# Patient Record
Sex: Male | Born: 1960 | Race: White | Hispanic: No | Marital: Married | Smoking: Former smoker
Health system: Southern US, Community
[De-identification: ages and names within clinical notes are randomized; demographics above are authoritative.]

## PROBLEM LIST (undated history)

## (undated) DIAGNOSIS — T8140XA Infection following a procedure, unspecified, initial encounter: Secondary | ICD-10-CM

## (undated) DIAGNOSIS — K219 Gastro-esophageal reflux disease without esophagitis: Secondary | ICD-10-CM

## (undated) DIAGNOSIS — T8149XA Infection following a procedure, other surgical site, initial encounter: Secondary | ICD-10-CM

## (undated) DIAGNOSIS — J302 Other seasonal allergic rhinitis: Secondary | ICD-10-CM

## (undated) DIAGNOSIS — Z98811 Dental restoration status: Secondary | ICD-10-CM

## (undated) DIAGNOSIS — F4310 Post-traumatic stress disorder, unspecified: Secondary | ICD-10-CM

## (undated) HISTORY — PX: TONSILLECTOMY: SUR1361

## (undated) HISTORY — DX: Post-traumatic stress disorder, unspecified: F43.10

---

## 1995-04-23 HISTORY — PX: BACK SURGERY: SHX140

## 2000-12-17 ENCOUNTER — Emergency Department (HOSPITAL_COMMUNITY): Admission: EM | Admit: 2000-12-17 | Discharge: 2000-12-17 | Payer: Self-pay | Admitting: Emergency Medicine

## 2000-12-17 ENCOUNTER — Encounter: Payer: Self-pay | Admitting: Emergency Medicine

## 2000-12-25 ENCOUNTER — Emergency Department (HOSPITAL_COMMUNITY): Admission: EM | Admit: 2000-12-25 | Discharge: 2000-12-25 | Payer: Self-pay | Admitting: Emergency Medicine

## 2003-07-25 ENCOUNTER — Emergency Department (HOSPITAL_COMMUNITY): Admission: EM | Admit: 2003-07-25 | Discharge: 2003-07-25 | Payer: Self-pay | Admitting: Emergency Medicine

## 2003-07-28 ENCOUNTER — Emergency Department (HOSPITAL_COMMUNITY): Admission: EM | Admit: 2003-07-28 | Discharge: 2003-07-29 | Payer: Self-pay | Admitting: Emergency Medicine

## 2003-08-12 ENCOUNTER — Ambulatory Visit (HOSPITAL_COMMUNITY): Admission: RE | Admit: 2003-08-12 | Discharge: 2003-08-12 | Payer: Self-pay | Admitting: Family Medicine

## 2003-08-17 ENCOUNTER — Encounter (HOSPITAL_COMMUNITY): Admission: RE | Admit: 2003-08-17 | Discharge: 2003-09-16 | Payer: Self-pay | Admitting: Family Medicine

## 2003-09-20 ENCOUNTER — Encounter (HOSPITAL_COMMUNITY): Admission: RE | Admit: 2003-09-20 | Discharge: 2003-10-20 | Payer: Self-pay | Admitting: Family Medicine

## 2004-03-28 ENCOUNTER — Ambulatory Visit (HOSPITAL_COMMUNITY): Admission: RE | Admit: 2004-03-28 | Discharge: 2004-03-28 | Payer: Self-pay | Admitting: Neurosurgery

## 2006-06-11 ENCOUNTER — Ambulatory Visit (HOSPITAL_COMMUNITY): Admission: RE | Admit: 2006-06-11 | Discharge: 2006-06-11 | Payer: Self-pay | Admitting: Family Medicine

## 2006-06-13 ENCOUNTER — Ambulatory Visit (HOSPITAL_COMMUNITY): Admission: RE | Admit: 2006-06-13 | Discharge: 2006-06-13 | Payer: Self-pay | Admitting: Family Medicine

## 2010-10-31 ENCOUNTER — Other Ambulatory Visit (HOSPITAL_COMMUNITY): Payer: Self-pay | Admitting: Family Medicine

## 2010-10-31 DIAGNOSIS — M25569 Pain in unspecified knee: Secondary | ICD-10-CM

## 2010-11-02 ENCOUNTER — Ambulatory Visit (HOSPITAL_COMMUNITY)
Admission: RE | Admit: 2010-11-02 | Discharge: 2010-11-02 | Disposition: A | Discharge status: Home / Self Care | Payer: BC Managed Care – PPO | Source: Ambulatory Visit | Attending: Family Medicine | Admitting: Family Medicine

## 2010-11-02 DIAGNOSIS — R937 Abnormal findings on diagnostic imaging of other parts of musculoskeletal system: Secondary | ICD-10-CM | POA: Insufficient documentation

## 2010-11-02 DIAGNOSIS — M25569 Pain in unspecified knee: Secondary | ICD-10-CM | POA: Insufficient documentation

## 2010-11-02 DIAGNOSIS — S82109A Unspecified fracture of upper end of unspecified tibia, initial encounter for closed fracture: Secondary | ICD-10-CM | POA: Insufficient documentation

## 2010-11-21 HISTORY — PX: KNEE ARTHROSCOPY: SUR90

## 2011-08-26 HISTORY — PX: KNEE SURGERY: SHX244

## 2011-09-21 DIAGNOSIS — T8140XA Infection following a procedure, unspecified, initial encounter: Secondary | ICD-10-CM

## 2011-09-21 HISTORY — DX: Infection following a procedure, unspecified, initial encounter: T81.40XA

## 2011-10-07 ENCOUNTER — Encounter (HOSPITAL_BASED_OUTPATIENT_CLINIC_OR_DEPARTMENT_OTHER): Payer: Self-pay | Admitting: *Deleted

## 2011-10-08 ENCOUNTER — Ambulatory Visit (HOSPITAL_BASED_OUTPATIENT_CLINIC_OR_DEPARTMENT_OTHER): Payer: BC Managed Care – PPO | Admitting: Anesthesiology

## 2011-10-08 ENCOUNTER — Encounter (HOSPITAL_BASED_OUTPATIENT_CLINIC_OR_DEPARTMENT_OTHER): Payer: Self-pay | Admitting: *Deleted

## 2011-10-08 ENCOUNTER — Encounter (HOSPITAL_BASED_OUTPATIENT_CLINIC_OR_DEPARTMENT_OTHER)
Admission: RE | Disposition: A | Discharge status: Home / Self Care | Payer: Self-pay | Source: Ambulatory Visit | Attending: Orthopedic Surgery

## 2011-10-08 ENCOUNTER — Encounter (HOSPITAL_BASED_OUTPATIENT_CLINIC_OR_DEPARTMENT_OTHER): Payer: Self-pay | Admitting: Anesthesiology

## 2011-10-08 ENCOUNTER — Encounter (HOSPITAL_BASED_OUTPATIENT_CLINIC_OR_DEPARTMENT_OTHER): Payer: Self-pay | Admitting: Orthopedic Surgery

## 2011-10-08 ENCOUNTER — Ambulatory Visit (HOSPITAL_COMMUNITY)
Admission: RE | Admit: 2011-10-08 | Discharge: 2011-10-08 | Disposition: A | Discharge status: Home / Self Care | Payer: BC Managed Care – PPO | Source: Ambulatory Visit | Attending: Orthopedic Surgery | Admitting: Orthopedic Surgery

## 2011-10-08 ENCOUNTER — Other Ambulatory Visit (HOSPITAL_BASED_OUTPATIENT_CLINIC_OR_DEPARTMENT_OTHER): Payer: Self-pay | Admitting: Orthopedic Surgery

## 2011-10-08 ENCOUNTER — Ambulatory Visit (HOSPITAL_BASED_OUTPATIENT_CLINIC_OR_DEPARTMENT_OTHER)
Admission: RE | Admit: 2011-10-08 | Discharge: 2011-10-08 | Disposition: A | Discharge status: Home / Self Care | Payer: BC Managed Care – PPO | Source: Ambulatory Visit | Attending: Orthopedic Surgery | Admitting: Orthopedic Surgery

## 2011-10-08 DIAGNOSIS — Z792 Long term (current) use of antibiotics: Secondary | ICD-10-CM

## 2011-10-08 DIAGNOSIS — F17200 Nicotine dependence, unspecified, uncomplicated: Secondary | ICD-10-CM | POA: Insufficient documentation

## 2011-10-08 DIAGNOSIS — T8149XA Infection following a procedure, other surgical site, initial encounter: Secondary | ICD-10-CM | POA: Diagnosis present

## 2011-10-08 DIAGNOSIS — K219 Gastro-esophageal reflux disease without esophagitis: Secondary | ICD-10-CM | POA: Insufficient documentation

## 2011-10-08 DIAGNOSIS — T8183XA Persistent postprocedural fistula, initial encounter: Secondary | ICD-10-CM | POA: Insufficient documentation

## 2011-10-08 DIAGNOSIS — T8140XA Infection following a procedure, unspecified, initial encounter: Secondary | ICD-10-CM | POA: Insufficient documentation

## 2011-10-08 DIAGNOSIS — Y834 Other reconstructive surgery as the cause of abnormal reaction of the patient, or of later complication, without mention of misadventure at the time of the procedure: Secondary | ICD-10-CM | POA: Insufficient documentation

## 2011-10-08 HISTORY — DX: Other seasonal allergic rhinitis: J30.2

## 2011-10-08 HISTORY — DX: Dental restoration status: Z98.811

## 2011-10-08 HISTORY — DX: Infection following a procedure, other surgical site, initial encounter: T81.49XA

## 2011-10-08 HISTORY — DX: Infection following a procedure, unspecified, initial encounter: T81.40XA

## 2011-10-08 HISTORY — DX: Gastro-esophageal reflux disease without esophagitis: K21.9

## 2011-10-08 HISTORY — PX: INCISION AND DRAINAGE OF WOUND: SHX1803

## 2011-10-08 SURGERY — IRRIGATION AND DEBRIDEMENT WOUND
Anesthesia: General | Site: Leg Lower | Laterality: Left

## 2011-10-08 MED ORDER — VANCOMYCIN HCL 500 MG IV SOLR
500.0000 mg | Freq: Once | INTRAVENOUS | Status: AC
  Administered 2011-10-08: 500 mg via INTRAVENOUS

## 2011-10-08 MED ORDER — PROMETHAZINE HCL 25 MG/ML IJ SOLN: 6.2500 mg | INTRAMUSCULAR | Status: DC | PRN

## 2011-10-08 MED ORDER — EPHEDRINE SULFATE 50 MG/ML IJ SOLN
INTRAMUSCULAR | Status: DC | PRN
  Administered 2011-10-08: 5 mg via INTRAVENOUS
  Administered 2011-10-08: 10 mg via INTRAVENOUS

## 2011-10-08 MED ORDER — DEXAMETHASONE SODIUM PHOSPHATE 4 MG/ML IJ SOLN
INTRAMUSCULAR | Status: DC | PRN
  Administered 2011-10-08: 10 mg via INTRAVENOUS

## 2011-10-08 MED ORDER — FENTANYL CITRATE 0.05 MG/ML IJ SOLN
INTRAMUSCULAR | Status: DC | PRN
  Administered 2011-10-08 (×2): 50 ug via INTRAVENOUS

## 2011-10-08 MED ORDER — LACTATED RINGERS IV SOLN
INTRAVENOUS | Status: DC
  Administered 2011-10-08 (×2): via INTRAVENOUS

## 2011-10-08 MED ORDER — OXYCODONE-ACETAMINOPHEN 5-500 MG PO CAPS: 1.0000 | ORAL_CAPSULE | ORAL | Status: DC | PRN

## 2011-10-08 MED ORDER — PROMETHAZINE HCL 25 MG PO TABS: 25.0000 mg | ORAL_TABLET | Freq: Four times a day (QID) | ORAL | Status: DC | PRN

## 2011-10-08 MED ORDER — MIDAZOLAM HCL 5 MG/5ML IJ SOLN
INTRAMUSCULAR | Status: DC | PRN
  Administered 2011-10-08: 2 mg via INTRAVENOUS

## 2011-10-08 MED ORDER — VANCOMYCIN HCL IN DEXTROSE 1-5 GM/200ML-% IV SOLN: 1000.0000 mg | Freq: Once | INTRAVENOUS | Status: DC

## 2011-10-08 MED ORDER — PROPOFOL 10 MG/ML IV EMUL
INTRAVENOUS | Status: DC | PRN
  Administered 2011-10-08: 300 mg via INTRAVENOUS

## 2011-10-08 MED ORDER — OXYCODONE-ACETAMINOPHEN 5-325 MG PO TABS
2.0000 | ORAL_TABLET | Freq: Once | ORAL | Status: AC | PRN
  Administered 2011-10-08: 2 via ORAL

## 2011-10-08 MED ORDER — MEPERIDINE HCL 25 MG/ML IJ SOLN: 6.2500 mg | INTRAMUSCULAR | Status: DC | PRN

## 2011-10-08 MED ORDER — MIDAZOLAM HCL 2 MG/2ML IJ SOLN: 0.5000 mg | Freq: Once | INTRAMUSCULAR | Status: DC | PRN

## 2011-10-08 MED ORDER — VANCOMYCIN HCL 1000 MG IV SOLR: 1500.0000 mg | INTRAVENOUS | Status: DC

## 2011-10-08 MED ORDER — HYDROMORPHONE HCL PF 1 MG/ML IJ SOLN
0.2500 mg | INTRAMUSCULAR | Status: DC | PRN
  Administered 2011-10-08 (×2): 0.5 mg via INTRAVENOUS

## 2011-10-08 MED ORDER — SODIUM CHLORIDE 0.9 % IR SOLN
Status: DC | PRN
  Administered 2011-10-08: 6000 mL

## 2011-10-08 MED ORDER — VANCOMYCIN HCL IN DEXTROSE 1-5 GM/200ML-% IV SOLN
1000.0000 mg | INTRAVENOUS | Status: AC
  Administered 2011-10-08: 1000 mg via INTRAVENOUS

## 2011-10-08 MED ORDER — ONDANSETRON HCL 4 MG/2ML IJ SOLN
INTRAMUSCULAR | Status: DC | PRN
  Administered 2011-10-08: 4 mg via INTRAVENOUS

## 2011-10-08 MED ORDER — VANCOMYCIN HCL 1000 MG IV SOLR: 1000.0000 mg | Freq: Two times a day (BID) | INTRAVENOUS | Status: DC

## 2011-10-08 SURGICAL SUPPLY — 68 items
BANDAGE ELASTIC 4 VELCRO ST LF (GAUZE/BANDAGES/DRESSINGS) IMPLANT
BANDAGE ELASTIC 6 VELCRO ST LF (GAUZE/BANDAGES/DRESSINGS) ×1 IMPLANT
BANDAGE ESMARK 6X9 LF (GAUZE/BANDAGES/DRESSINGS) IMPLANT
BLADE SURG 15 STRL LF DISP TIS (BLADE) ×1 IMPLANT
BLADE SURG 15 STRL SS (BLADE) ×2
BNDG CMPR 9X4 STRL LF SNTH (GAUZE/BANDAGES/DRESSINGS)
BNDG CMPR 9X6 STRL LF SNTH (GAUZE/BANDAGES/DRESSINGS)
BNDG COHESIVE 4X5 TAN STRL (GAUZE/BANDAGES/DRESSINGS) ×1 IMPLANT
BNDG ESMARK 4X9 LF (GAUZE/BANDAGES/DRESSINGS) IMPLANT
BNDG ESMARK 6X9 LF (GAUZE/BANDAGES/DRESSINGS)
CANISTER SUCTION 1200CC (MISCELLANEOUS) ×1 IMPLANT
CLOTH BEACON ORANGE TIMEOUT ST (SAFETY) ×2 IMPLANT
COVER TABLE BACK 60X90 (DRAPES) IMPLANT
CUFF TOURNIQUET SINGLE 18IN (TOURNIQUET CUFF) IMPLANT
CUFF TOURNIQUET SINGLE 34IN LL (TOURNIQUET CUFF) IMPLANT
DECANTER SPIKE VIAL GLASS SM (MISCELLANEOUS) IMPLANT
DRAPE EXTREMITY T 121X128X90 (DRAPE) ×2 IMPLANT
DRAPE INCISE IOBAN 66X45 STRL (DRAPES) IMPLANT
DRAPE U 20/CS (DRAPES) ×1 IMPLANT
DRAPE U-SHAPE 47X51 STRL (DRAPES) ×2 IMPLANT
DRSG PAD ABDOMINAL 8X10 ST (GAUZE/BANDAGES/DRESSINGS) ×1 IMPLANT
DURAPREP 26ML APPLICATOR (WOUND CARE) ×2 IMPLANT
ELECT REM PT RETURN 9FT ADLT (ELECTROSURGICAL) ×2
ELECTRODE REM PT RTRN 9FT ADLT (ELECTROSURGICAL) ×1 IMPLANT
GAUZE XEROFORM 1X8 LF (GAUZE/BANDAGES/DRESSINGS) IMPLANT
GLOVE BIOGEL PI IND STRL 8 (GLOVE) ×2 IMPLANT
GLOVE BIOGEL PI INDICATOR 8 (GLOVE) ×2
GLOVE ECLIPSE 6.5 STRL STRAW (GLOVE) ×1 IMPLANT
GLOVE ORTHO TXT STRL SZ7.5 (GLOVE) ×2 IMPLANT
GLOVE SURG ORTHO 8.0 STRL STRW (GLOVE) ×2 IMPLANT
GOWN PREVENTION PLUS XLARGE (GOWN DISPOSABLE) ×4 IMPLANT
GOWN PREVENTION PLUS XXLARGE (GOWN DISPOSABLE) ×2 IMPLANT
HANDPIECE INTERPULSE COAX TIP (DISPOSABLE) ×2
IMMOBILIZER KNEE 24 THIGH 36 (MISCELLANEOUS) IMPLANT
IMMOBILIZER KNEE 24 UNIV (MISCELLANEOUS)
NEEDLE HYPO 22GX1.5 SAFETY (NEEDLE) IMPLANT
NS IRRIG 1000ML POUR BTL (IV SOLUTION) ×1 IMPLANT
PACK ARTHROSCOPY DSU (CUSTOM PROCEDURE TRAY) IMPLANT
PACK BASIN DAY SURGERY FS (CUSTOM PROCEDURE TRAY) ×2 IMPLANT
PACK ENT DAY SURGERY (CUSTOM PROCEDURE TRAY) ×1 IMPLANT
PADDING CAST COTTON 6X4 STRL (CAST SUPPLIES) ×2 IMPLANT
PENCIL BUTTON HOLSTER BLD 10FT (ELECTRODE) ×2 IMPLANT
SET HNDPC FAN SPRY TIP SCT (DISPOSABLE) IMPLANT
SHEET MEDIUM DRAPE 40X70 STRL (DRAPES) IMPLANT
SLEEVE SCD COMPRESS KNEE MED (MISCELLANEOUS) IMPLANT
SPONGE GAUZE 4X4 12PLY (GAUZE/BANDAGES/DRESSINGS) ×2 IMPLANT
SPONGE LAP 18X18 X RAY DECT (DISPOSABLE) ×1 IMPLANT
SPONGE LAP 4X18 X RAY DECT (DISPOSABLE) ×1 IMPLANT
STOCKINETTE 4X48 STRL (DRAPES) IMPLANT
STOCKINETTE 6  STRL (DRAPES)
STOCKINETTE 6 STRL (DRAPES) IMPLANT
STOCKINETTE IMPERVIOUS LG (DRAPES) ×1 IMPLANT
SUT ETHILON 3 0 PS 1 (SUTURE) ×1 IMPLANT
SUT MNCRL AB 4-0 PS2 18 (SUTURE) IMPLANT
SUT VIC AB 0 CT1 27 (SUTURE)
SUT VIC AB 0 CT1 27XBRD ANBCTR (SUTURE) IMPLANT
SUT VIC AB 0 SH 27 (SUTURE) IMPLANT
SUT VIC AB 2-0 SH 27 (SUTURE)
SUT VIC AB 2-0 SH 27XBRD (SUTURE) IMPLANT
SUT VICRYL 3-0 CR8 SH (SUTURE) IMPLANT
SWAB CULTURE LIQ STUART DBL (MISCELLANEOUS) ×1 IMPLANT
SYR BULB 3OZ (MISCELLANEOUS) IMPLANT
SYR CONTROL 10ML LL (SYRINGE) IMPLANT
TOWEL OR 17X24 6PK STRL BLUE (TOWEL DISPOSABLE) ×2 IMPLANT
TUBE ANAEROBIC SPECIMEN COL (MISCELLANEOUS) ×1 IMPLANT
TUBE CONNECTING 20X1/4 (TUBING) ×3 IMPLANT
UNDERPAD 30X30 INCONTINENT (UNDERPADS AND DIAPERS) ×2 IMPLANT
YANKAUER SUCT BULB TIP NO VENT (SUCTIONS) ×2 IMPLANT

## 2011-10-08 NOTE — Transfer of Care (Signed)
Immediate Anesthesia Transfer of Care Note  Patient: Peter Mahoney  Procedure(s) Performed: Procedure(s) (LRB): IRRIGATION AND DEBRIDEMENT WOUND (Left)  Patient Location: PACU  Anesthesia Type: General  Level of Consciousness: awake, alert  and oriented  Airway & Oxygen Therapy: Patient Spontanous Breathing and Patient connected to face mask oxygen  Post-op Assessment: Report given to PACU RN and Post -op Vital signs reviewed and stable  Post vital signs: Reviewed and stable  Complications: No apparent anesthesia complications

## 2011-10-08 NOTE — Anesthesia Preprocedure Evaluation (Signed)
Anesthesia Evaluation  Patient identified by MRN, date of birth, ID band Patient awake    Reviewed: Allergy & Precautions, H&P , NPO status , Patient's Chart, lab work & pertinent test results  History of Anesthesia Complications Negative for: history of anesthetic complications  Airway Mallampati: I TM Distance: >3 FB Neck ROM: Full    Dental No notable dental hx. (+) Teeth Intact and Dental Advisory Given   Pulmonary Current Smoker,  breath sounds clear to auscultation  Pulmonary exam normal       Cardiovascular negative cardio ROS  Rhythm:Regular Rate:Normal     Neuro/Psych negative neurological ROS     GI/Hepatic Neg liver ROS, GERD-  Medicated and Controlled,  Endo/Other  negative endocrine ROS  Renal/GU negative Renal ROS     Musculoskeletal   Abdominal   Peds  Hematology negative hematology ROS (+)   Anesthesia Other Findings   Reproductive/Obstetrics                           Anesthesia Physical Anesthesia Plan  ASA: II  Anesthesia Plan: General   Post-op Pain Management:    Induction: Intravenous  Airway Management Planned: LMA  Additional Equipment:   Intra-op Plan:   Post-operative Plan:   Informed Consent: I have reviewed the patients History and Physical, chart, labs and discussed the procedure including the risks, benefits and alternatives for the proposed anesthesia with the patient or authorized representative who has indicated his/her understanding and acceptance.   Dental advisory given  Plan Discussed with: CRNA and Surgeon  Anesthesia Plan Comments: (Plan routine monitors, GA- LMA OK)        Anesthesia Quick Evaluation

## 2011-10-08 NOTE — Procedures (Signed)
Successful image guided placement of SL PICC to (R)brachial vein 40cm length with tip at SVC/RA  Pt tolerated procedure well. Ready for use.  Brayton El PA-C 10/08/2011 3:16 PM

## 2011-10-08 NOTE — H&P (Signed)
PREOPERATIVE H&P  Chief Complaint: post op wound left leg  HPI: Peter Mahoney is a 51 y.o. male who presents for preoperative history and physical approximately 3 weeks after having had a left hamstring repair at the fibular head with decompression of the peroneal nerve. He has had drainage for about a week, which was cultured in the office and demonstrated MRSA. He complains of ongoing moderate to severe pain, as well as serous sanguinous drainage with some purulence.  Past Medical History  Diagnosis Date  . Seasonal allergies   . GERD (gastroesophageal reflux disease)   . Post-operative infection 09/2011    left leg  . Dental crown present    Past Surgical History  Procedure Date  . Knee arthroscopy 11/2010    left knee - meniscus tear  . Knee surgery 08/26/2011    hamstring tendon tear left  . Back surgery 04/1995    L5-S1  . Tonsillectomy     as a child   History   Social History  . Marital Status: Married    Spouse Name: N/A    Number of Children: N/A  . Years of Education: N/A   Social History Main Topics  . Smoking status: Current Everyday Smoker -- 1.0 packs/day for 30 years    Types: Cigarettes  . Smokeless tobacco: Never Used  . Alcohol Use: Yes     occasionally  . Drug Use: No  . Sexually Active:    Other Topics Concern  . None   Social History Narrative  . None   History reviewed. No pertinent family history. Allergies  Allergen Reactions  . Adhesive (Tape) Other (See Comments)    SKIN IRRITATION   Prior to Admission medications   Medication Sig Start Date End Date Taking? Authorizing Provider  acetaminophen (TYLENOL) 325 MG tablet Take 650 mg by mouth every 6 (six) hours as needed.   Yes Historical Provider, MD  cetirizine (ZYRTEC) 10 MG tablet Take 10 mg by mouth daily.   Yes Historical Provider, MD  omeprazole (PRILOSEC) 40 MG capsule Take 40 mg by mouth daily.   Yes Historical Provider, MD  oxyCODONE-acetaminophen (TYLOX) 5-500 MG per capsule  Take 1 capsule by mouth every 4 (four) hours as needed.   Yes Historical Provider, MD     Positive ROS: All other systems have been reviewed and were otherwise negative with the exception of those mentioned in the HPI and as above.  Physical Exam: General: Alert, no acute distress Cardiovascular: No pedal edema Respiratory: No cyanosis, no use of accessory musculature GI: No organomegaly, abdomen is soft and non-tender Skin: No lesions in the area of chief complaint Neurologic: Sensation intact distally Psychiatric: Patient is competent for consent with normal mood and affect Lymphatic: No axillary or cervical lymphadenopathy  MUSCULOSKELETAL: Left knee has a wound that has erythema, with a sinus tract and some purulent drainage.  Assessment: post op wound infection, left leg  Plan: Plan for Procedure(s): IRRIGATION AND DEBRIDEMENT WOUND, skin, subcutaneous tissue, muscle, possible bone  The risks benefits and alternatives were discussed with the patient including but not limited to the risks of nonoperative treatment, versus surgical intervention including infection, bleeding, nerve injury,  blood clots, cardiopulmonary complications, morbidity, mortality, among others, and they were willing to proceed. We've also discussed the risks of osteomyelitis, the need for revision surgery, among others. We will also be planning for a PICC line, and IV antibiotics in the form of vancomycin which will begin today preoperatively. We have been  working to find a home health agency in his area that can help manage his antibiotics.  Eulas Post, MD 10/08/2011 7:28 AM

## 2011-10-08 NOTE — Op Note (Signed)
10/08/2011  10:57 AM  PATIENT:  Peter Mahoney    PRE-OPERATIVE DIAGNOSIS:  Infected left lateral knee wound  POST-OPERATIVE DIAGNOSIS:  Same  PROCEDURE:  IRRIGATION AND DEBRIDEMENT, SKIN, SUBCUTANEOUS TISSUE, MUSCLE, TENDON, LEFT LATERAL KNEE WOUND, 8 CM  SURGEON:  Eulas Post, MD  PHYSICIAN ASSISTANT: Janace Litten, OPA-C, present and scrubbed throughout the case, critical for completion in a timely fashion, and for retraction, instrumentation, and closure.  ANESTHESIA:   General  PREOPERATIVE INDICATIONS:  Peter Mahoney is a  51 y.o. male who had a previous surgical intervention with repair of a hamstring tendon and placement of a anchor into the fibular head with FiberWire in place, as well as peroneal nerve decompression done by Dr. Eulah Pont approximately 6 weeks ago, who subsequently developed infection that was draining out of his wound. He had cultures done in the office, that appeared to be oxacillin resistant staph aureus. He had ongoing drainage from his wound, as well as the development of a sinus tract. He was brought to surgery for treatment of his infection, and to try and prevent the development of osteomyelitis. He has also been ordered to have a PICC line placed, IV vancomycin administered, and I will also obtain a infectious disease consultation.  The risks benefits and alternatives were discussed with the patient preoperatively including but not limited to the risks of infection, bleeding, nerve injury, cardiopulmonary complications, the need for revision surgery, among others, and the patient was willing to proceed.  OPERATIVE IMPLANTS: none  OPERATIVE FINDINGS: There was a sinus tract that tracked immediately down to a blue FiberWire, which did not appear connected to the anchor. This was immediately underneath the skin, and obviously contaminated, and so this was removed. There was also a tiger stripe FiberWire going up and down the hamstring tendon, which was concerning,  as it went down to the fibular head, and I assume this was connected to the anchor. The sinus tract appeared to have a tendency to burrow down towards the fibular head, during exploration, although I did not remove this FiberWire, nor did I removed the anchor, nor did I take down his lateral fibular complex to fully expose the tract. Instead, I debrided and curetted through the tract itself, trying to take care to maintain his anatomic structures as intact as possible, hoping that this debridement and curettage would achieve a cure, because if not, a much more radical exposure may be necessary. I did however excise the sinus tract at the level of the skin and subcutaneous tissue.  OPERATIVE PROCEDURE: The patient was brought to the operating room and placed in the supine position. General anesthesia was administered. IV vancomycin was administered. He was turned into the lateral decubitus position an axillary roll was placed along with all bony prominences padded. The left lower extremity was prepped and draped in usual sterile fashion. Tourniquet was not utilized. Time out was performed. Incision was made through his previous incision, and the sinus tract at the midportion of the wound was exposed and excised completely. I used a curette, as well as a rongeur to debride skin, subcutaneous tissue, tendon of the hamstrings, as well as the sinus tract going down to the fibular head. The skin was excised with a scalpel. Once I had completed the debridement, I irrigated a total of 6 L using pulse lavage through the wound. The skin was then closed loosely with nylon suture. I did remove one of the FiberWire sutures, which did not appear attached to  the anchor, but left the rest intact. Sterile dressing was applied. The patient was awakened and returned to PACU in stable and satisfactory condition. He will receive a PICC line today, as well as IV vancomycin with home health, and infectious disease consultation. He may  need subsequent imaging studies of his fibula, including MRI, to evaluate for the involvement of possible osteomyelitis if his wound does not respond clinically.

## 2011-10-08 NOTE — Anesthesia Postprocedure Evaluation (Signed)
  Anesthesia Post-op Note  Patient: Peter Mahoney  Procedure(s) Performed: Procedure(s) (LRB): IRRIGATION AND DEBRIDEMENT WOUND (Left)  Patient Location: PACU  Anesthesia Type: General  Level of Consciousness: awake, alert  and oriented  Airway and Oxygen Therapy: Patient Spontanous Breathing  Post-op Pain: none  Post-op Assessment: Post-op Vital signs reviewed, Patient's Cardiovascular Status Stable, Respiratory Function Stable, Patent Airway, No signs of Nausea or vomiting and Pain level controlled  Post-op Vital Signs: Reviewed and stable  Complications: No apparent anesthesia complications

## 2011-10-08 NOTE — Discharge Instructions (Signed)
Drug Monitoring, Therapeutic Therapeutic drug monitoring is used for measuring the level of some medications as a way to determine the most effective dose and or to avoid toxicity. WHY IS IT IMPORTANT? The drugs that are monitored have some special features. Most of them work best over a small range. Below this range, the drug is not effective and the patient begins having symptoms again; above this range, the drug has bad or toxic side effects that you want to avoid. Since many people take more than one medication, there may be interactions between the drugs that affect the way the body absorbs or metabolizes one of them. Also, some patients do not take medications as prescribed or instructed. Monitoring identifies these cases. WHAT DRUGS ARE MONITORED? There are several categories of drugs that require monitoring. The following are some of those drugs. DRUG CATEGORY: Cardiac drugs  DRUGS IN THAT CATEGORY: Digoxin, quinidine, procainamide, N-acetyl-procainamide (a metabolite of procainamide)   TREATMENT USE: Heart failure, angina, arrhythmias  DRUG CATEGORY: Antibiotics  DRUGS IN THAT CATEGORY: Aminoglycosides (gentamicin, tobramycin, amikacin) Vancomycin, Chloramphenicol   TREATMENT USE: Infections with bacteria that are resistant to less toxic antibiotics  DRUG CATEGORY: Antiepileptics  DRUGS IN THAT CATEGORY: Phenobarbital, phenytoin, valproic acid, carbamazepine, ethosuximide, sometimes gabapentin, lamotrigine   TREATMENT USE: Epilepsy, prevention of seizures  DRUG CATEGORY: Bronchodilators  DRUGS IN THAT CATEGORY: Theophylline, caffeine   TREATMENT USE: Asthma, Chronic obstructive pulmonary disorder (COPD), neonatal apnea  DRUG CATEGORY: Immunosuppressants  DRUGS IN THAT CATEGORY: Cyclosporine, tacrolimus, sometimes sirolimus, mycophenolate mofetile   TREATMENT USE: Prevent rejection of transplanted organs  DRUG CATEGORY: Anti-cancer drugs  DRUGS IN THAT CATEGORY: Methotrexate     TREATMENT USE: Various cancers, rheumatoid arthritis, non-hodgkin's lymphomas, osteosarcoma, psoriasis  DRUG CATEGORY: Psychiatric drugs  DRUGS IN THAT CATEGORY: Lithium, desipramine, some antidepressants (imipramine, amitriptyline, nortriptyline, doxepin)   TREATMENT USE: bipolar disorder (manic depression), depression  HOW DOES TDM WORK? Through years of testing, the optimum blood level range for each drug has been determined. (Remember, though, that each of [place] is different and the best drug level for one person may be different from another person.) For most of the drugs listed, blood is drawn right before a dose is given. For some drugs, the blood collection will take place after the dose is given at a specific time (for example, 90 minutes later); this time will vary and may be as long as 8 hours after dosing). Drawing the blood at the correct time is very important. The amount of drug measured in the blood will be used to determine if a patient is getting the right amount of the medication and, in some cases, to calculate the amount of drug that will be given the next time. So, if you are scheduled to have your blood drawn for a drug level, make sure you know if you are to take the drug before or after the blood draw is done. If you forget and do the opposite (take the drug when you are supposed to wait), let someone in the caregiver's office or lab know before the blood is collected. FAQ 1. How does my caregiver determine how much drug to give me?There are many factors to consider. Some of them are your weight, body composition, age, temperature, gastric movement, nutrition, renal, liver, and heart conditions, burns, shock, and trauma.  2. What should I do if I forget to take my medication on time?Do not double your dose the next time. Consult your caregiver or pharmacist to find  out what you should do.  3. Can I monitor myself at home?No. Usually blood must be collected at specific times and  tests done on special laboratory equipment.     Post Anesthesia Home Care Instructions  Activity: Get plenty of rest for the remainder of the day. A responsible adult should stay with you for 24 hours following the procedure.  For the next 24 hours, DO NOT: -Drive a car -Advertising copywriter -Drink alcoholic beverages -Take any medication unless instructed by your physician -Make any legal decisions or sign important papers.  Meals: Start with liquid foods such as gelatin or soup. Progress to regular foods as tolerated. Avoid greasy, spicy, heavy foods. If nausea and/or vomiting occur, drink only clear liquids until the nausea and/or vomiting subsides. Call your physician if vomiting continues.  Special Instructions/Symptoms: Your throat may feel dry or sore from the anesthesia or the breathing tube placed in your throat during surgery. If this causes discomfort, gargle with warm salt water. The discomfort should disappear within 24 hours.   Call your surgeon if you experience:   1.  Fever over 101.0. 2.  Inability to urinate. 3.  Nausea and/or vomiting. 4.  Extreme swelling or bruising at the surgical site. 5.  Continued bleeding from the incision. 6.  Increased pain, redness or drainage from the incision. 7.  Problems related to your pain medication.

## 2011-10-09 ENCOUNTER — Encounter (HOSPITAL_BASED_OUTPATIENT_CLINIC_OR_DEPARTMENT_OTHER): Payer: Self-pay | Admitting: Orthopedic Surgery

## 2011-10-11 LAB — WOUND CULTURE: Gram Stain: NONE SEEN

## 2011-10-11 LAB — POCT HEMOGLOBIN-HEMACUE: Hemoglobin: 15 g/dL (ref 13.0–17.0)

## 2011-10-13 LAB — ANAEROBIC CULTURE

## 2011-10-14 ENCOUNTER — Ambulatory Visit (INDEPENDENT_AMBULATORY_CARE_PROVIDER_SITE_OTHER): Payer: BC Managed Care – PPO | Admitting: Internal Medicine

## 2011-10-14 ENCOUNTER — Encounter: Payer: Self-pay | Admitting: Internal Medicine

## 2011-10-14 VITALS — BP 109/74 | HR 84 | Temp 97.4°F | Wt 168.4 lb

## 2011-10-14 DIAGNOSIS — S82143A Displaced bicondylar fracture of unspecified tibia, initial encounter for closed fracture: Secondary | ICD-10-CM | POA: Insufficient documentation

## 2011-10-14 DIAGNOSIS — T8140XA Infection following a procedure, unspecified, initial encounter: Secondary | ICD-10-CM

## 2011-10-14 DIAGNOSIS — K219 Gastro-esophageal reflux disease without esophagitis: Secondary | ICD-10-CM

## 2011-10-14 DIAGNOSIS — J309 Allergic rhinitis, unspecified: Secondary | ICD-10-CM

## 2011-10-14 DIAGNOSIS — T8149XA Infection following a procedure, other surgical site, initial encounter: Secondary | ICD-10-CM

## 2011-10-14 DIAGNOSIS — J302 Other seasonal allergic rhinitis: Secondary | ICD-10-CM | POA: Insufficient documentation

## 2011-10-14 DIAGNOSIS — S82109A Unspecified fracture of upper end of unspecified tibia, initial encounter for closed fracture: Secondary | ICD-10-CM

## 2011-10-14 LAB — C-REACTIVE PROTEIN: CRP: 0.4 mg/dL (ref <0.60)
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
## 2011-10-15 LAB — SEDIMENTATION RATE: Sed Rate: 5 mm/hr (ref 0–16)

## 2011-10-25 ENCOUNTER — Encounter: Payer: Self-pay | Admitting: Internal Medicine

## 2011-10-29 ENCOUNTER — Ambulatory Visit (INDEPENDENT_AMBULATORY_CARE_PROVIDER_SITE_OTHER): Payer: BC Managed Care – PPO | Admitting: Internal Medicine

## 2011-10-29 ENCOUNTER — Telehealth: Payer: Self-pay | Admitting: *Deleted

## 2011-10-29 ENCOUNTER — Encounter: Payer: Self-pay | Admitting: Internal Medicine

## 2011-10-29 VITALS — BP 125/88 | HR 62 | Temp 98.2°F | Ht 72.0 in | Wt 166.5 lb

## 2011-10-29 DIAGNOSIS — T8140XA Infection following a procedure, unspecified, initial encounter: Secondary | ICD-10-CM

## 2011-10-29 DIAGNOSIS — T8149XA Infection following a procedure, other surgical site, initial encounter: Secondary | ICD-10-CM

## 2011-10-29 NOTE — Telephone Encounter (Signed)
Lab report received from [facility] today.  Handwritten note on report requested call from [facility].  RN reviewed MD's notes from this morning's OV.  Notes reflected that [facility] would continue until MD reviews lab work drawn at [facility] today.  Phone call to [facility] and spoke with Okey Regal.  Shared MD's comments from this morning's OV.  Pharmacy Tech repeated back these comments.

## 2011-10-29 NOTE — Progress Notes (Signed)
Patient ID: Peter Mahoney, male   DOB: 1960-05-05, 51 y.o.   MRN: [id number]    [facility] for Infectious Disease  Patient Active Problem List  Diagnosis  . Wound infection after surgery, left lateral leg  . GERD (gastroesophageal reflux disease)  . Seasonal allergies  . Tibial plateau fracture    Patient's Medications  New Prescriptions   No medications on file  Previous Medications   CETIRIZINE (ZYRTEC) 10 MG TABLET    Take 10 mg by mouth daily.   OMEPRAZOLE (PRILOSEC) 40 MG CAPSULE    Take 40 mg by mouth daily.   OXYCODONE-ACETAMINOPHEN (TYLOX) 5-500 MG PER CAPSULE    Take 1 capsule by mouth every 4 (four) hours as needed.   PENICILLIN V POTASSIUM (VEETID) 500 MG TABLET    Take 500 mg by mouth 4 (four) times daily.   PROMETHAZINE (PHENERGAN) 25 MG TABLET    Take 1 tablet (25 mg total) by mouth every 6 (six) hours as needed for nausea.   SODIUM CHLORIDE 0.9 % SOLN 250 ML WITH VANCOMYCIN 1000 MG SOLR 1,000 MG    Inject 1,000 mg into the vein every 12 (twelve) hours.  Modified Medications   No medications on file  Discontinued Medications   No medications on file    Subjective: Peter Mahoney is in for his followup visit. 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 He has not had any problems tolerating the vancomycin or his PICC. He started physical therapy one week ago. Three days ago he began to notice a little bit of swelling above his left knee and laterally around his incision site. He has not had any fever, chills or sweats.  Objective: Temp: 98.2 F (36.8 C) (07/09 1133) Temp src: Oral (07/09 1133) BP: 125/88 mmHg (07/09 1133) Pulse Rate: 62  (07/09 1133)  General: Is in good spirits Skin: His right arm PICC site appears normal Left knee: Surgical incision laterally is well healed but there he is swelling around the incision site with some fluctuance. There is  no redness or warmth. It is nontender. He does have a little bit of swelling of his distal thigh as well.  Lab Results Lab Results  Component Value Date   CRP 0.40 10/14/2011    Lab Results  Component Value Date   ESRSEDRATE 5 10/14/2011      Assessment: I'm not sure if the swelling reflex any smoldering infection. It could be related to increased activity in his physical therapy. A repeat his inflammatory markers and consider stopping vancomycin soon. I suggested he contact Dr. Eulah Pont to get his thoughts about the no swelling.  Plan: 1. Consider stopping vancomycin soon 2. Repeat sedimentation rate and C-reactive protein   Cliffton Asters, MD [facility] for Infectious Disease [facility] [phone] pager   [phone] cell 10/29/2011, 11:50 AM

## 2011-10-30 ENCOUNTER — Telehealth: Payer: Self-pay | Admitting: *Deleted

## 2011-10-30 ENCOUNTER — Telehealth: Payer: Self-pay | Admitting: Internal Medicine

## 2011-10-30 NOTE — Telephone Encounter (Signed)
Order from Dr. Orvan Falconer to stop the Vancomycin and pull PICC called to [facility] Pharmacist.  Pharmacist read back the order.

## 2011-10-30 NOTE — Telephone Encounter (Signed)
I spoke with Mr. Douse today. He saw Dr. Richardson Landry yesterday he who attempted to aspirate fluid from his knee but was unsuccessful. He was then given a steroid injection. His sedimentation rate and C-reactive protein are normal her relatively low. I think it is best at this point to stop the vancomycin and remove the PICC. I've asked him to call me between now and his next visit if he has any new problems to indicate possible worsening infection.  Lab Results  Component Value Date   CRP 0.74* 10/29/2011    Lab Results  Component Value Date   ESRSEDRATE 4 10/29/2011    Cliffton Asters, MD [facility]ious Disease [facility] [phone] pager   [phone] cell 10/30/2011, 1:43 PM

## 2011-12-10 ENCOUNTER — Encounter: Payer: Self-pay | Admitting: Internal Medicine

## 2011-12-10 ENCOUNTER — Ambulatory Visit (INDEPENDENT_AMBULATORY_CARE_PROVIDER_SITE_OTHER): Payer: BC Managed Care – PPO | Admitting: Internal Medicine

## 2011-12-10 VITALS — BP 127/77 | HR 71 | Temp 97.9°F | Ht 72.0 in | Wt 169.5 lb

## 2011-12-10 DIAGNOSIS — H9319 Tinnitus, unspecified ear: Secondary | ICD-10-CM | POA: Insufficient documentation

## 2011-12-10 NOTE — Progress Notes (Signed)
Patient ID: Peter Mahoney, male   DOB: [birth year], 51 y.o.   MRN: [id number]    [facility] for Infectious Disease  Patient Active Problem List  Diagnosis  . Wound infection after surgery, left lateral leg  . GERD (gastroesophageal reflux disease)  . Seasonal allergies  . Tibial plateau fracture  . Tinnitus    Patient's Medications  New Prescriptions   No medications on file  Previous Medications   CETIRIZINE (ZYRTEC) 10 MG TABLET    Take 10 mg by mouth daily.   OMEPRAZOLE (PRILOSEC) 40 MG CAPSULE    Take 40 mg by mouth daily.   OXYCODONE-ACETAMINOPHEN (TYLOX) 5-500 MG PER CAPSULE    Take 1 capsule by mouth every 4 (four) hours as needed.   PENICILLIN V POTASSIUM (VEETID) 500 MG TABLET    Take 500 mg by mouth 4 (four) times daily.   PROMETHAZINE (PHENERGAN) 25 MG TABLET    Take 1 tablet (25 mg total) by mouth every 6 (six) hours as needed for nausea.  Modified Medications   No medications on file  Discontinued Medications   No medications on file    Subjective: This is he is in for his routine followup visit. He completed 3 weeks of IV vancomycin on July 10 for his methicillin-resistant coagulase-negative staph infection of his left knee. He continues to have some very mild swelling but overall feels that his knee is doing better. He is participating in physical therapy 3 times weekly.  He notes that about one week after stopping vancomycin he began to notice some ringing in his right ear. He states that it occurs only at night before he goes to bed. In the past he has had some very brief and mild episodes of tinnitus but this is more noticeable and persistent. He has not had any hearing loss or pain in his ear.  Objective: Temp: 97.9 F (36.6 C) (08/20 1523) Temp src: Oral (08/20 1523) BP: 127/77 mmHg (08/20 1523) Pulse Rate: 71  (08/20 1523)  General: He is in good spirits and in no distress Skin: He has some red papules on his legs that he states are probably mosquito  bites Ears: His left canal and tympanic membrane are entirely normal. His right canal is a little bit of cerumen. His right tympanic membrane appears normal Lungs: Clear Cor: Regular S1-S2 no murmurs Left knee: He has less swelling and no obvious effusion. His left lateral incision is healing nicely without any evidence of infection. He has a persistent nontender scar/nodule at the distal end of the incision.  Lab Results Lab Results  Component Value Date   CRP 0.74* 10/29/2011    Lab Results  Component Value Date   ESRSEDRATE 4 10/29/2011      Assessment: I suspect that his infection has now been cured. I will continue observation off of antibiotics. There is a remote possibility that his tinnitus is related to his recent vancomycin therapy but the timing of onset is a little unusual in that it began one week after completing vancomycin. I suggested that if it worsens or is associated with any hearing loss that he see an ear nose and throat physician for formal evaluation and audiology testing.  Plan: 1. Continue observation off of antibiotics 2. Followup here as needed   Cliffton Asters, MD [facility] for Infectious Disease [facility] [phone] pager   [phone] cell 12/10/2011, 3:50 PM

## 2012-11-03 ENCOUNTER — Other Ambulatory Visit (HOSPITAL_COMMUNITY): Payer: Self-pay | Admitting: Internal Medicine

## 2012-11-03 ENCOUNTER — Ambulatory Visit (HOSPITAL_COMMUNITY)
Admission: RE | Admit: 2012-11-03 | Discharge: 2012-11-03 | Disposition: A | Discharge status: Home / Self Care | Payer: BC Managed Care – PPO | Source: Ambulatory Visit | Attending: Internal Medicine | Admitting: Internal Medicine

## 2012-11-03 DIAGNOSIS — F17200 Nicotine dependence, unspecified, uncomplicated: Secondary | ICD-10-CM | POA: Insufficient documentation

## 2012-11-03 DIAGNOSIS — R059 Cough, unspecified: Secondary | ICD-10-CM | POA: Insufficient documentation

## 2012-11-03 DIAGNOSIS — R1314 Dysphagia, pharyngoesophageal phase: Secondary | ICD-10-CM

## 2012-11-03 DIAGNOSIS — R0602 Shortness of breath: Secondary | ICD-10-CM

## 2012-11-03 DIAGNOSIS — R05 Cough: Secondary | ICD-10-CM | POA: Insufficient documentation

## 2012-11-04 ENCOUNTER — Ambulatory Visit (HOSPITAL_COMMUNITY)
Admission: RE | Admit: 2012-11-04 | Discharge: 2012-11-04 | Disposition: A | Discharge status: Home / Self Care | Payer: BC Managed Care – PPO | Source: Ambulatory Visit | Attending: Internal Medicine | Admitting: Internal Medicine

## 2012-11-04 DIAGNOSIS — R1314 Dysphagia, pharyngoesophageal phase: Secondary | ICD-10-CM

## 2012-11-10 ENCOUNTER — Encounter: Payer: Self-pay | Admitting: Cardiology

## 2012-11-10 ENCOUNTER — Ambulatory Visit (INDEPENDENT_AMBULATORY_CARE_PROVIDER_SITE_OTHER): Payer: BC Managed Care – PPO | Admitting: Cardiology

## 2012-11-10 VITALS — BP 116/78 | HR 77 | Ht 72.0 in | Wt 175.3 lb

## 2012-11-10 DIAGNOSIS — R0989 Other specified symptoms and signs involving the circulatory and respiratory systems: Secondary | ICD-10-CM

## 2012-11-10 DIAGNOSIS — F17200 Nicotine dependence, unspecified, uncomplicated: Secondary | ICD-10-CM

## 2012-11-10 DIAGNOSIS — Z720 Tobacco use: Secondary | ICD-10-CM

## 2012-11-10 NOTE — Patient Instructions (Addendum)
Your physician has requested that you have en exercise stress myoview. For further information please visit [email or web address] Please follow instruction sheet, as given.  Your physician recommends that you have lab work  D-DIMER   .Your physician recommends that you schedule a follow-up appointment  After myoview.

## 2012-11-11 LAB — D-DIMER, QUANTITATIVE: D-Dimer, Quant: 0.27 ug/mL-FEU (ref 0.00–0.48)

## 2012-11-12 ENCOUNTER — Encounter: Payer: Self-pay | Admitting: Cardiology

## 2012-11-12 ENCOUNTER — Telehealth: Payer: Self-pay | Admitting: *Deleted

## 2012-11-12 DIAGNOSIS — F17210 Nicotine dependence, cigarettes, uncomplicated: Secondary | ICD-10-CM | POA: Insufficient documentation

## 2012-11-12 DIAGNOSIS — R0609 Other forms of dyspnea: Secondary | ICD-10-CM | POA: Insufficient documentation

## 2012-11-12 NOTE — Assessment & Plan Note (Signed)
Disability based on the nature of his visit with smoking the main focus. I did not spend as much time as the physician counseling with him as will be done in the future. I did mention that if he smoking a pack a day it would like him to try to cut it would not go into any details.

## 2012-11-12 NOTE — Telephone Encounter (Signed)
Left  Message,on phone to call back

## 2012-11-12 NOTE — Progress Notes (Signed)
Patient ID: Peter Mahoney, male   DOB: [birth year], 52 y.o.   MRN: [id number]  PCP: Cassell Smiles., MD  Clinic Note: Chief Complaint  Patient presents with  . New patient    referred by Dr Sherwood Gambler for SOB, started getting SOB the second week of june.    HPI: Peter Mahoney is a 52 y.o. 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  Interval History: He is date of consultation for his shortness of breath. He actually went to his primary's office to evaluate him for redness in his left thigh for which he take his antibiotics. But while there he noted about for 5 weeks of progressively worsening shortness of breath with exertion it started gradually and has gotten worse. The episodes of dyspnea we'll then lastly 15-30 minutes. It always with exertion is not arrest on. This all started probably he says in the mid June (he believes the 17th). He says that it usually was just with cath being active and in and and a tiny really exerting himself but now simply just walk across the floor at work. Will make her short of breath and he says that when he gets bad he does feel more pressure in his chest. He says intermittently occurs with less exertion but usually if he does a certain amount he can tell this getting it worse. He says that he doesn't feel lightheaded or dizzy except when his heart condition his breath. No palpitations or rapid heart beats. He just notes that he feels tired and fatigued as well. His symptom occurs lingers for 15+ minutes before it finally goes away.  He denies any PND, orthopnea or edema. No syncope/near-syncope the symptoms, or TIA /amaurosis fugax symptoms. No melena, hematochezia hematuria. He says he tracking cluster vessel a while ago and is made to feel horrible with aching all over. It  is his last check was quite good.  Past Medical History  Diagnosis Date  . Seasonal allergies   . GERD (gastroesophageal reflux disease)   . Post-operative infection 09/2011    left leg  . Dental crown present   . Wound infection after surgery, left lateral leg 10/08/2011   Prior Cardiac Evaluation: Reportedly several years ago had a negative stress test performed for palpitations while seen at the [place] office.  Past Surgical History: Past Surgical History  Procedure Laterality Date  . Knee arthroscopy  11/2010    left knee - meniscus tear  . Knee surgery  08/26/2011    hamstring tendon tear left  . Back surgery  04/1995    L5-S1  . Tonsillectomy      as a child  . Incision and drainage of wound  10/08/2011    Procedure: IRRIGATION AND DEBRIDEMENT WOUND;  Surgeon: Eulas Post, MD;  Location: [facility];  Service: Orthopedics;  Laterality: Left;  left left post op wound   Allergies  Allergen Reactions  . Adhesive (Tape) Other (See Comments)    SKIN IRRITATION    Current Outpatient Prescriptions  Medication Sig Dispense Refill  . Acetaminophen (TYLENOL 8 HOUR PO) Take 1 tablet by mouth as needed.      . doxycycline (VIBRAMYCIN) 100 MG capsule Take 100 mg by mouth 2 (two) times daily.      Marland Kitchen erythromycin ophthalmic ointment Place 1 application into  the left eye at bedtime.      Marland Kitchen omeprazole (PRILOSEC) 40 MG capsule Take 40 mg by mouth daily.       No current facility-administered medications for this visit.    History   Social History  . Marital Status: Married    Spouse Name: [name]    Number of Children: 2  . Years of Education: 12   Occupational History  .      [employer]   Social History Main Topics  . Smoking status: Current Every Day Smoker -- 1.00 packs/day for 30 years    Types: Cigarettes  . Smokeless tobacco: Never Used  . Alcohol Use: 1.0 oz/week    2 drink(s) per week     Comment: occasionally  . Drug Use: No  . Sexually  Active: Not on file   Other Topics Concern  . Not on file   Social History Narrative   Grandfather 3.    He works as a Passenger transport manager. 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 Recently he has been short of breath.   Family History  Problem Relation Age of Onset  . Heart Problems Mother   . Stroke Mother   . Cancer - Lung Father   . Heart failure Maternal Grandmother   . Emphysema Maternal Grandfather   . Diabetes Paternal Grandmother   . Cancer Paternal Grandfather    ROS: A comprehensive Review of Systems - Negative except Pertinent positives above with mild but notable positives below. General ROS: positive for  - weight loss negative for - chills, fatigue, fever or malaise ENT ROS: positive for - Redness and itching of the left eye. Gastrointestinal ROS: positive for - Nausea but without vomiting.decreased appetite with weight loss  PHYSICAL EXAM BP 116/78  Pulse 77  Ht 6' (1.829 m)  Wt 175 lb 4.8 oz (79.516 kg)  BMI 23.77 kg/m2 General appearance: alert, cooperative, appears stated age, no distress and Otherwise healthy-appearing. Pleasant mood and affect. Well-nourished well-groomed. He does smell of cigarette smoke. Neck: no adenopathy, no carotid bruit, no JVD, supple, symmetrical, trachea midline and thyroid not enlarged, symmetric, no tenderness/mass/nodules Lungs: clear to auscultation bilaterally, normal percussion bilaterally and Minimal interstitial sounds. no W./R.//R. Heart: regular rate and rhythm, S1, S2 normal, no murmur, click, rub or gallop and normal apical impulse Abdomen: soft, non-tender; bowel sounds normal; no masses,  no organomegaly Extremities: extremities normal, atraumatic, no cyanosis or edema Pulses: 2+ and symmetric Skin: Skin color, texture, turgor normal. No rashes or lesions Neurologic: Grossly normal HEENT: [place]/AT, EOMI, MMM, the left eye has conjunctival injection. The previously noted chalazion is no longer  notable.  [id number]:[id number] today: Yes Rate: 77  , Rhythm:  NSR, normal ECG.;    Recent Labs: As of December 2013: LDL 124, H&H 15.6 and 40.3.  ASSESSMENT: 52 year old gentleman with a main risk factor of smoking cover presents today with progressively worsening episodes of exertional dyspnea.   DOE (dyspnea on exertion) Am concerned that his exertional dyspnea for a will to be an anginal equivalent. Submitted is there is an association with mild mild chest tightness when he is currently breath. It could also be to excise-induced asthma with some probable likelihood of smoking-related Airways disease.  I simply think the being a 52 year old male smoker with exertional dyspnea, his risks of cardiac disease RBC intermediate, therefore would like to proceed with that treadmill Myoview stress test to evaluate for evidence of ischemia. At all see him back after  the study is performed. If high risk and would just simply proceed to the cardiac catheterization lab. I discussed his options with him.  At least 2 no his lipids are relatively well controlled in last check.  Tobacco abuse Disability based on the nature of his visit with smoking the main focus. I did not spend as much time as the physician counseling with him as will be done in the future. I did mention that if he smoking a pack a day it would like him to try to cut it would not go into any details.   PLAN: Per problem list. Orders Placed This Encounter  Procedures  . D-Dimer, Quantitative  . Myocardial Perfusion Imaging    Standing Status: Future     Number of Occurrences:      Standing Expiration Date: 11/10/2013    Order Specific Question:  Where should this test be performed    Answer:  [facility]    Order Specific Question:  Type of stress    Answer:  Exercise    Order Specific Question:  Patient weight in lbs    Answer:  175  . EKG 12-Lead   Followup: 2 weeks  DAVID W. Herbie Baltimore, M.D., M.S. [facility] [address] [place], [place]  [address]  [phone] Pager # [phone]

## 2012-11-12 NOTE — Telephone Encounter (Signed)
Message copied by Tobin Chad on Thu Nov 12, 2012 10:07 AM ------      Message from: [facility], DAVID      Created: Wed Nov 11, 2012  7:59 AM       This value is negative - makes a blood clot to the lungs less likely.            Marykay Lex, MD       ------

## 2012-11-12 NOTE — Assessment & Plan Note (Addendum)
Am concerned that his exertional dyspnea for a will to be an anginal equivalent. Submitted is there is an association with mild mild chest tightness when he is currently breath. It could also be to excise-induced asthma with some probable likelihood of smoking-related Airways disease.  I simply think the being a 52 year old male smoker with exertional dyspnea, his risks of cardiac disease RBC intermediate, therefore would like to proceed with that treadmill Myoview stress test to evaluate for evidence of ischemia. At all see him back after the study is performed. If high risk and would just simply proceed to the cardiac catheterization lab. I discussed his options with him.  At least 2 no his lipids are relatively well controlled in last check.

## 2012-11-12 NOTE — Telephone Encounter (Signed)
Returning your call. °

## 2012-11-16 NOTE — Telephone Encounter (Signed)
Spoke to patient . Result given. Voiced understanding

## 2012-11-17 ENCOUNTER — Ambulatory Visit (HOSPITAL_COMMUNITY)
Admission: RE | Admit: 2012-11-17 | Discharge: 2012-11-17 | Disposition: A | Discharge status: Home / Self Care | Payer: BC Managed Care – PPO | Source: Ambulatory Visit | Attending: Cardiovascular Disease | Admitting: Cardiovascular Disease

## 2012-11-17 DIAGNOSIS — R0609 Other forms of dyspnea: Secondary | ICD-10-CM | POA: Insufficient documentation

## 2012-11-17 DIAGNOSIS — F17200 Nicotine dependence, unspecified, uncomplicated: Secondary | ICD-10-CM | POA: Insufficient documentation

## 2012-11-17 DIAGNOSIS — R079 Chest pain, unspecified: Secondary | ICD-10-CM | POA: Insufficient documentation

## 2012-11-17 DIAGNOSIS — R5381 Other malaise: Secondary | ICD-10-CM | POA: Insufficient documentation

## 2012-11-17 DIAGNOSIS — R5383 Other fatigue: Secondary | ICD-10-CM | POA: Insufficient documentation

## 2012-11-17 DIAGNOSIS — Z8249 Family history of ischemic heart disease and other diseases of the circulatory system: Secondary | ICD-10-CM | POA: Insufficient documentation

## 2012-11-17 DIAGNOSIS — R0989 Other specified symptoms and signs involving the circulatory and respiratory systems: Secondary | ICD-10-CM

## 2012-11-17 HISTORY — PX: NM MYOVIEW LTD: HXRAD82

## 2012-11-17 MED ORDER — TECHNETIUM TC 99M SESTAMIBI GENERIC - CARDIOLITE
30.0000 | Freq: Once | INTRAVENOUS | Status: AC | PRN
  Administered 2012-11-17: 30 via INTRAVENOUS

## 2012-11-17 MED ORDER — TECHNETIUM TC 99M SESTAMIBI GENERIC - CARDIOLITE
10.0000 | Freq: Once | INTRAVENOUS | Status: AC | PRN
  Administered 2012-11-17: 10 via INTRAVENOUS

## 2012-11-17 NOTE — Procedures (Addendum)
[facility] [address] [place] [address] [phone]  Cardiology Nuclear Med Study  Peter Mahoney is a 52 y.o. male     MRN : [id number]     DOB: October 12, 1960  Procedure Date: 11/17/2012  Nuclear Med Background Indication for Stress Test:  Evaluation for Ischemia History:  no prior history reported Cardiac Risk Factors: Family History - CAD and Smoker  Symptoms:  Chest Pain, DOE, Fatigue and SOB   Nuclear Pre-Procedure Caffeine/Decaff Intake:  1:00am NPO After: 10AM   IV Site: R Hand  IV 0.9% NS with Angio Cath:  22g  Chest Size (in):  42"  IV Started by: Emmit Pomfret, RN  Height: 6' (1.829 m)  Cup Size: n/a  BMI:  Body mass index is 23.73 kg/(m^2). Weight:  175 lb (79.379 kg)   Tech Comments:  N/A    Nuclear Med Study 1 or 2 day study: 1 day  Stress Test Type:  Stress  Order Authorizing Provider:  Garrette Lemma, MD    Resting Radionuclide: Technetium 71m Sestamibi  Resting Radionuclide Dose: 10.2 mCi   Stress Radionuclide:  Technetium 59m Sestamibi  Stress Radionuclide Dose: 31.1 mCi           Stress Protocol Rest HR: 78 Stress HR: 166  Rest BP: 129/89 Stress BP: 179/84  Exercise Time (min): n/a METS: n/a   Predicted Max HR: 170 bpm % Max HR: 97.65 bpm Rate Pressure Product: 21308  Dose of Adenosine (mg):  n/a Dose of Lexiscan: n/a mg  Dose of Atropine (mg): n/a Dose of Dobutamine: n/a mcg/kg/min (at max HR)  Stress Test Technologist: Esperanza Sheets, CCT Nuclear Technologist: Koren Shiver, CNMT   Rest Procedure:  Myocardial perfusion imaging was performed at rest 45 minutes following the intravenous administration of Technetium 83m Sestamibi. Stress Procedure:  The patient performed treadmill exercise using a Bruce  Protocol for 7 minutes. The patient stopped due to SOB and Fatigue and denied any chest pain.  There were no significant ST-T wave changes.  Technetium 28m Sestamibi was injected at peak  exercise and myocardial perfusion imaging was performed after a brief delay.  Transient Ischemic Dilatation (Normal <1.22):  0.76 Lung/Heart Ratio (Normal <0.45):  0.35 QGS EDV:  87 ml QGS ESV:  35 ml LV Ejection Fraction: 60%  Signed by  Koren Shiver, CNMT  PHYSICIAN INTERPRETATION  Rest ECG: NSR with non-specific ST-T wave changes  Stress ECG: No significant change from baseline ECG, No significant ST segment change suggestive of ischemia. and Insignificant upsloping ST segment depression.  QPS Raw Data Images:  Mild diaphragmatic attenuation.  Normal left ventricular size.  Resting images have significant amount of tracer uptake in the splanchnic viscera Stress Images:  There is decreased uptake in the septum. There basically appears to be tracer drop-out in the basal septal wall; this does not necessarily correlate to a vascular distribution and is seen in both rest and stress images. Rest Images:  There is decreased uptake in the septum.   Comparison with the stress images reveals no significant change.  Subtraction (SDS):  There is no evidence of scar or ischemia.  Resting and stress images do not adequately line up.  Impression Exercise Capacity:  Good exercise capacity. BP Response:  Normal blood pressure response. Almost 20 beat per minute recovery in the first minute Clinical Symptoms:  There is dyspnea. and fatigue ECG Impression:  No significant ST segment change suggestive of ischemia. Comparison with Prior  Nuclear Study: No previous nuclear study performed  Overall Impression:  Normal stress nuclear study. and Low risk stress nuclear study with tracer drop out in the septal wall noted in both rest and stress images.  With normal septal wall motion, this is likely artifact..  LV Wall Motion:  NL LV Function; NL Wall Motion   HARDING,DAVID W, MD  11/17/2012 4:44 PM

## 2012-11-24 ENCOUNTER — Encounter: Payer: Self-pay | Admitting: Cardiology

## 2012-11-24 ENCOUNTER — Ambulatory Visit (INDEPENDENT_AMBULATORY_CARE_PROVIDER_SITE_OTHER): Payer: BC Managed Care – PPO | Admitting: Cardiology

## 2012-11-24 VITALS — BP 128/88 | HR 95 | Ht 72.0 in | Wt 171.9 lb

## 2012-11-24 DIAGNOSIS — F17200 Nicotine dependence, unspecified, uncomplicated: Secondary | ICD-10-CM

## 2012-11-24 DIAGNOSIS — Z720 Tobacco use: Secondary | ICD-10-CM

## 2012-11-24 MED ORDER — VARENICLINE TARTRATE 0.5 MG X 11 & 1 MG X 42 PO MISC: ORAL | Status: DC

## 2012-11-24 MED ORDER — VARENICLINE TARTRATE 1 MG PO TABS: 1.0000 mg | ORAL_TABLET | Freq: Two times a day (BID) | ORAL | Status: DC

## 2012-11-24 NOTE — Patient Instructions (Addendum)
Start CHANTIX  Your physician wants you to follow-up in 6 month Dr Herbie Baltimore. You will receive a reminder letter in the mail two months in advance. If you don't receive a letter, please call our office to schedule the follow-up appointment.

## 2012-12-06 ENCOUNTER — Encounter: Payer: Self-pay | Admitting: Cardiology

## 2012-12-06 NOTE — Progress Notes (Signed)
Patient ID: Peter Mahoney, male   DOB: Aug 19, 1960, 52 y.o.   MRN: [id number] PCP: Cassell Smiles., MD  Clinic Note: Chief Complaint  Patient presents with  . Follow-up    Recent nuclear stress test for shortness of breath on exertion    HPI: Peter Mahoney is a 52 y.o. male current smoker and contract employee at [employer], with no prior cardiac or cardiovascular history, but with a mild family history of a mother with atrial fibrillation, who presents today to followup on her nuclear stress test ordered during his initial visit on July 22. He noted gradually worsening dyspnea on exertion over last several months that were concerning for possible anginal equivalent. He underwent treadmill Myoview stress test during which he exercised 7 minutes and stopped due to shortness of breath. There were no scintigraphic evidence of any significant ischemia or infarction. EF was 60%.  Interval History: He still notes shortness of breath with exertion, but has noted that his become a little less pronounced. He continues to deny any chest discomfort with rest or exertion.  He hasn't had a pickup his level of activity, and is actively interested in smoking cessation. He denies any wheezing or significant coughing although he does cough some in the morning. He denies any palpitations, lightheadedness or dizziness, no wooziness. No syncope or near syncope. He denies any PND, orthopnea or edema. No syncope/near-syncope the symptoms, or TIA /amaurosis fugax symptoms. No melena, hematochezia hematuria.   Past Medical History  Diagnosis Date  . Seasonal allergies   . GERD (gastroesophageal reflux disease)   . Post-operative infection 09/2011    left leg  . Dental crown present   . Wound infection after surgery, left lateral leg 10/08/2011   Prior Cardiac Evaluation: Reportedly several years ago had a negative stress test performed for palpitations while seen at the [place] office. He also reports intolerance  to cholesterol medications. Past Surgical History: Past Surgical History  Procedure Laterality Date  . Knee arthroscopy  11/2010    left knee - meniscus tear  . Knee surgery  08/26/2011    hamstring tendon tear left  . Back surgery  04/1995    L5-S1  . Tonsillectomy      as a child  . Incision and drainage of wound  10/08/2011    Procedure: IRRIGATION AND DEBRIDEMENT WOUND;  Surgeon: Eulas Post, MD;  Location: [facility];  Service: Orthopedics;  Laterality: Left;  left left post op wound  . Nm myoview ltd  11/17/2012    Exercise 7 minutes. EF 60%. No ischemia or infarction.; Low risk   Allergies  Allergen Reactions  . Adhesive [Tape] Other (See Comments)    SKIN IRRITATION    Current Outpatient Prescriptions  Medication Sig Dispense Refill  . Acetaminophen (TYLENOL 8 HOUR PO) Take 1 tablet by mouth as needed.      Marland Kitchen omeprazole (PRILOSEC) 20 MG capsule Take 20 mg by mouth daily.      . varenicline (CHANTIX CONTINUING MONTH PAK) 1 MG tablet Take 1 tablet (1 mg total) by mouth 2 (two) times daily.  30 tablet  4  . varenicline (CHANTIX STARTING MONTH PAK) 0.5 MG X 11 & 1 MG X 42 tablet Take one 0.5 mg tablet by mouth once daily for 3 days, then increase to one 0.5 mg tablet twice daily for 4 days, then increase to one 1 mg tablet twice daily.  53 tablet  0   No current facility-administered medications  for this visit.   History   Social History Narrative   Married father of 2, Grandfather of 3. Wife's name is [name].   He works as a Passenger transport manager for [employer] 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   Currently smokes a pack a day, and drinks maybe 2-3 alcohol beverages a week.    Family History  Problem Relation Age of Onset  . Heart Problems Mother   . Stroke Mother   . Cancer - Lung Father   . Heart failure Maternal Grandmother   . Emphysema Maternal Grandfather   . Diabetes Paternal Grandmother   . Cancer Paternal Grandfather    ROS:  A comprehensive Review of Systems - Negative except Pertinent positives above with mild but notable positives below. General ROS: positive for  - weight loss Gastrointestinal ROS: positive for - Nausea less notable  PHYSICAL EXAM BP 128/88  Pulse 95  Ht 6' (1.829 m)  Wt 171 lb 14.4 oz (77.973 kg)  BMI 23.31 kg/m2 General appearance: A&Ox3, cooperative, appears stated age, no distress and Otherwise healthy-appearing. Pleasant mood and affect. Well-nourished well-groomed. He does smell of cigarette smoke. Neck: no adenopathy, no carotid bruit, no JVD, supple, symmetrical, trachea midline and thyroid not enlarged, symmetric, no tenderness/mass/nodules Lungs: CTAB, normal percussion bilaterally and Minimal interstitial sounds. no W./R.//R. Heart: RRR, S1, S2 normal, no murmur, click, rub or gallop and normal apical impulse Abdomen: soft, non-tender; bowel sounds normal; no masses,  no organomegaly Extremities: extremities normal, atraumatic, no cyanosis or edema Pulses: 2+ and symmetric Skin: Skin color, texture, turgor normal. No rashes or lesions Neurologic: Grossly normal HEENT: [place]/AT, EOMI, MMM, the left eye has conjunctival injection.  OZH:YQMVHQION today: Yes Rate: 77  , Rhythm:  NSR, normal ECG.;    Recent Labs: As of December 2013: LDL 124, H&H 15.6 and 40.3.  ASSESSMENT/PLAN: 52 year old gentleman with a main risk factor of smoking cover presents today with progressively worsening episodes of exertional dyspnea.  Evaluated with a nuclear stress test found to be negative for ischemia.   DOE (dyspnea on exertion) With Myoview negative for macrovascular ischemia, large vessel ischemia is unlikely to be the cause of his dyspnea. His symptoms are most certainly multifactorial including: Primary pulmonary issues with his long-term smoking, deconditioning, potentially microvascular disease.  At this point we'll continue with risk factor modification, continue exercise and smoking  cessation.  Tobacco abuse We talked about 7-8 minutes about the need to quit smoking. He asked about medication options. He was tear is not Chantix, he reviewed the on Monday return understood the potential adverse effects but is okay with going forward with using it.  Plan: Set quit date, prescribe Chantix for 2 months.   Cardiac risk factor modification will be deferred to patient's primary physician. His blood pressure is well-controlled. Would monitor glucose levels and lipids.  Followup: Six-month  Jonica Bickhart W. Herbie Baltimore, M.D., M.S. [facility] [address] [place], [place]  [address]  [phone] Pager # [phone]

## 2012-12-06 NOTE — Assessment & Plan Note (Signed)
With Myoview negative for macrovascular ischemia, large vessel ischemia is unlikely to be the cause of his dyspnea. His symptoms are most certainly multifactorial including: Primary pulmonary issues with his long-term smoking, deconditioning, potentially microvascular disease.  At this point we'll continue with risk factor modification, continue exercise and smoking cessation.

## 2012-12-06 NOTE — Assessment & Plan Note (Signed)
We talked about 7-8 minutes about the need to quit smoking. He asked about medication options. He was tear is not Chantix, he reviewed the on Monday return understood the potential adverse effects but is okay with going forward with using it.  Plan: Set quit date, prescribe Chantix for 2 months.

## 2013-02-25 ENCOUNTER — Other Ambulatory Visit: Payer: Self-pay

## 2015-05-02 NOTE — H&P (Signed)
  NTS SOAP Note  Vital Signs:  Vitals as of: 123456: Systolic Q000111Q: Diastolic 81: Heart Rate 75: Temp 97.58F (Temporal): Height 61ft 0in: Weight 174Lbs 0 Ounces: BMI 23.6   BMI : 23.6 kg/m2  Subjective: This 55 year old male presents for of need for screening TCS.  Never has had one.  No lower gi complaints.  No family h/o colon cancer.  Review of Symptoms:  Constitutional:unremarkable   Head:unremarkable Eyes:unremarkable   Nose/Mouth/Throat:unremarkable Cardiovascular:  unremarkable Respiratory:unremarkable Gastrointestinal:  unremarkable   Genitourinary:unremarkable   Musculoskeletal:unremarkable Skin:unremarkable Hematolgic/Lymphatic:unremarkable   Allergic/Immunologic:unremarkable   Past Medical History:  Reviewed  Past Medical History  Surgical History: knee surgeries Medical Problems: reflux Allergies: nkda Medications: aciphex   Social History:Reviewed  Social History  Preferred Language: English Race:  White Ethnicity: Not Hispanic / Latino Age: 56 year Marital Status:  M Alcohol: socially   Smoking Status: Current every day smoker reviewed on 05/02/2015 Started Date:  Packs per day: 1.00 Functional Status reviewed on 05/02/2015 ------------------------------------------------ Bathing: Normal Cooking: Normal Dressing: Normal Driving: Normal Eating: Normal Managing Meds: Normal Oral Care: Normal Shopping: Normal Toileting: Normal Transferring: Normal Walking: Normal Cognitive Status reviewed on 05/02/2015 ------------------------------------------------ Attention: Normal Decision Making: Normal Language: Normal Memory: Normal Motor: Normal Perception: Normal Problem Solving: Normal Visual and Spatial: Normal   Family History:Reviewed  Family Health History Mother, Deceased; Heart disease;  Father, Deceased; Cancer unspecified;     Objective Information: General:Well appearing, well nourished in no  distress. Heart:RRR, no murmur Lungs:  CTA bilaterally, no wheezes, rhonchi, rales.  Breathing unlabored. Abdomen:Soft, NT/ND, no HSM, no masses. deferred to procedure  Assessment:Need for screening TCS  Diagnoses: V76.51  Z12.11 Screening for malignant neoplasm of colon (Encounter for screening for malignant neoplasm of colon)  Procedures: [id number] - OFFICE OUTPATIENT NEW 20 MINUTES    Plan:  Scheduled for screening TCS on 05/12/15.   Patient Education:Alternative treatments to surgery were discussed with patient (and family).  Risks and benefits  of procedure incluidng bleeding and perforation were fully explained to the patient (and family) who gave informed consent. Patient/family questions were addressed.  Follow-up:Pending Surgery

## 2015-05-10 ENCOUNTER — Ambulatory Visit (HOSPITAL_COMMUNITY)
Admission: RE | Admit: 2015-05-10 | Discharge: 2015-05-10 | Disposition: A | Discharge status: Home / Self Care | Payer: BLUE CROSS/BLUE SHIELD | Source: Ambulatory Visit | Attending: Family Medicine | Admitting: Family Medicine

## 2015-05-10 ENCOUNTER — Other Ambulatory Visit (HOSPITAL_COMMUNITY): Payer: Self-pay | Admitting: Family Medicine

## 2015-05-10 DIAGNOSIS — Z0189 Encounter for other specified special examinations: Secondary | ICD-10-CM | POA: Diagnosis present

## 2015-05-10 DIAGNOSIS — R05 Cough: Secondary | ICD-10-CM | POA: Diagnosis not present

## 2015-05-10 DIAGNOSIS — Z0000 Encounter for general adult medical examination without abnormal findings: Secondary | ICD-10-CM | POA: Insufficient documentation

## 2015-05-10 DIAGNOSIS — R918 Other nonspecific abnormal finding of lung field: Secondary | ICD-10-CM | POA: Diagnosis not present

## 2015-05-12 ENCOUNTER — Encounter (HOSPITAL_COMMUNITY): Payer: Self-pay

## 2015-05-12 ENCOUNTER — Ambulatory Visit (HOSPITAL_COMMUNITY)
Admission: RE | Admit: 2015-05-12 | Discharge: 2015-05-12 | Disposition: A | Discharge status: Home / Self Care | Payer: BLUE CROSS/BLUE SHIELD | Source: Ambulatory Visit | Attending: General Surgery | Admitting: General Surgery

## 2015-05-12 ENCOUNTER — Encounter (HOSPITAL_COMMUNITY)
Admission: RE | Disposition: A | Discharge status: Home / Self Care | Payer: Self-pay | Source: Ambulatory Visit | Attending: General Surgery

## 2015-05-12 DIAGNOSIS — K219 Gastro-esophageal reflux disease without esophagitis: Secondary | ICD-10-CM | POA: Diagnosis not present

## 2015-05-12 DIAGNOSIS — Z1211 Encounter for screening for malignant neoplasm of colon: Secondary | ICD-10-CM | POA: Insufficient documentation

## 2015-05-12 DIAGNOSIS — Z79899 Other long term (current) drug therapy: Secondary | ICD-10-CM | POA: Diagnosis not present

## 2015-05-12 HISTORY — PX: COLONOSCOPY: SHX5424

## 2015-05-12 SURGERY — COLONOSCOPY
Anesthesia: Moderate Sedation

## 2015-05-12 MED ORDER — MIDAZOLAM HCL 5 MG/5ML IJ SOLN
INTRAMUSCULAR | Status: DC | PRN
  Administered 2015-05-12: 1 mg via INTRAVENOUS
  Administered 2015-05-12: 4 mg via INTRAVENOUS

## 2015-05-12 MED ORDER — SODIUM CHLORIDE 0.9 % IV SOLN
INTRAVENOUS | Status: DC
  Administered 2015-05-12: 07:00:00 via INTRAVENOUS

## 2015-05-12 MED ORDER — MEPERIDINE HCL 50 MG/ML IJ SOLN
INTRAMUSCULAR | Status: AC
  Filled 2015-05-12: qty 1

## 2015-05-12 MED ORDER — STERILE WATER FOR IRRIGATION IR SOLN
Status: DC | PRN
  Administered 2015-05-12: 2.5 mL

## 2015-05-12 MED ORDER — MIDAZOLAM HCL 5 MG/5ML IJ SOLN
INTRAMUSCULAR | Status: AC
  Filled 2015-05-12: qty 10

## 2015-05-12 MED ORDER — MEPERIDINE HCL 50 MG/ML IJ SOLN
INTRAMUSCULAR | Status: DC | PRN
  Administered 2015-05-12: 50 mg via INTRAVENOUS

## 2015-05-12 NOTE — Discharge Instructions (Signed)
Colonoscopy, Care After °Refer to this sheet in the next few weeks. These instructions provide you with information on caring for yourself after your procedure. Your health care provider may also give you more specific instructions. Your treatment has been planned according to current medical practices, but problems sometimes occur. Call your health care provider if you have any problems or questions after your procedure. °WHAT TO EXPECT AFTER THE PROCEDURE  °After your procedure, it is typical to have the following: °· A small amount of blood in your stool. °· Moderate amounts of gas and mild abdominal cramping or bloating. °HOME CARE INSTRUCTIONS °· Do not drive, operate machinery, or sign important documents for 24 hours. °· You may shower and resume your regular physical activities, but move at a slower pace for the first 24 hours. °· Take frequent rest periods for the first 24 hours. °· Walk around or put a warm pack on your abdomen to help reduce abdominal cramping and bloating. °· Drink enough fluids to keep your urine clear or pale yellow. °· You may resume your normal diet as instructed by your health care provider. Avoid heavy or fried foods that are hard to digest. °· Avoid drinking alcohol for 24 hours or as instructed by your health care provider. °· Only take over-the-counter or prescription medicines as directed by your health care provider. °· If a tissue sample (biopsy) was taken during your procedure: °¨ Do not take aspirin or blood thinners for 7 days, or as instructed by your health care provider. °¨ Do not drink alcohol for 7 days, or as instructed by your health care provider. °¨ Eat soft foods for the first 24 hours. °SEEK MEDICAL CARE IF: °You have persistent spotting of blood in your stool 2-3 days after the procedure. °SEEK IMMEDIATE MEDICAL CARE IF: °· You have more than a small spotting of blood in your stool. °· You pass large blood clots in your stool. °· Your abdomen is swollen  (distended). °· You have nausea or vomiting. °· You have a fever. °· You have increasing abdominal pain that is not relieved with medicine. °  °This information is not intended to replace advice given to you by your health care provider. Make sure you discuss any questions you have with your health care provider. °  °Document Released: 11/21/2003 Document Revised: 01/27/2013 Document Reviewed: 12/14/2012 °Elsevier Interactive Patient Education ©2016 Elsevier Inc. ° °

## 2015-05-12 NOTE — Op Note (Signed)
[facility] [address] [place], [address]   COLONOSCOPY PROCEDURE REPORT     EXAM DATE: 05/28/2015  PATIENT NAME:      Peter Mahoney, Peter Mahoney           MR #:      [id number] BIRTHDATE:       Feb 02, 1961      VISIT #:     [phone]  ATTENDING:     Aviva Signs, MD     STATUS:     outpatient ASSISTANT:  INDICATIONS:  The patient is a 55 yr old male here for a colonoscopy due to average risk patient for colon cancer. PROCEDURE PERFORMED:     Colonoscopy, screening MEDICATIONS:     Demerol 50 mg IV and Versed 5 mg IV ESTIMATED BLOOD LOSS:     None  CONSENT: The patient understands the risks and benefits of the procedure and understands that these risks include, but are not limited to: sedation, allergic reaction, infection, perforation and/or bleeding. Alternative means of evaluation and treatment include, among others: physical exam, x-rays, and/or surgical intervention. The patient elects to proceed with this endoscopic procedure.  DESCRIPTION OF PROCEDURE: During intra-op preparation period all mechanical & medical equipment was checked for proper function. Hand hygiene and appropriate measures for infection prevention was taken. After the risks, benefits and alternatives of the procedure were thoroughly explained, Informed consent was verified, confirmed and timeout was successfully executed by the treatment team. A digital exam revealed no abnormalities of the rectum. The [id number] endoscope was introduced through the anus and advanced to the cecum, which was identified by both the appendix and ileocecal valve. adequate (Trilyte was used) The instrument was then slowly withdrawn as the colon was fully examined.Estimated blood loss is zero unless otherwise noted in this procedure report.   COLON FINDINGS: A normal appearing cecum, ileocecal valve, and appendiceal orifice were identified.  The ascending, transverse, descending, sigmoid colon, and  rectum appeared unremarkable. Retroflexed views revealed no abnormalities. The scope was then completely withdrawn from the patient and the procedure terminated.  SCOPE WITHDRAWAL TIME: 6    ADVERSE EVENTS:      There were no immediate complications.  IMPRESSIONS:     Normal colonoscopy  RECOMMENDATIONS:     Repeat Colonscopy in 10 years. RECALL:  _____________________________ Aviva Signs, MD eSigned:  Aviva Signs, MD 05-28-2015 7:46 AM   cc:   CPT CODES: ICD CODES:  The ICD and CPT codes recommended by this software are interpretations from the data that the clinical staff has captured with the software.  The verification of the translation of this report to the ICD and CPT codes and modifiers is the sole responsibility of the health care institution and practicing physician where this report was generated.  [employer] will not be held responsible for the validity of the ICD and CPT codes included on this report.  AMA assumes no liability for data contained or not contained herein. CPT is a Designer, television/film set of the [employer].

## 2015-05-12 NOTE — Interval H&P Note (Signed)
History and Physical Interval Note:  05/12/2015 7:27 AM  Peter Mahoney  has presented today for surgery, with the diagnosis of screening  The various methods of treatment have been discussed with the patient and family. After consideration of risks, benefits and other options for treatment, the patient has consented to  Procedure(s): COLONOSCOPY (N/A) as a surgical intervention .  The patient's history has been reviewed, patient examined, no change in status, stable for surgery.  I have reviewed the patient's chart and labs.  Questions were answered to the patient's satisfaction.     Aviva Signs A

## 2015-05-17 ENCOUNTER — Encounter (HOSPITAL_COMMUNITY): Payer: Self-pay | Admitting: General Surgery

## 2015-05-19 ENCOUNTER — Encounter: Payer: Self-pay | Admitting: Internal Medicine

## 2015-05-19 ENCOUNTER — Ambulatory Visit (INDEPENDENT_AMBULATORY_CARE_PROVIDER_SITE_OTHER): Payer: BLUE CROSS/BLUE SHIELD | Admitting: Internal Medicine

## 2015-05-19 VITALS — BP 124/80 | HR 66 | Ht 72.0 in | Wt 179.0 lb

## 2015-05-19 DIAGNOSIS — F17210 Nicotine dependence, cigarettes, uncomplicated: Secondary | ICD-10-CM

## 2015-05-19 DIAGNOSIS — J8410 Pulmonary fibrosis, unspecified: Secondary | ICD-10-CM | POA: Diagnosis not present

## 2015-05-19 NOTE — Progress Notes (Signed)
Subjective:    Patient ID: Peter Mahoney, male    DOB: 1960-07-26,    MRN: [id number]  HPI  19 yowm active smoker with new doe x 2014 p knee surgery with / copd on cxr at that point referred to [facility] 05/19/2015  By Peter Mahoney with ? PF also   05/19/2015 1st Peter Mahoney office visit/ Peter Mahoney   Chief Complaint  Patient presents with  . Pulmonay Consult    Referred by Peter Mahoney for eval of abnormal cxr. Pt states cxr was done to eval SOB. He states he has had some SOB on and off since 2014. He has some cough in the am with clear sputum.   am cough x 5 min > minimal mucus= clear/ not worse in winter or particular seasons  Walks up to 2 miles a day all flat s difficulty  No exp to chemo/ amio or h/o connective tissue dz   No obvious other  day to day or daytime variabilty or assoc   cp or chest tightness, subjective wheeze or overt sinus symptoms. No unusual exp hx or h/o childhood pna/ asthma or knowledge of premature birth.  Sleeping ok without nocturnal  or early am exacerbation  of respiratory  c/o's or need for noct saba. Also denies any obvious fluctuation of symptoms with weather or environmental changes or other aggravating or alleviating factors except as outlined above   Current Medications, Allergies, Complete Past Medical History, Past Surgical History, Family History, and Social History were reviewed in [employer] record.            Review of Systems  Constitutional: Negative for fever, chills, activity change, appetite change and unexpected weight change.  HENT: Negative for congestion, dental problem, postnasal drip, rhinorrhea, sneezing, sore throat, trouble swallowing and voice change.   Eyes: Negative for visual disturbance.  Respiratory: Positive for cough and shortness of breath. Negative for choking.   Cardiovascular: Negative for chest pain and leg swelling.  Gastrointestinal: Negative for nausea, vomiting and  abdominal pain.  Genitourinary: Negative for difficulty urinating.       Acid Heartburn  Indigestion  Musculoskeletal: Negative for arthralgias.  Skin: Negative for rash.  Psychiatric/Behavioral: Negative for behavioral problems and confusion.       Objective:   Physical Exam  amb wm nad  Wt Readings from Last 3 Encounters:  05/19/15 179 lb (81.194 kg)  05/12/15 175 lb (79.379 kg)  11/24/12 171 lb 14.4 oz (77.973 kg)    Vital signs reviewed    HEENT: nl dentition, turbinates, and oropharynx. Nl external ear canals without cough reflex   NECK :  without JVD/Nodes/TM/ nl carotid upstrokes bilaterally   LUNGS: no acc muscle use,  Nl contour chest which is clear to A and P bilaterally without cough on insp or exp maneuvers   CV:  RRR  no s3 or murmur or increase in P2, no edema   ABD:  soft and nontender with nl inspiratory excursion in the supine position. No bruits or organomegaly, bowel sounds nl  MS:  Nl gait/ ext warm without deformities, calf tenderness, cyanosis or clubbing No obvious joint restrictions   SKIN: warm and dry without lesions    NEURO:  alert, approp, nl sensorium with  no motor deficits      I personally reviewed images and agree with radiology impression as follows:  CXR:  05/09/14 Diffuse bilateral Mahoney interstitial prominence. This may be related to chronic interstitial lung disease  however an active process including interstitial pneumonitis cannot be excluded .        Assessment & Plan:

## 2015-05-19 NOTE — Patient Instructions (Addendum)
The key is to stop smoking completely before smoking completely stops you!    Please schedule a follow up office visit in 6 weeks, call sooner if needed with pfts on return. Add make sure taking ppi qd ac not prn

## 2015-05-20 NOTE — Assessment & Plan Note (Addendum)
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   In active  smokers Langerhan's Cell  Histiocyctosis (eosinophilic granuomatosis),  DIP,  and Respiratory Bronchiolitis ILD also need to be considered,  And definitely apply here so critical that he stop smoking now.   Also, Use of PPI is associated with improved survival time and with decreased radiologic fibrosis per King's study published in [facility] vol 184 p1390.  Dec 2011 and also may have other beneficial effects as per the latest review in [place] vol 193 [id number] Jun 20016.  This may not always be cause and effect, but given how universally unimpressive and expensive  all the other  Drugs developed to day  have been for pf,   rec maintaint on  ppi / diet/ lifestyle modification and f/u with serial walking sats and lung volumes for now to put more points on the curve / establish firm baseline before considering additional measures.   Total time devoted to counseling  = 35/57m review case with pt/wife [name] and discussion of options/alternatives/ personally creating in presence of pt  then going over specific  Instructions directly with the pt including how to use all of the meds but in particular covering each new medication in detail (see avs)

## 2015-05-20 NOTE — Assessment & Plan Note (Signed)

## 2015-05-22 ENCOUNTER — Telehealth: Payer: Self-pay | Admitting: *Deleted

## 2015-05-22 NOTE — Telephone Encounter (Signed)
Spoke with pt. He is aware of how he needs to take his medication. Nothing further was needed.

## 2015-05-22 NOTE — Telephone Encounter (Signed)
LMTCB

## 2015-05-22 NOTE — Telephone Encounter (Signed)
-----   Message from Tanda Rockers, MD sent at 05/20/2015  7:42 AM EST ----- Be sure he understands he should use aciphex 20 mg Take 30-60 min before first meal of the day (not prn)

## 2015-05-22 NOTE — Telephone Encounter (Signed)
Pt returning call.Peter Mahoney ° °

## 2015-07-25 ENCOUNTER — Ambulatory Visit: Payer: BLUE CROSS/BLUE SHIELD | Admitting: Internal Medicine

## 2015-08-29 ENCOUNTER — Ambulatory Visit: Payer: BLUE CROSS/BLUE SHIELD | Admitting: Internal Medicine

## 2015-08-29 ENCOUNTER — Encounter: Payer: BLUE CROSS/BLUE SHIELD | Admitting: Internal Medicine

## 2015-10-23 ENCOUNTER — Other Ambulatory Visit: Payer: Self-pay | Admitting: Internal Medicine

## 2015-10-23 DIAGNOSIS — R0600 Dyspnea, unspecified: Secondary | ICD-10-CM

## 2015-10-25 ENCOUNTER — Encounter: Payer: Self-pay | Admitting: Internal Medicine

## 2015-10-25 ENCOUNTER — Ambulatory Visit (INDEPENDENT_AMBULATORY_CARE_PROVIDER_SITE_OTHER): Payer: BLUE CROSS/BLUE SHIELD | Admitting: Internal Medicine

## 2015-10-25 VITALS — BP 112/70 | HR 66 | Ht 71.0 in | Wt 174.0 lb

## 2015-10-25 DIAGNOSIS — J8410 Pulmonary fibrosis, unspecified: Secondary | ICD-10-CM

## 2015-10-25 DIAGNOSIS — R0600 Dyspnea, unspecified: Secondary | ICD-10-CM

## 2015-10-25 DIAGNOSIS — Z720 Tobacco use: Secondary | ICD-10-CM | POA: Diagnosis not present

## 2015-10-25 DIAGNOSIS — F17210 Nicotine dependence, cigarettes, uncomplicated: Secondary | ICD-10-CM

## 2015-10-25 LAB — PULMONARY FUNCTION TEST
DL/VA % pred: 55 %
DL/VA: 2.62 ml/min/mmHg/L
DLCO COR: 16.34 ml/min/mmHg
DLCO UNC % PRED: 49 %
DLCO cor % pred: 48 %
DLCO unc: 16.79 ml/min/mmHg
FEF 25-75 PRE: 2.87 L/s
FEF 25-75 Post: 2.84 L/sec
FEF2575-%Change-Post: -1 %
FEF2575-%PRED-PRE: 84 %
FEF2575-%Pred-Post: 83 %
FEV1-%CHANGE-POST: 0 %
FEV1-%PRED-POST: 90 %
FEV1-%PRED-PRE: 91 %
FEV1-POST: 3.6 L
FEV1-Pre: 3.61 L
FEV1FVC-%CHANGE-POST: 1 %
FEV1FVC-%PRED-PRE: 98 %
FEV6-%CHANGE-POST: -1 %
FEV6-%PRED-PRE: 94 %
FEV6-%Pred-Post: 93 %
FEV6-POST: 4.63 L
FEV6-PRE: 4.7 L
FEV6FVC-%Change-Post: 0 %
FEV6FVC-%PRED-POST: 103 %
FEV6FVC-%Pred-Pre: 103 %
FVC-%Change-Post: -1 %
FVC-%Pred-Post: 91 %
FVC-%Pred-Pre: 92 %
FVC-PRE: 4.75 L
FVC-Post: 4.68 L
POST FEV6/FVC RATIO: 99 %
PRE FEV1/FVC RATIO: 76 %
Post FEV1/FVC ratio: 77 %
Pre FEV6/FVC Ratio: 99 %
RV % pred: 89 %
RV: 1.94 L
TLC % PRED: 92 %
TLC: 6.61 L

## 2015-10-25 NOTE — Progress Notes (Signed)
PFT done today. 

## 2015-10-25 NOTE — Progress Notes (Signed)
Subjective:    Patient ID: Peter Mahoney, male    DOB: 20-Jan-1961,    MRN: [id number]    Brief patient profile:  75 yowm active smoker with new doe x 2014 p knee surgery with ?copd on cxr at that point referred to pulmonary clinic 05/19/2015  By Delman Cheadle with ? PF    History of Present Illness  05/19/2015 1st [facility] office visit/ Wert   Chief Complaint  Patient presents with  . Pulmonay Consult    Referred by Delman Cheadle for eval of abnormal cxr. Pt states cxr was done to eval SOB. He states he has had some SOB on and off since 2014. He has some cough in the am with clear sputum.   am cough x 5 min > minimal mucus= clear/ not worse in winter or particular seasons  Walks up to 2 miles a day all flat s difficulty nl / fast pace No exp to chemo/ amio or h/o connective tissue dz  rec The key is to stop smoking completely before smoking completely stops you!   Please schedule a follow up office visit in 6 weeks, call sooner if needed with pfts on return. Add make sure taking ppi qd ac not prn   10/25/2015  f/u ov/Wert re:   PF on aciphex 20 mg qam ac / still actively smokng  Chief Complaint  Patient presents with  . Follow-up    PFT done.   no am cough/ congestion  Walked at [place] with hills with  mild /mod dyspnea  but up to 30 min at a time without having to stop  No obvious day to day or daytime variability or assoc excess/ purulent sputum or mucus plugs or hemoptysis or cp or chest tightness, subjective wheeze or overt sinus or hb symptoms. No unusual exp hx or h/o childhood pna/ asthma or knowledge of premature birth.  Sleeping ok without nocturnal  or early am exacerbation  of respiratory  c/o's or need for noct saba. Also denies any obvious fluctuation of symptoms with weather or environmental changes or other aggravating or alleviating factors except as outlined above   Current Medications, Allergies, Complete Past Medical History, Past Surgical History,  Family History, and Social History were reviewed in [employer] record.  ROS  The following are not active complaints unless bolded sore throat, dysphagia, dental problems, itching, sneezing,  nasal congestion or excess/ purulent secretions, ear ache,   fever, chills, sweats, unintended wt loss, classically pleuritic or exertional cp,  orthopnea pnd or leg swelling, presyncope, palpitations, abdominal pain, anorexia, nausea, vomiting, diarrhea  or change in bowel or bladder habits, change in stools or urine, dysuria,hematuria,  rash, arthralgias, visual complaints, headache, numbness, weakness or ataxia or problems with walking or coordination,  change in mood/affect or memory.              Objective:   Physical Exam  amb wm nad  10/25/2015          174   05/19/15 179 lb (81.194 kg)  05/12/15 175 lb (79.379 kg)  11/24/12 171 lb 14.4 oz (77.973 kg)    Vital signs reviewed    HEENT: nl dentition, turbinates, and oropharynx. Nl external ear canals without cough reflex   NECK :  without JVD/Nodes/TM/ nl carotid upstrokes bilaterally   LUNGS: no acc muscle use,  Nl contour chest which is clear to A and P bilaterally without cough on insp or exp maneuvers  CV:  RRR  no s3 or murmur or increase in P2, no edema   ABD:  soft and nontender with nl inspiratory excursion in the supine position. No bruits or organomegaly, bowel sounds nl  MS:  Nl gait/ ext warm without deformities, calf tenderness, cyanosis or clubbing No obvious joint restrictions   SKIN: warm and dry without lesions    NEURO:  alert, approp, nl sensorium with  no motor deficits           Assessment & Plan:

## 2015-10-25 NOTE — Assessment & Plan Note (Addendum)
05/19/2015  Walked RA x 3 laps @ 185 ft each stopped due to  End of study, rapid  pace, no sob or desat    - PFT's  10/25/2015  VC  4.67 s obst with DLCO  49/48 % corrects to 55  % for alv volume   So clearly doesn't have copd but could well prove to have one of the other complications of smoking eg ILD, either RBILD or DIP > UIP favored - needs hrct to sort out  I had an extended discussion with the patient reviewing all relevant studies completed to date and  lasting 15 to 20 minutes of a 25 minute visit    Each maintenance medication was reviewed in detail including most importantly the difference between maintenance and prns and under what circumstances the prns are to be triggered using an action plan format that is not reflected in the computer generated alphabetically organized AVS.    Please see instructions for details which were reviewed in writing and the patient given a copy highlighting the part that I personally wrote and discussed at today's ov.

## 2015-10-25 NOTE — Patient Instructions (Addendum)
Please see patient coordinator before you leave today  to schedule HRCT chest   Continue aciphex 20 mg Take 30-60 min before first meal of the day   The key is to stop smoking completely before smoking completely stops you - consider the e cigarettes as a potential one way bridge off all tobacco products   Please schedule a follow up visit in 6  months but call sooner if needed with pfts on return

## 2015-10-26 NOTE — Assessment & Plan Note (Signed)
>   3 min Discussed the risks and costs (both direct and indirect)  of smoking relative to the benefits of quitting but patient unwilling to commit at this point to a specific quit date.    Although I don't endorse regular use of e cigs/ many pts find them helpful; however, I emphasized they should be considered a "one-way bridge" off all tobacco products.  

## 2015-10-30 ENCOUNTER — Ambulatory Visit (INDEPENDENT_AMBULATORY_CARE_PROVIDER_SITE_OTHER)
Admission: RE | Admit: 2015-10-30 | Discharge: 2015-10-30 | Disposition: A | Discharge status: Home / Self Care | Payer: BLUE CROSS/BLUE SHIELD | Source: Ambulatory Visit | Attending: Internal Medicine | Admitting: Internal Medicine

## 2015-10-30 DIAGNOSIS — J8410 Pulmonary fibrosis, unspecified: Secondary | ICD-10-CM

## 2015-10-31 NOTE — Progress Notes (Signed)
Quick Note:  Spoke with pt and notified of results per Dr. Wert. Pt verbalized understanding and denied any questions.  ______ 

## 2015-12-12 ENCOUNTER — Ambulatory Visit (INDEPENDENT_AMBULATORY_CARE_PROVIDER_SITE_OTHER): Payer: BLUE CROSS/BLUE SHIELD | Admitting: Cardiology

## 2015-12-12 ENCOUNTER — Encounter: Payer: Self-pay | Admitting: Cardiology

## 2015-12-12 VITALS — BP 126/74 | HR 64 | Ht 72.0 in | Wt 184.0 lb

## 2015-12-12 DIAGNOSIS — R0609 Other forms of dyspnea: Secondary | ICD-10-CM | POA: Diagnosis not present

## 2015-12-12 DIAGNOSIS — Z720 Tobacco use: Secondary | ICD-10-CM | POA: Diagnosis not present

## 2015-12-12 DIAGNOSIS — R0600 Dyspnea, unspecified: Secondary | ICD-10-CM

## 2015-12-12 DIAGNOSIS — G4730 Sleep apnea, unspecified: Secondary | ICD-10-CM | POA: Insufficient documentation

## 2015-12-12 DIAGNOSIS — R0683 Snoring: Secondary | ICD-10-CM

## 2015-12-12 DIAGNOSIS — F17210 Nicotine dependence, cigarettes, uncomplicated: Secondary | ICD-10-CM

## 2015-12-12 DIAGNOSIS — R5383 Other fatigue: Secondary | ICD-10-CM

## 2015-12-12 DIAGNOSIS — I25118 Atherosclerotic heart disease of native coronary artery with other forms of angina pectoris: Secondary | ICD-10-CM | POA: Diagnosis not present

## 2015-12-12 DIAGNOSIS — I2510 Atherosclerotic heart disease of native coronary artery without angina pectoris: Secondary | ICD-10-CM | POA: Insufficient documentation

## 2015-12-12 NOTE — Assessment & Plan Note (Signed)
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

## 2015-12-12 NOTE — Assessment & Plan Note (Signed)
Quit 6 weeks ago

## 2015-12-12 NOTE — Addendum Note (Signed)
Addended by: Therisa Doyne on: 12/12/2015 03:52 PM   Modules accepted: Orders

## 2015-12-12 NOTE — Progress Notes (Signed)
12/12/2015 Peter Mahoney   Nov 14, 1960  [id number]  Primary Physician Glo Herring., MD Primary Cardiologist: Dr Ellyn Hack  HPI:  55 y/o male with a history of paitations and COPD. He has had a low risk Myoview in 2013. He recently saw Dr Melvyn Novas for COPD and a chest CTA was performed. this revealed a lung nodule and Ca++ noted in LAD.  The pt became concerned once he saw this. He did some research and wanted to be seen. He denies any chest pain that sounds like angina but he does say he has progressive exertional fatigue and dyspnea.    Current Outpatient Prescriptions  Medication Sig Dispense Refill  . RABEprazole (ACIPHEX) 20 MG tablet Take 20 mg by mouth daily.     No current facility-administered medications for this visit.     Allergies  Allergen Reactions  . Doxycycline Nausea And Vomiting  . Adhesive [Tape] Other (See Comments)    SKIN IRRITATION    Social History   Social History  . Marital status: Married    Spouse name: Peter Mahoney  . Number of children: 2  . Years of education: 12   Occupational History  .  [employer]   Social History Main Topics  . Smoking status: Former Smoker    Packs/day: 1.00    Years: 35.00    Types: Cigarettes    Quit date: 11/01/2015  . Smokeless tobacco: Never Used  . Alcohol use 1.2 oz/week    2 Standard drinks or equivalent per week     Comment: occasionally  . Drug use: No  . Sexual activity: Not on file   Other Topics Concern  . Not on file   Social History Narrative   Married father of 2, Grandfather of 3. Wife's name is Peter Mahoney.   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   Currently smokes a pack a day, and drinks maybe 2-3 alcohol beverages a week.     Review of Systems: General: negative for chills, fever, night sweats or weight changes.  Cardiovascular: negative for chest pain, dyspnea on exertion, edema, orthopnea, palpitations, paroxysmal  nocturnal dyspnea or shortness of breath Dermatological: negative for rash Respiratory: negative for cough or wheezing Urologic: negative for hematuria Abdominal: negative for nausea, vomiting, diarrhea, bright red blood per rectum, melena, or hematemesis Neurologic: negative for visual changes, syncope, or dizziness All other systems reviewed and are otherwise negative except as noted above.    Blood pressure 126/74, pulse 64, height 6' (1.829 m), weight 184 lb (83.5 kg).  General appearance: alert, cooperative and no distress Neck: no carotid bruit and no JVD Lungs: clear to auscultation bilaterally Heart: regular rate and rhythm Abdomen: soft, non-tender; bowel sounds normal; no masses,  no organomegaly Extremities: extremities normal, atraumatic, no cyanosis or edema Pulses: 2+ and symmetric Skin: Skin color, texture, turgor normal. No rashes or lesions Neurologic: Grossly normal  EKG NSR  ASSESSMENT AND PLAN:   DOE (dyspnea on exertion) Pt c/o of progressive DOE-R/O anginal equivalent  Cigarette smoker Quit 6 weeks ago  CAD (coronary atherosclerotic disease) Ca++ LAD sen on CTA   PLAN  I suggested we proceed with a Myoview. He does not feel he is able to walk on a treadmill secondary to SOB. He had lipids done in Nov in [place] and I'll see if I can get those results. I also ordered a sleep study. F/U  Dr Ellyn Hack after the above tests.   Kerin Ransom PA-C 12/12/2015 3:42 PM

## 2015-12-12 NOTE — Patient Instructions (Signed)
Procedures  Your physician has requested that you have a lexiscan myoview. For further information please visit [email or web address]. Please follow instruction sheet, as given.   Your physician has recommended that you have a sleep study. This test records several body functions during sleep, including: brain activity, eye movement, oxygen and carbon dioxide blood levels, heart rate and rhythm, breathing rate and rhythm, the flow of air through your mouth and nose, snoring, body muscle movements, and chest and belly movement.   Follow-Up with Dr. Ellyn Hack after tests.

## 2015-12-12 NOTE — Assessment & Plan Note (Signed)
Pt c/o of progressive DOE

## 2015-12-19 ENCOUNTER — Telehealth (HOSPITAL_COMMUNITY): Payer: Self-pay | Admitting: *Deleted

## 2015-12-19 NOTE — Telephone Encounter (Signed)
Close encounter 

## 2015-12-21 ENCOUNTER — Ambulatory Visit (HOSPITAL_COMMUNITY)
Admission: RE | Admit: 2015-12-21 | Discharge: 2015-12-21 | Disposition: A | Discharge status: Home / Self Care | Payer: BLUE CROSS/BLUE SHIELD | Source: Ambulatory Visit | Attending: Cardiology | Admitting: Cardiology

## 2015-12-21 DIAGNOSIS — Z87891 Personal history of nicotine dependence: Secondary | ICD-10-CM | POA: Diagnosis not present

## 2015-12-21 DIAGNOSIS — J449 Chronic obstructive pulmonary disease, unspecified: Secondary | ICD-10-CM | POA: Diagnosis not present

## 2015-12-21 DIAGNOSIS — G4733 Obstructive sleep apnea (adult) (pediatric): Secondary | ICD-10-CM | POA: Diagnosis not present

## 2015-12-21 DIAGNOSIS — I25118 Atherosclerotic heart disease of native coronary artery with other forms of angina pectoris: Secondary | ICD-10-CM | POA: Insufficient documentation

## 2015-12-21 DIAGNOSIS — R5383 Other fatigue: Secondary | ICD-10-CM | POA: Insufficient documentation

## 2015-12-21 DIAGNOSIS — Z8249 Family history of ischemic heart disease and other diseases of the circulatory system: Secondary | ICD-10-CM | POA: Insufficient documentation

## 2015-12-21 DIAGNOSIS — R0609 Other forms of dyspnea: Secondary | ICD-10-CM | POA: Diagnosis not present

## 2015-12-21 MED ORDER — TECHNETIUM TC 99M TETROFOSMIN IV KIT
30.5000 | PACK | Freq: Once | INTRAVENOUS | Status: AC | PRN
  Administered 2015-12-21: 30.5 via INTRAVENOUS
  Filled 2015-12-21: qty 31

## 2015-12-21 MED ORDER — TECHNETIUM TC 99M TETROFOSMIN IV KIT
10.7000 | PACK | Freq: Once | INTRAVENOUS | Status: AC | PRN
  Administered 2015-12-21: 10.7 via INTRAVENOUS
  Filled 2015-12-21: qty 11

## 2015-12-21 MED ORDER — REGADENOSON 0.4 MG/5ML IV SOLN
0.4000 mg | Freq: Once | INTRAVENOUS | Status: AC
  Administered 2015-12-21: 0.4 mg via INTRAVENOUS

## 2015-12-22 LAB — MYOCARDIAL PERFUSION IMAGING
LV dias vol: 129 mL (ref 62–150)
LV sys vol: 69 mL
Peak HR: 82 {beats}/min
Rest HR: 56 {beats}/min
SDS: 3
SRS: 1
SSS: 4
TID: 1.32

## 2015-12-31 ENCOUNTER — Ambulatory Visit (HOSPITAL_BASED_OUTPATIENT_CLINIC_OR_DEPARTMENT_OTHER): Payer: BLUE CROSS/BLUE SHIELD | Attending: Cardiology | Admitting: Cardiovascular Disease

## 2015-12-31 VITALS — Ht 72.0 in | Wt 184.0 lb

## 2015-12-31 DIAGNOSIS — R5383 Other fatigue: Secondary | ICD-10-CM | POA: Insufficient documentation

## 2015-12-31 DIAGNOSIS — G4719 Other hypersomnia: Secondary | ICD-10-CM | POA: Diagnosis not present

## 2015-12-31 DIAGNOSIS — G4733 Obstructive sleep apnea (adult) (pediatric): Secondary | ICD-10-CM | POA: Insufficient documentation

## 2015-12-31 DIAGNOSIS — G4730 Sleep apnea, unspecified: Secondary | ICD-10-CM

## 2015-12-31 DIAGNOSIS — I493 Ventricular premature depolarization: Secondary | ICD-10-CM | POA: Diagnosis not present

## 2015-12-31 DIAGNOSIS — R0683 Snoring: Secondary | ICD-10-CM | POA: Insufficient documentation

## 2016-01-14 NOTE — Procedures (Signed)
     Patient Name: Peter Mahoney, Peter Mahoney Date: 12/31/2015 Gender: Male D.O.B: October 13, 1960 Age (years): 75 Referring Provider: Kerin Ransom Height (inches): 72 Interpreting Physician: Shelva Majestic MD, ABSM Weight (lbs): 184 RPSGT: Jonna Coup BMI: 25 MRN: [id number] Neck Size: 15.00  CLINICAL INFORMATION Sleep Study Type: NPSG Indication for sleep study: Daytime Fatigue, Fatigue, Snoring Epworth Sleepiness Score: 10  SLEEP STUDY TECHNIQUE As per the AASM Manual for the Scoring of Sleep and Associated Events v2.3 (April 2016) with a hypopnea requiring 4% desaturations. The channels recorded and monitored were frontal, central and occipital EEG, electrooculogram (EOG), submentalis EMG (chin), nasal and oral airflow, thoracic and abdominal wall motion, anterior tibialis EMG, snore microphone, electrocardiogram, and pulse oximetry.  MEDICATIONS RABEprazole (ACIPHEX) 20 MG tablet  Medications self-administered by patient during sleep study : No sleep medicine administered.  SLEEP ARCHITECTURE The study was initiated at 9:33:26 PM and ended at 4:33:48 AM. Sleep onset time was 67.5 minutes and the sleep efficiency was 67.9%. The total sleep time was 285.5 minutes. Stage REM latency was 74.5 minutes. The patient spent 2.45% of the night in stage N1 sleep, 79.16% in stage N2 sleep, 2.28% in stage N3 and 16.11% in REM. Alpha intrusion was absent. Supine sleep was 27.50%.  RESPIRATORY PARAMETERS The overall apnea/hypopnea index (AHI) was 0.2 per hour. There were 0 total apneas, including 0 obstructive, 0 central and 0 mixed apneas. There were 1 hypopneas and 2 RERAs. The AHI during Stage REM sleep was 1.3 per hour. AHI while supine was 0.8 per hour. The mean oxygen saturation was 91.66%. The minimum SpO2 during sleep was 89.00%. Moderate snoring was noted during this study.  CARDIAC DATA The 2 lead EKG demonstrated sinus rhythm. The mean heart rate was 62.93 beats per minute.  Other EKG findings include: rare PVC.  LEG MOVEMENT DATA The total PLMS were 1 with a resulting PLMS index of 0.21. Associated arousal with leg movement index was 0.2.  IMPRESSIONS - No significant obstructive sleep apnea occurred during this study (AHI = 0.2/h). - No significant central sleep apnea occurred during this study (CAI = 0.0/h). - Mild oxygen desaturation to a nadir of 89.00%. - Reduced sleep efficiency at 67.9% - The patient snored with Moderate snoring volume. - EKG findings include rare PVC. - Clinically significant periodic limb movements did not occur during sleep. No significant associated arousals.  DIAGNOSIS - Snoring - Excessive Daytime Sleepiness  RECOMMENDATIONS - There is no indication for CPAP therapy. - Efforts should be made to optimize nasal and oropharyngeal patency. - Consider alternatives for the treatment of snoring.  - Avoid alcohol, sedatives and other CNS depressants that may worsen sleep apnea and disrupt normal sleep architecture. - Sleep hygiene should be reviewed to assess factors that may improve sleep quality and duration.  [Electronically signed] 01/14/2016 08:34 PM  Shelva Majestic MD, [facility], ABSM Diplomate, American Board of Sleep Medicine   NPI: [id number]  [place] PH: [phone]   FX: [phone] [place]

## 2016-01-16 ENCOUNTER — Telehealth: Payer: Self-pay | Admitting: *Deleted

## 2016-01-16 NOTE — Progress Notes (Signed)
Left message to return a call to discuss sleep study results. 

## 2016-01-16 NOTE — Telephone Encounter (Signed)
Left message to return a call to discuss sleep study results. 

## 2016-01-17 ENCOUNTER — Telehealth: Payer: Self-pay | Admitting: Cardiovascular Disease

## 2016-01-17 NOTE — Telephone Encounter (Signed)
Peter Mahoney is calling to get Sleep results

## 2016-01-17 NOTE — Telephone Encounter (Signed)
RECOMMENDATIONS - There is no indication for CPAP therapy. - Efforts should be made to optimize nasal and oropharyngeal patency. - Consider alternatives for the treatment of snoring.  - Avoid alcohol, sedatives and other CNS depressants that may worsen sleep apnea and disrupt normal sleep architecture. - Sleep hygiene should be reviewed to assess factors that may improve sleep quality and duration.   [Electronically signed] 01/14/2016 08:34 PM   I called patient and gave recommendations. He voiced thanks and understanding. No further concerns or questions at this time.

## 2016-01-21 HISTORY — PX: TRANSTHORACIC ECHOCARDIOGRAM: SHX275

## 2016-01-23 NOTE — Progress Notes (Signed)
pt aware of results  

## 2016-01-26 ENCOUNTER — Encounter: Payer: Self-pay | Admitting: Cardiology

## 2016-01-26 ENCOUNTER — Ambulatory Visit (INDEPENDENT_AMBULATORY_CARE_PROVIDER_SITE_OTHER): Payer: BLUE CROSS/BLUE SHIELD | Admitting: Cardiology

## 2016-01-26 VITALS — BP 122/77 | HR 90 | Ht 72.0 in | Wt 186.6 lb

## 2016-01-26 DIAGNOSIS — I2510 Atherosclerotic heart disease of native coronary artery without angina pectoris: Secondary | ICD-10-CM | POA: Diagnosis not present

## 2016-01-26 DIAGNOSIS — N529 Male erectile dysfunction, unspecified: Secondary | ICD-10-CM | POA: Diagnosis not present

## 2016-01-26 DIAGNOSIS — F17210 Nicotine dependence, cigarettes, uncomplicated: Secondary | ICD-10-CM

## 2016-01-26 DIAGNOSIS — R0609 Other forms of dyspnea: Secondary | ICD-10-CM

## 2016-01-26 DIAGNOSIS — R9439 Abnormal result of other cardiovascular function study: Secondary | ICD-10-CM

## 2016-01-26 MED ORDER — SILDENAFIL CITRATE 100 MG PO TABS: 100.0000 mg | ORAL_TABLET | Freq: Every day | ORAL | 6 refills | Status: DC | PRN

## 2016-01-26 NOTE — Patient Instructions (Addendum)
MEDICATION VIAGRA 100 MG  MAY USE AS NEEDED ( CAN TAKE 1/2 TABLET IF NEEDED)   SCHEDULE AT [facility] [address] Your physician has requested that you have an echocardiogram. Echocardiography is a painless test that uses sound waves to create images of your heart. It provides your doctor with information about the size and shape of your heart and how well your heart's chambers and valves are working. This procedure takes approximately one hour. There are no restrictions for this procedure.   Your physician wants you to follow-up in: [place].  You will receive a reminder letter in the mail two months in advance. If you don't receive a letter, please call our office to schedule the follow-up appointment.  If you need a refill on your cardiac medications before your next appointment, please call your pharmacy.

## 2016-01-26 NOTE — Progress Notes (Signed)
PCP: Glo Herring., MD  Clinic Note: Chief Complaint  Patient presents with  . Shortness of Breath    pt states he is SOB more since he has stopped smoking since October 30, 2015, but no chest pain     HPI: Peter Mahoney is a 55 y.o. male with a PMH below who presents today for f/u for SOB/DOE & coronary Calcification on CTA chest. He is a Now former smoker and Occupational hygienist at [employer], with no prior cardiac or cardiovascular history, but with a mild family history of a mother with atrial fibrillation.    Peter Mahoney was last seen by me back in 11/2012 for DOE -- He was evaluated with a Nuclear ST - walked ~7 min & stopped 2/2 DOE.  NO ECG or imaging evidence of ischemia or infarct.   Saw Kerin Ransom on 12/12/15 -- ordered a [place] as a physiologic test to assess LAD calcification noted on a CTA chest ordered by Pulm MD --> Still has DOE.  Recent Hospitalizations: n/a  Studies Reviewed: Myoview & Sleep Study  Myoview:  The left ventricular ejection fraction is mildly decreased (45-54%).  Nuclear stress EF: 46%.  There was no ST segment deviation noted during stress.  The study is normal.  This is a low risk study.   Normal pharmacologic nuclear stress test with evidence for prior infarct or ischemia.  Mildly decreased LVEF but appears visually better than calculated, a correlation with an echocardiogram is recommended.   Sleep Study - results not able to be read  CTA chest - personally reviewed.  Ca noted on LAD.  Interval History: Peter Mahoney presents today still indicating that he has exertional dyspnea. He denies any resting or exertional chest tightness or pressure.. He he actually states that his dyspnea has gotten worse since he stopped smoking He denies any resting symptoms of PND orthopnea or edema.  He does note that he still has erectile dysfunction issues that he indicated back in 2014 as well. Difficult to attain and maintain an erection.  No  palpitations, lightheadedness, dizziness, weakness or syncope/near syncope. No TIA/amaurosis fugax symptoms. No melena, hematochezia, hematuria, or epstaxis. No claudication.  ROS: A comprehensive was performed. Review of Systems  Constitutional: Positive for malaise/fatigue (Tired because he is not able to do much). Negative for weight loss.  HENT: Negative for nosebleeds.   Respiratory: Positive for shortness of breath and wheezing. Negative for cough.   Cardiovascular: Negative for leg swelling.       Per history of present illness  Gastrointestinal: Negative for blood in stool, constipation, heartburn and melena.  Musculoskeletal: Negative for falls and joint pain.  Neurological: Negative for dizziness and headaches.  Psychiatric/Behavioral: Negative for depression. The patient is nervous/anxious (Just wants to know why he is short of breath).   All other systems reviewed and are negative.   Past Medical History:  Diagnosis Date  . Dental crown present   . GERD (gastroesophageal reflux disease)   . Post-operative infection 09/2011   left leg  . Seasonal allergies   . Wound infection after surgery, left lateral leg 10/08/2011    Past Surgical History:  Procedure Laterality Date  . BACK SURGERY  04/1995   L5-S1  . COLONOSCOPY N/A 05/12/2015   Procedure: COLONOSCOPY;  Surgeon: Aviva Signs, MD;  [facility];  Service: Gastroenterology;  Laterality: N/A;  . INCISION AND DRAINAGE OF WOUND  10/08/2011   Procedure: IRRIGATION AND DEBRIDEMENT WOUND;  Surgeon: Lenetta Quaker  Mardelle Matte, MD;  Location: [place];  Service: Orthopedics;  Laterality: Left;  left left post op wound  . KNEE ARTHROSCOPY  11/2010   left knee - meniscus tear  . KNEE SURGERY  08/26/2011   hamstring tendon tear left  . NM MYOVIEW LTD  11/17/2012   Exercise 7 minutes. EF 60%. No ischemia or infarction.; Low risk  . TONSILLECTOMY     as a child    Prior to Admission medications   Medication  Sig Start Date End Date Taking? Authorizing Provider  RABEprazole (ACIPHEX) 20 MG tablet Take 20 mg by mouth daily.   Yes Historical Provider, MD   Allergies  Allergen Reactions  . Doxycycline Nausea And Vomiting  . Adhesive [Tape] Other (See Comments)    SKIN IRRITATION    Social History   Social History  . Marital status: Married    Spouse name: [name]  . Number of children: 2  . Years of education: 12   Occupational History  .  [employer]    [employer]   Social History Main Topics  . Smoking status: Former Smoker    Packs/day: 1.00    Years: 35.00    Types: Cigarettes    Quit date: 11/01/2015  . Smokeless tobacco: Never Used  . Alcohol use 1.2 oz/week    2 Standard drinks or equivalent per week     Comment: occasionally  . Drug use: No  . Sexual activity: Not Asked   Other Topics Concern  . None   Social History Narrative   Married father of 2, Grandfather of 3. Wife's name is [name].   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   Currently smokes a pack a day, and drinks maybe 2-3 alcohol beverages a week.         Epworth Sleepiness Scale      Score: 9      --I seem to be losing my sex drive   --I feel stressed and lack motivation   --I have COPD   --I have been told that I snore   --I am overweight or am gaining weight   --I awake feeling not rested    Family History  Problem Relation Age of Onset  . Heart Problems Mother   . Stroke Mother   . Pulmonary fibrosis Mother   . Cancer - Lung Father     smoked  . Heart failure Maternal Grandmother   . Emphysema Maternal Grandfather     "his whole family had Emphysema"  . Diabetes Paternal Grandmother   . Cancer Paternal Grandfather     Wt Readings from Last 3 Encounters:  01/26/16 84.6 kg (186 lb 9.6 oz)  12/31/15 83.5 kg (184 lb)  12/12/15 83.5 kg (184 lb)    PHYSICAL EXAM BP 122/77   Pulse 90   Ht 6' (1.829 m)   Wt 84.6 kg (186 lb  9.6 oz)   SpO2 97%   BMI 25.31 kg/m  General appearance: alert, cooperative, appears stated age, no distress and Otherwise well-nourished well-groomed. Neck: no adenopathy, no carotid bruit and no JVD Lungs: clear to auscultation bilaterally, normal percussion bilaterally and non-labored Heart: regular rate and rhythm, S1 and S2 normal, no murmur, click, rub or gallop; nondisplaced PMI Abdomen: soft, non-tender; bowel sounds normal; no masses,  no organomegaly;  Extremities: extremities normal, atraumatic, no cyanosis, and edema - trivial Neurologic: Mental status: Alert,  oriented, thought content appropriate    Adult ECG Report - not checked.  Other studies Reviewed: Additional studies/ records that were reviewed today include:  Recent Labs:  n/a     ASSESSMENT / PLAN: Problem List Items Addressed This Visit    Erectile dysfunction    Rx Viagra (worked in the past)      Relevant Orders   ECHOCARDIOGRAM COMPLETE   DOE (dyspnea on exertion) - Primary (Chronic)    Progressive symptoms being followed by pulmonary and now with a recent negative Myoview. The monitor shows possibly reduced EF. There is some discrepancy between what the computer reads and the physician reading the study saw, I think is not a unreasonable consideration or an echocardiogram based on the reader's recommendations. This would allow [place] to confirm or deny presence of any cardiomyopathy but will also allow [place] to measure pulmonary pressures etc. Plan: 2-D echo      Relevant Orders   ECHOCARDIOGRAM COMPLETE   Coronary artery calcification seen on CAT scan (Chronic)    LAD calcification noted on CT scan. Myoview ordered showing no antigravity evidence of ischemia or infarction. I think is probably not flow-limiting. Caryl Comes however would recommend continued treatment of cardiac risk factors. This includes treatment of blood pressure, cholesterol and ensure smoking cessation. Also diet and exercise noted       Relevant Medications   sildenafil (VIAGRA) 100 MG tablet   Cigarette smoker    Quit ~2 months ago - congratulated his efforts.      Abnormal stress test    If reduction noted on Myoview. The computer read it is reduced EF, but the reader felt this was inaccurate. 2-D echocardiogram recommended.  Plan: Check 2-D echo      Relevant Orders   ECHOCARDIOGRAM COMPLETE    Other Visit Diagnoses   None.     Current medicines are reviewed at length with the patient today. (+/- concerns) Still has ED - ? Viagra  The following changes have been made:  VIAGRA 100 MG  MAY USE AS NEEDED ( CAN TAKE 1/2 TABLET IF NEEDED)  Studies Ordered:   Orders Placed This Encounter  Procedures  . ECHOCARDIOGRAM COMPLETE   Six-month follow-up   Glenetta Hew, M.D., M.S. Interventional Cardiologist   Pager # [phone] Phone # [phone] [address] [place], [place] [address]

## 2016-01-28 DIAGNOSIS — R9439 Abnormal result of other cardiovascular function study: Secondary | ICD-10-CM | POA: Insufficient documentation

## 2016-01-28 NOTE — Assessment & Plan Note (Signed)
Progressive symptoms being followed by pulmonary and now with a recent negative Myoview. The monitor shows possibly reduced EF. There is some discrepancy between what the computer reads and the physician reading the study saw, I think is not a unreasonable consideration or an echocardiogram based on the reader's recommendations. This would allow [place] to confirm or deny presence of any cardiomyopathy but will also allow [place] to measure pulmonary pressures etc. Plan: 2-D echo

## 2016-01-28 NOTE — Assessment & Plan Note (Signed)
If reduction noted on Myoview. The computer read it is reduced EF, but the reader felt this was inaccurate. 2-D echocardiogram recommended.  Plan: Check 2-D echo

## 2016-01-28 NOTE — Assessment & Plan Note (Signed)
LAD calcification noted on CT scan. Myoview ordered showing no antigravity evidence of ischemia or infarction. I think is probably not flow-limiting. Peter Mahoney however would recommend continued treatment of cardiac risk factors. This includes treatment of blood pressure, cholesterol and ensure smoking cessation. Also diet and exercise noted

## 2016-01-28 NOTE — Assessment & Plan Note (Signed)
Rx Viagra (worked in the past)

## 2016-01-28 NOTE — Assessment & Plan Note (Signed)
Quit ~2 months ago - congratulated his efforts.

## 2016-01-31 ENCOUNTER — Telehealth: Payer: Self-pay | Admitting: *Deleted

## 2016-01-31 NOTE — Telephone Encounter (Signed)
IN PROCESS FOR PRIOR AUTHORIZATION

## 2016-02-15 NOTE — Telephone Encounter (Signed)
Spoke to representative Case# [id number] Prior authorization completed over the phone   Dx :erectile dysfunction Patient not to use medication with nitrates  Quantity 10 per month  Approved until 10/26 /2018

## 2016-02-16 ENCOUNTER — Other Ambulatory Visit: Payer: Self-pay

## 2016-02-16 ENCOUNTER — Ambulatory Visit (HOSPITAL_COMMUNITY): Payer: BLUE CROSS/BLUE SHIELD | Attending: Cardiovascular Disease

## 2016-02-16 DIAGNOSIS — Z87891 Personal history of nicotine dependence: Secondary | ICD-10-CM | POA: Insufficient documentation

## 2016-02-16 DIAGNOSIS — R9439 Abnormal result of other cardiovascular function study: Secondary | ICD-10-CM | POA: Insufficient documentation

## 2016-02-16 DIAGNOSIS — R0609 Other forms of dyspnea: Secondary | ICD-10-CM | POA: Diagnosis not present

## 2016-02-16 DIAGNOSIS — N529 Male erectile dysfunction, unspecified: Secondary | ICD-10-CM | POA: Diagnosis not present

## 2016-04-08 ENCOUNTER — Other Ambulatory Visit: Payer: Self-pay | Admitting: Internal Medicine

## 2016-04-08 DIAGNOSIS — R918 Other nonspecific abnormal finding of lung field: Secondary | ICD-10-CM | POA: Insufficient documentation

## 2016-04-08 DIAGNOSIS — R911 Solitary pulmonary nodule: Secondary | ICD-10-CM | POA: Insufficient documentation

## 2016-04-17 ENCOUNTER — Ambulatory Visit (INDEPENDENT_AMBULATORY_CARE_PROVIDER_SITE_OTHER)
Admission: RE | Admit: 2016-04-17 | Discharge: 2016-04-17 | Disposition: A | Discharge status: Home / Self Care | Payer: BLUE CROSS/BLUE SHIELD | Source: Ambulatory Visit | Attending: Internal Medicine | Admitting: Internal Medicine

## 2016-04-17 DIAGNOSIS — R911 Solitary pulmonary nodule: Secondary | ICD-10-CM

## 2016-04-17 NOTE — Progress Notes (Signed)
Spoke with pt and notified of results per Dr. Wert. Pt verbalized understanding and denied any questions. 

## 2016-04-26 ENCOUNTER — Encounter: Payer: Self-pay | Admitting: Internal Medicine

## 2016-04-26 ENCOUNTER — Ambulatory Visit (INDEPENDENT_AMBULATORY_CARE_PROVIDER_SITE_OTHER): Payer: BLUE CROSS/BLUE SHIELD | Admitting: Internal Medicine

## 2016-04-26 VITALS — BP 114/70 | HR 74 | Ht 72.0 in | Wt 189.0 lb

## 2016-04-26 DIAGNOSIS — J8410 Pulmonary fibrosis, unspecified: Secondary | ICD-10-CM | POA: Diagnosis not present

## 2016-04-26 DIAGNOSIS — R911 Solitary pulmonary nodule: Secondary | ICD-10-CM

## 2016-04-26 LAB — PULMONARY FUNCTION TEST
DL/VA % PRED: 61 %
DL/VA: 2.88 ml/min/mmHg/L
DLCO COR: 17.95 ml/min/mmHg
DLCO UNC % PRED: 55 %
DLCO UNC: 18.62 ml/min/mmHg
DLCO cor % pred: 53 %
FEF 25-75 POST: 3.18 L/s
FEF 25-75 PRE: 2.88 L/s
FEF2575-%CHANGE-POST: 10 %
FEF2575-%PRED-POST: 94 %
FEF2575-%PRED-PRE: 85 %
FEV1-%CHANGE-POST: 2 %
FEV1-%Pred-Post: 91 %
FEV1-%Pred-Pre: 89 %
FEV1-Post: 3.62 L
FEV1-Pre: 3.53 L
FEV1FVC-%CHANGE-POST: 0 %
FEV1FVC-%PRED-PRE: 99 %
FEV6-%CHANGE-POST: 0 %
FEV6-%Pred-Post: 94 %
FEV6-%Pred-Pre: 93 %
FEV6-PRE: 4.61 L
FEV6-Post: 4.65 L
FEV6FVC-%CHANGE-POST: 0 %
FEV6FVC-%Pred-Post: 102 %
FEV6FVC-%Pred-Pre: 103 %
FVC-%Change-Post: 1 %
FVC-%Pred-Post: 91 %
FVC-%Pred-Pre: 90 %
FVC-Post: 4.71 L
POST FEV1/FVC RATIO: 77 %
PRE FEV1/FVC RATIO: 76 %
Post FEV6/FVC ratio: 99 %
Pre FEV6/FVC Ratio: 99 %
RV % pred: 87 %
RV: 1.9 L
TLC % pred: 93 %
TLC: 6.74 L

## 2016-04-26 NOTE — Patient Instructions (Addendum)
Congratulations on not smoking - it's the most important aspect of your care   We will be calling to schedule your follow up

## 2016-04-26 NOTE — Progress Notes (Signed)
Subjective:    Patient ID: Peter Mahoney, male    DOB: 1960/08/29,    MRN: [id number]    Brief patient profile:  55 yowm active smoker with new doe x 2014 p knee surgery with ?copd on cxr at that point referred to pulmonary clinic 05/19/2015  By Delman Cheadle with ? PF    History of Present Illness  05/19/2015 1st [facility] office visit/ Nisa Decaire   Chief Complaint  Patient presents with  . Pulmonay Consult    Referred by Delman Cheadle for eval of abnormal cxr. Pt states cxr was done to eval SOB. He states he has had some SOB on and off since 2014. He has some cough in the am with clear sputum.   am cough x 5 min > minimal mucus= clear/ not worse in winter or particular seasons  Walks up to 2 miles a day all flat s difficulty nl / fast pace No exp to chemo/ amio or h/o connective tissue dz  rec The key is to stop smoking completely before smoking completely stops you!   Please schedule a follow up office visit in 6 weeks, call sooner if needed with pfts on return. 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 Follow-up    PFT done.   no am cough/ congestion  Walked at [place] with hills with  mild /mod dyspnea  but up to 30 min at a time without having to stop rec Please see patient coordinator before you leave today  to schedule HRCT chest  Continue aciphex 20 mg Take 30-60 min before first meal of the day  The key is to stop smoking completely before smoking completely stops you - consider the e cigarettes as a potential one way bridge off all tobacco products      04/26/2016  f/u ov/Samari Gorby re: PF on aciphex last cig 10/30/15  Chief Complaint  Patient presents with  . Follow-up    Breathing is unchanged. He states he quit smoking 10/30/15. He has had recent sinus infection and c/o some PND. Coughing some but non prod.   Walk all day ok but one flight  At  home is his limit/ nl pace    No obvious day to day or daytime variability or assoc excess/ purulent sputum or mucus plugs or hemoptysis or cp or chest tightness, subjective wheeze or overt sinus or hb symptoms. No unusual exp hx or h/o childhood pna/ asthma or knowledge of premature birth.  Sleeping ok without nocturnal  or early am exacerbation  of respiratory  c/o's or need for noct saba. Also denies any obvious fluctuation of symptoms with weather or environmental changes or other aggravating or alleviating factors except as outlined above   Current Medications, Allergies, Complete Past Medical History, Past Surgical History, Family History, and Social History were reviewed in [employer] record.  ROS  The following are not active complaints unless bolded sore throat, dysphagia, dental problems, itching, sneezing,  nasal congestion or excess/ purulent secretions, ear ache,   fever, chills, sweats, unintended wt loss, classically pleuritic or exertional cp,  orthopnea pnd or leg swelling, presyncope, palpitations, abdominal pain, anorexia, nausea, vomiting, diarrhea  or change in bowel or bladder habits, change in stools or urine, dysuria,hematuria,  rash, arthralgias, visual complaints, headache, numbness, weakness or ataxia  or problems with walking or coordination,  change in mood/affect or memory.              Objective:   Physical Exam  amb wm nad  04/26/2016           189  10/25/2015          174   05/19/15 179 lb (81.194 kg)  05/12/15 175 lb (79.379 kg)  11/24/12 171 lb 14.4 oz (77.973 kg)    Vital signs reviewed  - Note on arrival 02 sats  96% on RA     HEENT: nl dentition, turbinates, and oropharynx. Nl external ear canals without cough reflex   NECK :  without JVD/Nodes/TM/ nl carotid upstrokes bilaterally   LUNGS: no acc muscle use,  Nl contour chest which is clear to A and P bilaterally without cough on insp or exp maneuvers   CV:  RRR  no s3 or  murmur or increase in P2, no edema   ABD:  soft and nontender with nl inspiratory excursion in the supine position. No bruits or organomegaly, bowel sounds nl  MS:  Nl gait/ ext warm without deformities, calf tenderness, cyanosis or clubbing No obvious joint restrictions   SKIN: warm and dry without lesions    NEURO:  alert, approp, nl sensorium with  no motor deficits      I personally reviewed images and agree with radiology impression as follows:  CT s contrast Chest  04/17/16   1. Stable subpleural 7 mm solid pulmonary nodule in the right upper lobe, for which 5 month stability has been demonstrated. Follow-up noncontrast chest CT in 12-18 months is considered optional for low-risk patients, but is recommended for high-risk patients. This recommendation follows the consensus statement: Guidelines for Management of Incidental Pulmonary Nodules Detected on CT Images: From the Fleischner Society 2017; Radiology 2017; 284:228-243. 2. Mild-to-moderate emphysema.      Assessment & Plan:

## 2016-04-28 NOTE — Assessment & Plan Note (Signed)
05/19/2015  Walked RA x 3 laps @ 185 ft each stopped due to  End of study, rapid  pace, no sob or desat    - PFT's  10/25/2015  VC  4.67 s obst with DLCO  49/48 % corrects to 55  % for alv volume   - HRCT 10/30/15 >> No evidence of interstitial lung disease. 2. Subpleural right upper lobe 7 mm pulmonary nodule. Non-contrast chest CT at 6-12 months is recommended. If the nodule is stable at time of repeat CT, then future CT at 18-24 months (from today's scan) is considered optional for low-risk patients - CT chest 04/17/2016 Stable subpleural 7 mm solid pulmonary nodule in the right upper lobe, for which 5 month stability has been demonstrated. Follow-up noncontrast chest CT in 12-18 months is considered optional for low-risk patients> since he is smoker f/u 12 months placed in reminder file for 04/17/17  - Spirometry 04/26/2016  FVC  3.53 (89%) no obst  DLCO 55/53 > corrects 61    No evidence of PF/ f/u spn (see separate a/p)

## 2016-04-28 NOTE — Assessment & Plan Note (Addendum)
CT chest 10/30/15 x 7 mm CT chest 04/17/16 x 7 mm > f/u 04/17/17 in reminder file   I had an extended discussion with the patient reviewing all relevant studies completed to date and  lasting 15 to 20 minutes of a 25 minute visit on the following ongoing concerns:   CT results reviewed with pt >>> Too small for PET or bx, not suspicious enough for excisional bx > really only option for now is follow the Fleischner society guidelines as rec by radiology.  Discussed in detail all the  indications, usual  risks and alternatives  relative to the benefits with patient who agrees to proceed with conservative f/u with CT in one year.

## 2016-05-06 ENCOUNTER — Ambulatory Visit (INDEPENDENT_AMBULATORY_CARE_PROVIDER_SITE_OTHER): Payer: BLUE CROSS/BLUE SHIELD | Admitting: Otolaryngology

## 2016-05-06 DIAGNOSIS — J343 Hypertrophy of nasal turbinates: Secondary | ICD-10-CM | POA: Diagnosis not present

## 2016-05-06 DIAGNOSIS — J310 Chronic rhinitis: Secondary | ICD-10-CM | POA: Diagnosis not present

## 2016-05-10 ENCOUNTER — Other Ambulatory Visit (INDEPENDENT_AMBULATORY_CARE_PROVIDER_SITE_OTHER): Payer: Self-pay | Admitting: Otolaryngology

## 2016-05-10 DIAGNOSIS — J0101 Acute recurrent maxillary sinusitis: Secondary | ICD-10-CM

## 2016-05-15 ENCOUNTER — Ambulatory Visit (HOSPITAL_COMMUNITY)
Admission: RE | Admit: 2016-05-15 | Discharge: 2016-05-15 | Disposition: A | Discharge status: Home / Self Care | Payer: BLUE CROSS/BLUE SHIELD | Source: Ambulatory Visit | Attending: Otolaryngology | Admitting: Otolaryngology

## 2016-05-15 DIAGNOSIS — J3489 Other specified disorders of nose and nasal sinuses: Secondary | ICD-10-CM | POA: Diagnosis not present

## 2016-05-15 DIAGNOSIS — R0981 Nasal congestion: Secondary | ICD-10-CM | POA: Insufficient documentation

## 2016-05-15 DIAGNOSIS — J0101 Acute recurrent maxillary sinusitis: Secondary | ICD-10-CM | POA: Diagnosis not present

## 2016-06-03 ENCOUNTER — Ambulatory Visit (INDEPENDENT_AMBULATORY_CARE_PROVIDER_SITE_OTHER): Payer: BLUE CROSS/BLUE SHIELD | Admitting: Otolaryngology

## 2016-06-03 DIAGNOSIS — J342 Deviated nasal septum: Secondary | ICD-10-CM

## 2016-06-03 DIAGNOSIS — J310 Chronic rhinitis: Secondary | ICD-10-CM

## 2016-06-03 DIAGNOSIS — J343 Hypertrophy of nasal turbinates: Secondary | ICD-10-CM

## 2016-07-30 ENCOUNTER — Ambulatory Visit (INDEPENDENT_AMBULATORY_CARE_PROVIDER_SITE_OTHER): Payer: BLUE CROSS/BLUE SHIELD | Admitting: Cardiology

## 2016-07-30 ENCOUNTER — Encounter: Payer: Self-pay | Admitting: Cardiology

## 2016-07-30 VITALS — BP 122/84 | HR 76 | Ht 72.0 in | Wt 189.6 lb

## 2016-07-30 DIAGNOSIS — I2510 Atherosclerotic heart disease of native coronary artery without angina pectoris: Secondary | ICD-10-CM

## 2016-07-30 DIAGNOSIS — R0609 Other forms of dyspnea: Secondary | ICD-10-CM

## 2016-07-30 NOTE — Patient Instructions (Signed)
NO CHANGES WITH TREATMENT    Your physician wants you to follow-up ON AS NEEDED BASIS

## 2016-07-30 NOTE — Progress Notes (Signed)
PCP: Glo Herring, MD  Clinic Note: Chief Complaint  Patient presents with  . Shortness of Breath    Exertional  . Follow-up    HPI: Peter Mahoney is a 56 y.o. male with a PMH below who presents today for six-month follow-up for shortness of breath and dyspnea with coronary artery calcification. 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 He has no prior cardiac history. I saw him in 2014 for dyspnea on exertion and he had nuclear stress test with no evidence of ischemia or infarction. He then had another Myoview in 2017 that was negative as well done in response to his chest CT scan.Peter Mahoney was last seen on 01/26/2016, he pretty much noted continued dyspnea on exertion. We reviewed his Myoview, sleep study and CTA of the chest.  Recent Hospitalizations: None  Studies personally Reviewed:   2-D echocardiogram October 2017: Normal LV function EF 5560%. No regional wall motion abnormality. 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 Mild LA dilation. Otherwise normal  Interval History: Peter Mahoney presents today pretty much stable. He still short of breath and he has been smoke free for at least 9 months.. Despite this he is still short of breath. He was told by his pulmonologist that is PFTs were somewhat improved, but only showing mild to moderate emphysema. He tried an inhaler that did not help. He says he is short of breath all the time even moving around in bed. But it is not associated I'll with any exertional chest tightness or pressure. He has no PND or orthopnea. No edema.   No palpitations, lightheadedness, dizziness, weakness or syncope/near syncope. No TIA/amaurosis fugax symptoms.  No claudication.  ROS: A comprehensive was performed. Review of Systems  Constitutional: Negative for malaise/fatigue.  Respiratory: Positive for shortness of breath. Negative for cough and wheezing (Much less wheezing).     Musculoskeletal: Negative for falls and joint pain.  Neurological: Negative for dizziness.  All other systems reviewed and are negative.   Past Medical History:  Diagnosis Date  . Dental crown present   . GERD (gastroesophageal reflux disease)   . Post-operative infection 09/2011   left leg  . Seasonal allergies   . Wound infection after surgery, left lateral leg 10/08/2011    Past Surgical History:  Procedure Laterality Date  . BACK SURGERY  04/1995   L5-S1  . COLONOSCOPY N/A 05/12/2015   Procedure: COLONOSCOPY;  Surgeon: Aviva Signs, MD;  Location: [facility];  Service: Gastroenterology;  Laterality: N/A;  . INCISION AND DRAINAGE OF WOUND  10/08/2011   Procedure: IRRIGATION AND DEBRIDEMENT WOUND;  Surgeon: Johnny Bridge, MD;  Location: [facility];  Service: Orthopedics;  Laterality: Left;  left left post op wound  . KNEE ARTHROSCOPY  11/2010   left knee - meniscus tear  . KNEE SURGERY  08/26/2011   hamstring tendon tear left  . NM MYOVIEW LTD  11/17/2012   Exercise 7 minutes. EF 60%. No ischemia or infarction.; Low risk  . TONSILLECTOMY     as a child  . TRANSTHORACIC ECHOCARDIOGRAM  01/2016   Normal LV function EF 5560%. No regional wall motion abnormality. 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 Mild LA dilation. Otherwise normal    Current Meds  Medication Sig  . fluticasone (FLONASE) 50 MCG/ACT nasal spray Place 1 spray into both nostrils daily.  Marland Kitchen  ipratropium (ATROVENT) 0.06 % nasal spray Place 2 sprays into both nostrils as directed.  . RABEprazole (ACIPHEX) 20 MG tablet Take 20 mg by mouth daily.  . sildenafil (VIAGRA) 100 MG tablet Take 1 tablet (100 mg total) by mouth daily as needed for erectile dysfunction.    Allergies  Allergen Reactions  . Doxycycline Nausea And Vomiting  . Adhesive [Tape] Other (See Comments)    SKIN IRRITATION    Social History   Social History  . Marital status:  Married    Spouse name: [name]  . Number of children: 2  . Years of education: 12   Occupational History  .  [employer]   Social History Main Topics  . Smoking status: Former Smoker    Packs/day: 1.00    Years: 35.00    Types: Cigarettes    Quit date: 11/01/2015  . Smokeless tobacco: Never Used  . Alcohol use 1.2 oz/week    2 Standard drinks or equivalent per week     Comment: occasionally  . Drug use: No  . Sexual activity: Not Asked   Other Topics Concern  . None   Social History Narrative   Married father of 2, Grandfather of 3. Wife's name is [name].   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   Currently smokes a pack a day, and drinks maybe 2-3 alcohol beverages a week.         Epworth Sleepiness Scale      Score: 9      --I seem to be losing my sex drive   --I feel stressed and lack motivation   --I have COPD   --I have been told that I snore   --I am overweight or am gaining weight   --I awake feeling not rested    family history includes Cancer in his paternal grandfather; Cancer - Lung in his father; Diabetes in his paternal grandmother; Emphysema in his maternal grandfather; Heart Problems in his mother; Heart failure in his maternal grandmother; Pulmonary fibrosis in his mother; Stroke in his mother.  Wt Readings from Last 3 Encounters:  07/30/16 189 lb 9.6 oz (86 kg)  04/26/16 189 lb (85.7 kg)  01/26/16 186 lb 9.6 oz (84.6 kg)    PHYSICAL EXAM BP 122/84   Pulse 76   Ht 6' (1.829 m)   Wt 189 lb 9.6 oz (86 kg)   BMI 25.71 kg/m  General appearance: alert, cooperative, appears stated age, no distress and otherwise healthy-appearing. Pleasant mood and affect. Neck: no adenopathy, no carotid bruit and no JVD Lungs: clear to auscultation bilaterally, normal percussion bilaterally and non-labored. No wheezes rales or rhonchi Heart: regular rate and rhythm, S1, S2 normal, no murmur,  click, rub or gallop; nondisplaced PMI Abdomen: soft, non-tender; bowel sounds normal; no masses,  no organomegaly; no HJR Extremities: extremities normal, atraumatic, no cyanosis, or edema Neurologic: Mental status: Alert, oriented, thought content appropriate. Nonfocal   Adult ECG Report  Rate: 76 ;  Rhythm: normal sinus rhythm and Questionable inferior MI, age undetermined.; normal axis, intervals and durations  Narrative Interpretation: Stable EKG. Normal   Other studies Reviewed: Additional studies/ records that were reviewed today include:  Recent Labs:  Lipids followed by PCP. Mildly elevated LDL. Low vitamin D    ASSESSMENT / PLAN: Problem List Items Addressed This Visit    Coronary artery calcification seen on CAT scan -  Primary (Chronic)    Now he has had 2 Myoview's without showing any significant infarct or ischemia. I do recommend continued risk factor modification with lipid management. She doesn't seem to have any problems with blood pressure. He has quit smoking, and does not have diabetes. With clear evidence of microvascular disease from erectile dysfunction, he has the foundation for vascular disease.      Relevant Orders   EKG 12-Lead (Completed)   DOE (dyspnea on exertion) (Chronic)    Very difficult to determine what the real etiology for his dyspnea is. It seems like he doesn't get any better despite him being off cigarettes. His PFTs apparently are not significant, but it could explain his shortness of breath as could deconditioning. He has had 2 stress tests now showing he does not have any evidence of ischemic CAD.  Essentially normal echocardiogram - not even a suspicion of significant diastolic dysfunction.  I just recommend he continues to exercise and hopefully as his smoking gets out of his system, his breathing will improve.         Current medicines are reviewed at length with the patient today. (+/- concerns) None The following changes have been  made: None  Patient Instructions  NO CHANGES WITH TREATMENT    Your physician wants you to follow-up ON AS NEEDED BASIS    Studies Ordered:   Orders Placed This Encounter  Procedures  . EKG 12-Lead      Glenetta Hew, M.D., M.S. Interventional Cardiologist   Pager # [phone] Phone # [phone] [address]. [place], [place] [address]

## 2016-08-01 ENCOUNTER — Encounter: Payer: Self-pay | Admitting: Cardiology

## 2016-08-01 NOTE — Assessment & Plan Note (Addendum)
Very difficult to determine what the real etiology for his dyspnea is. It seems like he doesn't get any better despite him being off cigarettes. His PFTs apparently are not significant, but it could explain his shortness of breath as could deconditioning. He has had 2 stress tests now showing he does not have any evidence of ischemic CAD.  Essentially normal echocardiogram - not even a suspicion of significant diastolic dysfunction.  I just recommend he continues to exercise and hopefully as his smoking gets out of his system, his breathing will improve.

## 2016-08-01 NOTE — Assessment & Plan Note (Signed)
Now he has had 2 Myoview's without showing any significant infarct or ischemia. I do recommend continued risk factor modification with lipid management. She doesn't seem to have any problems with blood pressure. He has quit smoking, and does not have diabetes. With clear evidence of microvascular disease from erectile dysfunction, he has the foundation for vascular disease.

## 2017-01-02 DIAGNOSIS — M19012 Primary osteoarthritis, left shoulder: Secondary | ICD-10-CM | POA: Diagnosis not present

## 2017-01-02 DIAGNOSIS — Z6824 Body mass index (BMI) 24.0-24.9, adult: Secondary | ICD-10-CM | POA: Diagnosis not present

## 2017-03-04 ENCOUNTER — Other Ambulatory Visit: Payer: Self-pay | Admitting: Internal Medicine

## 2017-03-04 DIAGNOSIS — R911 Solitary pulmonary nodule: Secondary | ICD-10-CM

## 2017-04-16 DIAGNOSIS — M7542 Impingement syndrome of left shoulder: Secondary | ICD-10-CM | POA: Diagnosis not present

## 2017-04-24 ENCOUNTER — Ambulatory Visit (HOSPITAL_COMMUNITY)
Admission: RE | Admit: 2017-04-24 | Discharge: 2017-04-24 | Disposition: A | Discharge status: Home / Self Care | Payer: BLUE CROSS/BLUE SHIELD | Source: Ambulatory Visit | Attending: Internal Medicine | Admitting: Internal Medicine

## 2017-04-24 DIAGNOSIS — R911 Solitary pulmonary nodule: Secondary | ICD-10-CM | POA: Insufficient documentation

## 2017-04-24 DIAGNOSIS — J439 Emphysema, unspecified: Secondary | ICD-10-CM | POA: Diagnosis not present

## 2017-05-13 DIAGNOSIS — R3912 Poor urinary stream: Secondary | ICD-10-CM | POA: Diagnosis not present

## 2017-05-30 ENCOUNTER — Other Ambulatory Visit (HOSPITAL_COMMUNITY): Payer: Self-pay | Admitting: Respiratory Therapy

## 2017-05-30 DIAGNOSIS — J441 Chronic obstructive pulmonary disease with (acute) exacerbation: Secondary | ICD-10-CM

## 2017-06-10 ENCOUNTER — Ambulatory Visit (HOSPITAL_COMMUNITY)
Admission: RE | Admit: 2017-06-10 | Discharge: 2017-06-10 | Disposition: A | Discharge status: Home / Self Care | Payer: BLUE CROSS/BLUE SHIELD | Source: Ambulatory Visit | Attending: Pulmonary Disease | Admitting: Pulmonary Disease

## 2017-06-10 DIAGNOSIS — J441 Chronic obstructive pulmonary disease with (acute) exacerbation: Secondary | ICD-10-CM | POA: Insufficient documentation

## 2017-06-10 LAB — PULMONARY FUNCTION TEST
DL/VA % pred: 53 %
DL/VA: 2.51 ml/min/mmHg/L
DLCO unc % pred: 50 %
DLCO unc: 16.96 ml/min/mmHg
FEF 25-75 Post: 2.17 L/sec
FEF 25-75 Pre: 2.75 L/sec
FEF2575-%CHANGE-POST: -21 %
FEF2575-%Pred-Post: 65 %
FEF2575-%Pred-Pre: 82 %
FEV1-%Change-Post: -4 %
FEV1-%Pred-Post: 88 %
FEV1-%Pred-Pre: 91 %
FEV1-POST: 3.44 L
FEV1-PRE: 3.59 L
FEV1FVC-%Change-Post: 0 %
FEV1FVC-%Pred-Pre: 97 %
FEV6-%CHANGE-POST: -4 %
FEV6-%PRED-PRE: 95 %
FEV6-%Pred-Post: 91 %
FEV6-PRE: 4.67 L
FEV6-Post: 4.48 L
FEV6FVC-%Change-Post: 0 %
FEV6FVC-%PRED-PRE: 101 %
FEV6FVC-%Pred-Post: 102 %
FVC-%CHANGE-POST: -4 %
FVC-%PRED-PRE: 94 %
FVC-%Pred-Post: 89 %
FVC-POST: 4.57 L
FVC-PRE: 4.81 L
POST FEV6/FVC RATIO: 98 %
Post FEV1/FVC ratio: 75 %
Pre FEV1/FVC ratio: 75 %
Pre FEV6/FVC Ratio: 97 %
RV % PRED: 93 %
RV: 2.05 L
TLC % pred: 103 %
TLC: 7.44 L

## 2017-06-10 MED ORDER — ALBUTEROL SULFATE (2.5 MG/3ML) 0.083% IN NEBU
2.5000 mg | INHALATION_SOLUTION | Freq: Once | RESPIRATORY_TRACT | Status: AC
  Administered 2017-06-10: 2.5 mg via RESPIRATORY_TRACT

## 2017-06-20 DIAGNOSIS — R3912 Poor urinary stream: Secondary | ICD-10-CM | POA: Diagnosis not present

## 2017-08-01 ENCOUNTER — Other Ambulatory Visit (HOSPITAL_COMMUNITY): Payer: Self-pay | Admitting: Pulmonary Disease

## 2017-08-01 DIAGNOSIS — J439 Emphysema, unspecified: Secondary | ICD-10-CM

## 2017-08-05 ENCOUNTER — Ambulatory Visit (HOSPITAL_COMMUNITY)
Admission: RE | Admit: 2017-08-05 | Discharge: 2017-08-05 | Disposition: A | Discharge status: Home / Self Care | Payer: BLUE CROSS/BLUE SHIELD | Source: Ambulatory Visit | Attending: Pulmonary Disease | Admitting: Pulmonary Disease

## 2017-08-05 DIAGNOSIS — J439 Emphysema, unspecified: Secondary | ICD-10-CM | POA: Diagnosis present

## 2017-08-05 DIAGNOSIS — I2510 Atherosclerotic heart disease of native coronary artery without angina pectoris: Secondary | ICD-10-CM | POA: Insufficient documentation

## 2017-09-22 ENCOUNTER — Ambulatory Visit (INDEPENDENT_AMBULATORY_CARE_PROVIDER_SITE_OTHER): Payer: BLUE CROSS/BLUE SHIELD | Admitting: Podiatry

## 2017-09-22 ENCOUNTER — Encounter: Payer: Self-pay | Admitting: Podiatry

## 2017-09-22 ENCOUNTER — Other Ambulatory Visit: Payer: Self-pay

## 2017-09-22 ENCOUNTER — Ambulatory Visit (INDEPENDENT_AMBULATORY_CARE_PROVIDER_SITE_OTHER): Payer: BLUE CROSS/BLUE SHIELD

## 2017-09-22 ENCOUNTER — Other Ambulatory Visit: Payer: Self-pay | Admitting: Podiatry

## 2017-09-22 VITALS — BP 135/87 | HR 69

## 2017-09-22 DIAGNOSIS — M722 Plantar fascial fibromatosis: Secondary | ICD-10-CM

## 2017-09-22 DIAGNOSIS — M79672 Pain in left foot: Secondary | ICD-10-CM | POA: Diagnosis not present

## 2017-09-22 MED ORDER — DICLOFENAC SODIUM 75 MG PO TBEC: 75.0000 mg | DELAYED_RELEASE_TABLET | Freq: Two times a day (BID) | ORAL | 2 refills | Status: DC

## 2017-09-22 MED ORDER — TRIAMCINOLONE ACETONIDE 10 MG/ML IJ SUSP
10.0000 mg | Freq: Once | INTRAMUSCULAR | Status: AC
  Administered 2017-09-22: 10 mg

## 2017-09-22 NOTE — Patient Instructions (Signed)

## 2017-09-23 ENCOUNTER — Other Ambulatory Visit: Payer: Self-pay | Admitting: Internal Medicine

## 2017-09-23 DIAGNOSIS — R911 Solitary pulmonary nodule: Secondary | ICD-10-CM

## 2017-09-24 NOTE — Progress Notes (Signed)
Subjective:   Patient ID: Peter Mahoney, male   DOB: 57 y.o.   MRN: [id number]   HPI Patient presents with a lot of discomfort in the mid arch area left with inflammation fluid buildup close to the heel but not directly at the insertional point into the calcaneus.  Patient states is been present for several months and patient does not smoke likes to be active   Review of Systems  All other systems reviewed and are negative.       Objective:  Physical Exam  Constitutional: He appears well-developed and well-nourished.  Cardiovascular: Intact distal pulses.  Pulmonary/Chest: Effort normal.  Musculoskeletal: Normal range of motion.  Neurological: He is alert.  Skin: Skin is warm.  Nursing note and vitals reviewed.   Neurovascular status found to be intact with muscle strength adequate range of motion within normal limits.  Patient is found to have inflammation pain in the plantar fascial medial side distal to the insertion into the calcaneus with fluid buildup noted.  Patient has good digital perfusion and is well oriented x3     Assessment:  Acute plantar fasciitis left inflammation fluid buildup     Plan:  H&P condition reviewed and did a careful injection of the plantar fascia distal to the insertion calcaneus 3 mg Kenalog 5 mg Xylocaine and applied fascial brace to lift the arch.  Patient will be seen back to recheck again in the next several weeks  X-ray indicates that there is spur formation no indications of stress fracture or other pathology

## 2017-09-25 DIAGNOSIS — J441 Chronic obstructive pulmonary disease with (acute) exacerbation: Secondary | ICD-10-CM | POA: Diagnosis not present

## 2017-09-25 DIAGNOSIS — N401 Enlarged prostate with lower urinary tract symptoms: Secondary | ICD-10-CM | POA: Diagnosis not present

## 2017-09-25 DIAGNOSIS — F419 Anxiety disorder, unspecified: Secondary | ICD-10-CM | POA: Diagnosis not present

## 2017-10-03 DIAGNOSIS — R3912 Poor urinary stream: Secondary | ICD-10-CM | POA: Diagnosis not present

## 2017-10-03 DIAGNOSIS — N5201 Erectile dysfunction due to arterial insufficiency: Secondary | ICD-10-CM | POA: Diagnosis not present

## 2017-10-06 ENCOUNTER — Ambulatory Visit: Payer: BLUE CROSS/BLUE SHIELD | Admitting: Podiatry

## 2017-10-08 ENCOUNTER — Encounter: Payer: Self-pay | Admitting: Podiatry

## 2017-10-08 ENCOUNTER — Ambulatory Visit (INDEPENDENT_AMBULATORY_CARE_PROVIDER_SITE_OTHER): Payer: BLUE CROSS/BLUE SHIELD | Admitting: Podiatry

## 2017-10-08 DIAGNOSIS — M722 Plantar fascial fibromatosis: Secondary | ICD-10-CM

## 2017-10-08 MED ORDER — TRIAMCINOLONE ACETONIDE 10 MG/ML IJ SUSP
10.0000 mg | Freq: Once | INTRAMUSCULAR | Status: AC
  Administered 2017-10-08: 10 mg

## 2017-10-09 NOTE — Progress Notes (Signed)
Subjective:   Patient ID: Peter Mahoney, male   DOB: 57 y.o.   MRN: [id number]   HPI Patient states arch is doing pretty well with discomfort if I do too much   ROS      Objective:  Physical Exam  Neurovascular status intact with mid arch pain left that is improved but present     Assessment:  Pain still present upon palpation to the mid arch left     Plan:  Injected the mid arch area left 3 mg Kenalog 5 mg Xylocaine advised on ice therapy and supportive shoes reappoint as needed

## 2017-10-21 ENCOUNTER — Other Ambulatory Visit: Payer: BLUE CROSS/BLUE SHIELD

## 2017-12-01 DIAGNOSIS — J449 Chronic obstructive pulmonary disease, unspecified: Secondary | ICD-10-CM | POA: Diagnosis not present

## 2017-12-01 DIAGNOSIS — R252 Cramp and spasm: Secondary | ICD-10-CM | POA: Diagnosis not present

## 2017-12-01 DIAGNOSIS — K210 Gastro-esophageal reflux disease with esophagitis: Secondary | ICD-10-CM | POA: Diagnosis not present

## 2017-12-01 DIAGNOSIS — N401 Enlarged prostate with lower urinary tract symptoms: Secondary | ICD-10-CM | POA: Diagnosis not present

## 2017-12-02 DIAGNOSIS — N401 Enlarged prostate with lower urinary tract symptoms: Secondary | ICD-10-CM | POA: Diagnosis not present

## 2017-12-02 DIAGNOSIS — J449 Chronic obstructive pulmonary disease, unspecified: Secondary | ICD-10-CM | POA: Diagnosis not present

## 2017-12-02 DIAGNOSIS — K210 Gastro-esophageal reflux disease with esophagitis: Secondary | ICD-10-CM | POA: Diagnosis not present

## 2017-12-02 DIAGNOSIS — R252 Cramp and spasm: Secondary | ICD-10-CM | POA: Diagnosis not present

## 2017-12-10 ENCOUNTER — Telehealth: Payer: Self-pay | Admitting: *Deleted

## 2017-12-10 MED ORDER — DICLOFENAC SODIUM 75 MG PO TBEC: 75.0000 mg | DELAYED_RELEASE_TABLET | Freq: Two times a day (BID) | ORAL | 0 refills | Status: DC

## 2017-12-10 NOTE — Telephone Encounter (Signed)
Refill request diclofenac. Dr. Paulla Dolly states refill once, pt needs an appt prior to future refills.

## 2017-12-24 ENCOUNTER — Encounter: Payer: Self-pay | Admitting: Podiatry

## 2017-12-24 ENCOUNTER — Ambulatory Visit (INDEPENDENT_AMBULATORY_CARE_PROVIDER_SITE_OTHER): Payer: BLUE CROSS/BLUE SHIELD | Admitting: Podiatry

## 2017-12-24 DIAGNOSIS — M722 Plantar fascial fibromatosis: Secondary | ICD-10-CM

## 2017-12-24 MED ORDER — PREDNISONE 10 MG PO TABS
ORAL_TABLET | ORAL | 0 refills | Status: DC
Start: 2017-12-24 — End: 2018-12-10

## 2017-12-24 MED ORDER — TRIAMCINOLONE ACETONIDE 10 MG/ML IJ SUSP
10.0000 mg | Freq: Once | INTRAMUSCULAR | Status: AC
  Administered 2017-12-24: 10 mg

## 2017-12-25 NOTE — Progress Notes (Signed)
Subjective:   Patient ID: Peter Mahoney, male   DOB: 57 y.o.   MRN: [id number]   HPI Patient presents with exquisite discomfort plantar aspect heel region bilateral with long-term history of this and also problems especially when he gets up in the morning after sitting with stretching.  Patient states is gradually gotten worse over time   ROS      Objective:  Physical Exam  Neurovascular status intact with inflammatory fasciitis with fluid buildup around the medial tendon at its insertion of the calcaneus both feet with depression of the arch noted and tightness of the plantar fascial band noted right over left foot.  Patient was noted to have good digital perfusion well oriented x3     Assessment:  Acute plantar fasciitis bilateral with inflammation fluid in the medial band with depression of the arch noted and tight plantar fascial bilateral     Plan:  H&P conditions reviewed and will get a focus on the acute inflammation today along with the chronic nature of this condition.  I went ahead today and I injected the plantar fascial bilateral 3 mg Kenalog 5 mg Xylocaine I dispensed a night splint with all instructions on usage along with ice therapy and I scanned for custom orthotics to reduce plantar pressure on the feet.  Patient will be seen back when orthotics are ready by Liliane Channel and will be seen back by me depending on the response to this conservative treatment regimen  X-ray indicates there is small spur is no indication stress fracture arthritis

## 2018-01-14 ENCOUNTER — Ambulatory Visit: Payer: BLUE CROSS/BLUE SHIELD | Admitting: Orthotics

## 2018-01-14 DIAGNOSIS — M722 Plantar fascial fibromatosis: Secondary | ICD-10-CM

## 2018-01-14 NOTE — Progress Notes (Signed)
Patient came in today to pick up custom made foot orthotics.  The goals were accomplished and the patient reported no dissatisfaction with said orthotics.  Patient was advised of breakin period and how to report any issues. 

## 2018-02-03 DIAGNOSIS — R3912 Poor urinary stream: Secondary | ICD-10-CM | POA: Diagnosis not present

## 2018-02-03 DIAGNOSIS — N5201 Erectile dysfunction due to arterial insufficiency: Secondary | ICD-10-CM | POA: Diagnosis not present

## 2018-02-17 DIAGNOSIS — N5201 Erectile dysfunction due to arterial insufficiency: Secondary | ICD-10-CM | POA: Diagnosis not present

## 2018-02-26 ENCOUNTER — Ambulatory Visit (INDEPENDENT_AMBULATORY_CARE_PROVIDER_SITE_OTHER): Payer: BLUE CROSS/BLUE SHIELD | Admitting: Podiatry

## 2018-02-26 ENCOUNTER — Encounter: Payer: Self-pay | Admitting: Podiatry

## 2018-02-26 DIAGNOSIS — M722 Plantar fascial fibromatosis: Secondary | ICD-10-CM

## 2018-02-26 MED ORDER — TRIAMCINOLONE ACETONIDE 10 MG/ML IJ SUSP
10.0000 mg | Freq: Once | INTRAMUSCULAR | Status: AC
  Administered 2018-02-26: 10 mg

## 2018-02-26 MED ORDER — PREDNISONE 10 MG PO TABS: ORAL_TABLET | ORAL | 0 refills | Status: DC

## 2018-02-26 NOTE — Patient Instructions (Signed)
For instructions on how to put on your Cam Walking Boot, please visit [email or web address] Plantar Fasciitis Plantar fasciitis is a painful foot condition that affects the heel. It occurs when the band of tissue that connects the toes to the heel bone (plantar fascia) becomes irritated. This can happen after exercising too much or doing other repetitive activities (overuse injury). The pain from plantar fasciitis can range from mild irritation to severe pain that makes it difficult for you to walk or move. The pain is usually worse in the morning or after you have been sitting or lying down for a while. What are the causes? This condition may be caused by:  Standing for long periods of time.  Wearing shoes that do not fit.  Doing high-impact activities, including running, aerobics, and ballet.  Being overweight.  Having an abnormal way of walking (gait).  Having tight calf muscles.  Having high arches in your feet.  Starting a new athletic activity.  What are the signs or symptoms? The main symptom of this condition is heel pain. Other symptoms include:  Pain that gets worse after activity or exercise.  Pain that is worse in the morning or after resting.  Pain that goes away after you walk for a few minutes.  How is this diagnosed? This condition may be diagnosed based on your signs and symptoms. Your health care provider will also do a physical exam to check for:  A tender area on the bottom of your foot.  A high arch in your foot.  Pain when you move your foot.  Difficulty moving your foot.  You may also need to have imaging studies to confirm the diagnosis. These can include:  X-rays.  Ultrasound.  MRI.  How is this treated? Treatment for plantar fasciitis depends on the severity of the condition. Your treatment may include:  Rest, ice, and over-the-counter pain medicines to manage your pain.  Exercises to stretch your calves and your plantar fascia.  A  splint that holds your foot in a stretched, upward position while you sleep (night splint).  Physical therapy to relieve symptoms and prevent problems in the future.  Cortisone injections to relieve severe pain.  Extracorporeal shock wave therapy (ESWT) to stimulate damaged plantar fascia with electrical impulses. It is often used as a last resort before surgery.  Surgery, if other treatments have not worked after 12 months.  Follow these instructions at home:  Take medicines only as directed by your health care provider.  Avoid activities that cause pain.  Roll the bottom of your foot over a bag of ice or a bottle of cold water. Do this for 20 minutes, 3-4 times a day.  Perform simple stretches as directed by your health care provider.  Try wearing athletic shoes with air-sole or gel-sole cushions or soft shoe inserts.  Wear a night splint while sleeping, if directed by your health care provider.  Keep all follow-up appointments with your health care provider. How is this prevented?  Do not perform exercises or activities that cause heel pain.  Consider finding low-impact activities if you continue to have problems.  Lose weight if you need to. The best way to prevent plantar fasciitis is to avoid the activities that aggravate your plantar fascia. Contact a health care provider if:  Your symptoms do not go away after treatment with home care measures.  Your pain gets worse.  Your pain affects your ability to move or do your daily activities. This information is not intended  to replace advice given to you by your health care provider. Make sure you discuss any questions you have with your health care provider. Document Released: 01/01/2001 Document Revised: 09/11/2015 Document Reviewed: 02/16/2014 Elsevier Interactive Patient Education  [employer].

## 2018-02-26 NOTE — Progress Notes (Signed)
Subjective:   Patient ID: Peter Mahoney, male   DOB: 57 y.o.   MRN: [id number]   HPI Patient presents with severe pain in the left plantar heel stating that its been worse since last visit and he has to work a job where he is on his feet all the time   ROS      Objective:  Physical Exam  Neurovascular status intact with patient found to have continued exquisite discomfort left plantar fascial insertional point tendon calcaneus slightly distal to its insertion into the calcaneus     Assessment:  Acute plantar fasciitis left with inflammation fluid around the medial band     Plan:  Reviewed condition at great length in the type of work he does and the fact we may need to do surgery on this due to the intensity of his discomfort we will get a try again to work on this conservatively.  I did do an injection of the fascia more distal to its insertion calcaneus and I applied an air fracture walker to completely immobilize and let it rest advised on aggressive ice and placed on Sterapred DS 12-day Dosepak.  Reappoint 4 weeks and if symptoms are not under control will have to consider surgical endoscopic surgery

## 2018-03-26 ENCOUNTER — Ambulatory Visit (INDEPENDENT_AMBULATORY_CARE_PROVIDER_SITE_OTHER): Payer: BLUE CROSS/BLUE SHIELD | Admitting: Podiatry

## 2018-03-26 ENCOUNTER — Encounter: Payer: Self-pay | Admitting: Podiatry

## 2018-03-26 DIAGNOSIS — M722 Plantar fascial fibromatosis: Secondary | ICD-10-CM

## 2018-03-26 NOTE — Progress Notes (Signed)
Subjective:   Patient ID: Peter Mahoney, male   DOB: 57 y.o.   MRN: [id number]   HPI Patient states that he is doing a lot better with minimal discomfort and able to walk without pain   ROS      Objective:  Physical Exam  Neurovascular status intact with significant diminishment of discomfort plantar aspect left heel     Assessment:  Plantar fasciitis left improved     Plan:  Advised on physical therapy anti-inflammatories and supportive shoes.  Patient will be seen back on an as-needed basis

## 2018-04-08 DIAGNOSIS — J439 Emphysema, unspecified: Secondary | ICD-10-CM | POA: Diagnosis not present

## 2018-04-08 DIAGNOSIS — R062 Wheezing: Secondary | ICD-10-CM | POA: Diagnosis not present

## 2018-04-08 DIAGNOSIS — J029 Acute pharyngitis, unspecified: Secondary | ICD-10-CM | POA: Diagnosis not present

## 2018-04-08 DIAGNOSIS — N401 Enlarged prostate with lower urinary tract symptoms: Secondary | ICD-10-CM | POA: Diagnosis not present

## 2018-04-08 DIAGNOSIS — F5221 Male erectile disorder: Secondary | ICD-10-CM | POA: Diagnosis not present

## 2018-04-09 ENCOUNTER — Other Ambulatory Visit (HOSPITAL_COMMUNITY): Payer: Self-pay | Admitting: Respiratory Therapy

## 2018-04-09 DIAGNOSIS — J439 Emphysema, unspecified: Secondary | ICD-10-CM

## 2018-05-18 ENCOUNTER — Ambulatory Visit: Payer: BLUE CROSS/BLUE SHIELD | Admitting: Podiatry

## 2018-05-20 ENCOUNTER — Ambulatory Visit (HOSPITAL_COMMUNITY)
Admission: RE | Admit: 2018-05-20 | Discharge: 2018-05-20 | Disposition: A | Discharge status: Home / Self Care | Payer: BLUE CROSS/BLUE SHIELD | Source: Ambulatory Visit | Attending: Pulmonary Disease | Admitting: Pulmonary Disease

## 2018-05-20 DIAGNOSIS — J439 Emphysema, unspecified: Secondary | ICD-10-CM | POA: Insufficient documentation

## 2018-05-20 LAB — PULMONARY FUNCTION TEST
DL/VA % PRED: 56 %
DL/VA: 2.63 ml/min/mmHg/L
DLCO unc % pred: 48 %
DLCO unc: 16.33 ml/min/mmHg
FEF 25-75 POST: 2.6 L/s
FEF 25-75 Pre: 2.82 L/sec
FEF2575-%Change-Post: -8 %
FEF2575-%PRED-POST: 78 %
FEF2575-%Pred-Pre: 85 %
FEV1-%CHANGE-POST: -2 %
FEV1-%PRED-PRE: 91 %
FEV1-%Pred-Post: 88 %
FEV1-PRE: 3.56 L
FEV1-Post: 3.47 L
FEV1FVC-%Change-Post: -1 %
FEV1FVC-%PRED-PRE: 99 %
FEV6-%Change-Post: -2 %
FEV6-%Pred-Post: 91 %
FEV6-%Pred-Pre: 93 %
FEV6-Post: 4.47 L
FEV6-Pre: 4.59 L
FEV6FVC-%Change-Post: -1 %
FEV6FVC-%PRED-PRE: 102 %
FEV6FVC-%Pred-Post: 100 %
FVC-%Change-Post: -1 %
FVC-%PRED-POST: 91 %
FVC-%PRED-PRE: 92 %
FVC-POST: 4.64 L
FVC-PRE: 4.68 L
POST FEV1/FVC RATIO: 75 %
PRE FEV6/FVC RATIO: 98 %
Post FEV6/FVC ratio: 97 %
Pre FEV1/FVC ratio: 76 %
RV % pred: 95 %
RV: 2.1 L
TLC % pred: 94 %
TLC: 6.76 L

## 2018-05-20 MED ORDER — ALBUTEROL SULFATE (2.5 MG/3ML) 0.083% IN NEBU
2.5000 mg | INHALATION_SOLUTION | Freq: Once | RESPIRATORY_TRACT | Status: AC
  Administered 2018-05-20: 2.5 mg via RESPIRATORY_TRACT

## 2018-05-21 ENCOUNTER — Encounter: Payer: Self-pay | Admitting: Podiatry

## 2018-05-21 ENCOUNTER — Ambulatory Visit (INDEPENDENT_AMBULATORY_CARE_PROVIDER_SITE_OTHER): Payer: BLUE CROSS/BLUE SHIELD | Admitting: Podiatry

## 2018-05-21 ENCOUNTER — Ambulatory Visit (INDEPENDENT_AMBULATORY_CARE_PROVIDER_SITE_OTHER): Payer: BLUE CROSS/BLUE SHIELD

## 2018-05-21 DIAGNOSIS — M722 Plantar fascial fibromatosis: Secondary | ICD-10-CM

## 2018-05-21 NOTE — Patient Instructions (Signed)
Pre-Operative Instructions  Congratulations, you have decided to take an important step towards improving your quality of life.  You can be assured that the doctors and staff at [facility] will be with you every step of the way.  Here are some important things you should know:  1. Plan to be at the surgery center/hospital at least 1 (one) hour prior to your scheduled time, unless otherwise directed by the surgical center/hospital staff.  You must have a responsible adult accompany you, remain during the surgery and drive you home.  Make sure you have directions to the surgical center/hospital to ensure you arrive on time. 2. If you are having surgery at [facility] or [facility] hospitals, you will need a copy of your medical history and physical form from your family physician within one month prior to the date of surgery. We will give you a form for your primary physician to complete.  3. We make every effort to accommodate the date you request for surgery.  However, there are times where surgery dates or times have to be moved.  We will contact you as soon as possible if a change in schedule is required.   4. No aspirin/ibuprofen for one week before surgery.  If you are on aspirin, any non-steroidal anti-inflammatory medications (Mobic, Aleve, Ibuprofen) should not be taken seven (7) days prior to your surgery.  You make take Tylenol for pain prior to surgery.  5. Medications - If you are taking daily heart and blood pressure medications, seizure, reflux, allergy, asthma, anxiety, pain or diabetes medications, make sure you notify the surgery center/hospital before the day of surgery so they can tell you which medications you should take or avoid the day of surgery. 6. No food or drink after midnight the night before surgery unless directed otherwise by surgical center/hospital staff. 7. No alcoholic beverages 24-hours prior to surgery.  No smoking 24-hours prior or 24-hours after  surgery. 8. Wear loose pants or shorts. They should be loose enough to fit over bandages, boots, and casts. 9. Don't wear slip-on shoes. Sneakers are preferred. 10. Bring your boot with you to the surgery center/hospital.  Also bring crutches or a walker if your physician has prescribed it for you.  If you do not have this equipment, it will be provided for you after surgery. 11. If you have not been contacted by the surgery center/hospital by the day before your surgery, call to confirm the date and time of your surgery. 12. Leave-time from work may vary depending on the type of surgery you have.  Appropriate arrangements should be made prior to surgery with your employer. 13. Prescriptions will be provided immediately following surgery by your doctor.  Fill these as soon as possible after surgery and take the medication as directed. Pain medications will not be refilled on weekends and must be approved by the doctor. 14. Remove nail polish on the operative foot and avoid getting pedicures prior to surgery. 15. Wash the night before surgery.  The night before surgery wash the foot and leg well with water and the antibacterial soap provided. Be sure to pay special attention to beneath the toenails and in between the toes.  Wash for at least three (3) minutes. Rinse thoroughly with water and dry well with a towel.  Perform this wash unless told not to do so by your physician.  Enclosed: 1 Ice pack (please put in freezer the night before surgery)   1 Hibiclens skin cleaner     Pre-op instructions  If you have any questions regarding the instructions, please do not hesitate to call our office.  [place]: [address], [place], [place] [address] -- [phone]  [place]: [address] [place], [place] [address] -- [phone]  [place]: [address]  [place], [place] [address] -- [phone]  [place]: [address], [address], [place], [place] [address] -- [phone]  Website:  [email or web address] 

## 2018-05-22 NOTE — Progress Notes (Signed)
Subjective:   Patient ID: Peter Mahoney, male   DOB: 58 y.o.   MRN: [id number]   HPI Patient presents stating this heel is absolutely killing me again and it got better for a few weeks when I was off work but the minute I started working again and it started to get sore and a need to have it fixed   ROS      Objective:  Physical Exam  Neurovascular status intact with patient found to have acute inflammation of the plantar fascia at its insertion calcaneus left and slightly distal.  Is very sore when pressed and makes walking difficult     Assessment:  Acute medial band plantar fasciitis left that is not responded conservatively     Plan:  H&P condition reviewed and recommended endoscopic release.  At this point I allowed patient to read consent form going over alternative treatments complications associated with this and patient understands this and understands risk and the fact that the arch may be sore for an extended period of time.  Patient is scheduled for outpatient surgery and is encouraged to call with questions and understands total recovery will be 6 months to 1 year for this procedure.  Patient did sign consent form today  X-ray dated today did not indicate signs of fracture or other bone changes associated with this condition

## 2018-05-26 DIAGNOSIS — M79676 Pain in unspecified toe(s): Secondary | ICD-10-CM

## 2018-06-05 ENCOUNTER — Telehealth: Payer: Self-pay | Admitting: *Deleted

## 2018-06-05 NOTE — Telephone Encounter (Signed)
"  I see on my FMLA paperwork that I'm supposed to use a knee scooter.  How do I go about getting this?  Will I get it at the surgery center?"  For your procedure, you will not need a knee scooter.  That must have been listed in error.  You will be wearing your boot.  "Okay great, thanks for easing my mind."

## 2018-06-16 ENCOUNTER — Encounter: Payer: Self-pay | Admitting: Podiatry

## 2018-06-16 DIAGNOSIS — M722 Plantar fascial fibromatosis: Secondary | ICD-10-CM | POA: Diagnosis not present

## 2018-06-16 DIAGNOSIS — K219 Gastro-esophageal reflux disease without esophagitis: Secondary | ICD-10-CM | POA: Diagnosis not present

## 2018-06-17 ENCOUNTER — Telehealth: Payer: Self-pay | Admitting: *Deleted

## 2018-06-17 NOTE — Telephone Encounter (Signed)
Called and left a message for the patient to call me back-I was checking on the patient to see how he was doing after having surgery with Dr Paulla Dolly on Tuesday June 16, 2018 and stated to call the [place] office at [phone]. Lattie Haw

## 2018-06-25 ENCOUNTER — Ambulatory Visit (INDEPENDENT_AMBULATORY_CARE_PROVIDER_SITE_OTHER): Payer: BLUE CROSS/BLUE SHIELD | Admitting: Podiatry

## 2018-06-25 ENCOUNTER — Encounter: Payer: Self-pay | Admitting: Podiatry

## 2018-06-25 DIAGNOSIS — Z09 Encounter for follow-up examination after completed treatment for conditions other than malignant neoplasm: Secondary | ICD-10-CM

## 2018-06-25 DIAGNOSIS — M722 Plantar fascial fibromatosis: Secondary | ICD-10-CM

## 2018-06-28 NOTE — Progress Notes (Signed)
Subjective:   Patient ID: Peter Mahoney, male   DOB: 58 y.o.   MRN: [id number]   HPI Patient states overall doing really well with minimal discomfort   ROS      Objective:  Physical Exam  Neurovascular status intact negative Homans sign noted with wound edges left heel healing well medial and lateral with stitches in place and only mild plantar pain noted      Assessment:  Doing well post endoscopic procedure left heel     Plan:  H&P condition reviewed reapplied sterile dressing instructed on continued immobilization compression elevation and reappoint to recheck

## 2018-07-08 DIAGNOSIS — J439 Emphysema, unspecified: Secondary | ICD-10-CM | POA: Diagnosis not present

## 2018-07-08 DIAGNOSIS — N401 Enlarged prostate with lower urinary tract symptoms: Secondary | ICD-10-CM | POA: Diagnosis not present

## 2018-07-08 DIAGNOSIS — K210 Gastro-esophageal reflux disease with esophagitis: Secondary | ICD-10-CM | POA: Diagnosis not present

## 2018-07-09 ENCOUNTER — Encounter: Payer: Self-pay | Admitting: Podiatry

## 2018-07-09 ENCOUNTER — Other Ambulatory Visit: Payer: Self-pay

## 2018-07-09 ENCOUNTER — Ambulatory Visit (INDEPENDENT_AMBULATORY_CARE_PROVIDER_SITE_OTHER): Payer: BLUE CROSS/BLUE SHIELD | Admitting: Podiatry

## 2018-07-09 DIAGNOSIS — Z09 Encounter for follow-up examination after completed treatment for conditions other than malignant neoplasm: Secondary | ICD-10-CM

## 2018-07-09 DIAGNOSIS — M722 Plantar fascial fibromatosis: Secondary | ICD-10-CM

## 2018-07-09 NOTE — Progress Notes (Signed)
Subjective:   Patient ID: Peter Mahoney, male   DOB: 58 y.o.   MRN: [id number]   HPI Patient states doing real well with surgery with minimal discomfort   ROS      Objective:  Physical Exam  Neurovascular status intact negative Homans sign noted wound edges healed well with stitches in place left heel with minimal discomfort upon palpation     Assessment:  Doing well post endoscopic release left heel     Plan:  H&P condition reviewed stitches removed sterile dressings applied and advised on compression and gradual return to soft shoe and dispensed ankle compression stocking.  Reappoint as needed and encouraged him to let us know of any issues were to occur

## 2018-07-27 DIAGNOSIS — M79676 Pain in unspecified toe(s): Secondary | ICD-10-CM

## 2018-08-04 ENCOUNTER — Other Ambulatory Visit (HOSPITAL_COMMUNITY): Payer: Self-pay | Admitting: Pulmonary Disease

## 2018-08-04 DIAGNOSIS — R911 Solitary pulmonary nodule: Secondary | ICD-10-CM

## 2018-08-04 DIAGNOSIS — Z87891 Personal history of nicotine dependence: Secondary | ICD-10-CM

## 2018-08-12 ENCOUNTER — Encounter: Payer: Self-pay | Admitting: Podiatry

## 2018-08-12 ENCOUNTER — Other Ambulatory Visit: Payer: Self-pay

## 2018-08-12 ENCOUNTER — Ambulatory Visit (INDEPENDENT_AMBULATORY_CARE_PROVIDER_SITE_OTHER): Payer: BLUE CROSS/BLUE SHIELD

## 2018-08-12 ENCOUNTER — Ambulatory Visit (INDEPENDENT_AMBULATORY_CARE_PROVIDER_SITE_OTHER): Payer: BLUE CROSS/BLUE SHIELD | Admitting: Podiatry

## 2018-08-12 ENCOUNTER — Other Ambulatory Visit: Payer: Self-pay | Admitting: Podiatry

## 2018-08-12 VITALS — Temp 97.7°F

## 2018-08-12 DIAGNOSIS — S8492XA Injury of unspecified nerve at lower leg level, left leg, initial encounter: Secondary | ICD-10-CM | POA: Diagnosis not present

## 2018-08-12 DIAGNOSIS — M722 Plantar fascial fibromatosis: Secondary | ICD-10-CM

## 2018-08-12 DIAGNOSIS — M79675 Pain in left toe(s): Secondary | ICD-10-CM

## 2018-08-12 MED ORDER — GABAPENTIN 300 MG PO CAPS: 300.0000 mg | ORAL_CAPSULE | Freq: Three times a day (TID) | ORAL | 3 refills | Status: DC

## 2018-08-12 NOTE — Progress Notes (Signed)
Subjective:   Patient ID: Peter Mahoney, male   DOB: 58 y.o.   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  Reappoint to recheck  X-ray was negative for signs of bony injury or spur formation dorsal aspect metatarsal

## 2018-08-18 DIAGNOSIS — Z6826 Body mass index (BMI) 26.0-26.9, adult: Secondary | ICD-10-CM | POA: Diagnosis not present

## 2018-08-18 DIAGNOSIS — M7542 Impingement syndrome of left shoulder: Secondary | ICD-10-CM | POA: Diagnosis not present

## 2018-08-18 DIAGNOSIS — E663 Overweight: Secondary | ICD-10-CM | POA: Diagnosis not present

## 2018-08-18 DIAGNOSIS — Z1389 Encounter for screening for other disorder: Secondary | ICD-10-CM | POA: Diagnosis not present

## 2018-09-03 ENCOUNTER — Other Ambulatory Visit: Payer: Self-pay | Admitting: Podiatry

## 2018-09-07 ENCOUNTER — Telehealth: Payer: Self-pay | Admitting: Podiatry

## 2018-09-07 NOTE — Telephone Encounter (Signed)
Pt needs a work to release not. He returns 09/15/2018. Please call patient

## 2018-09-09 ENCOUNTER — Encounter: Payer: Self-pay | Admitting: Podiatry

## 2018-09-24 ENCOUNTER — Other Ambulatory Visit (HOSPITAL_COMMUNITY): Payer: Self-pay | Admitting: Pulmonary Disease

## 2018-09-24 ENCOUNTER — Other Ambulatory Visit: Payer: Self-pay

## 2018-09-24 ENCOUNTER — Ambulatory Visit (HOSPITAL_COMMUNITY)
Admission: RE | Admit: 2018-09-24 | Discharge: 2018-09-24 | Disposition: A | Discharge status: Home / Self Care | Payer: BLUE CROSS/BLUE SHIELD | Source: Ambulatory Visit | Attending: Pulmonary Disease | Admitting: Pulmonary Disease

## 2018-09-24 DIAGNOSIS — R911 Solitary pulmonary nodule: Secondary | ICD-10-CM

## 2018-09-24 DIAGNOSIS — Z87891 Personal history of nicotine dependence: Secondary | ICD-10-CM

## 2018-10-05 DIAGNOSIS — J439 Emphysema, unspecified: Secondary | ICD-10-CM | POA: Diagnosis not present

## 2018-10-05 DIAGNOSIS — N401 Enlarged prostate with lower urinary tract symptoms: Secondary | ICD-10-CM | POA: Diagnosis not present

## 2018-10-05 DIAGNOSIS — G4700 Insomnia, unspecified: Secondary | ICD-10-CM | POA: Diagnosis not present

## 2018-10-05 DIAGNOSIS — K210 Gastro-esophageal reflux disease with esophagitis: Secondary | ICD-10-CM | POA: Diagnosis not present

## 2018-10-13 DIAGNOSIS — F419 Anxiety disorder, unspecified: Secondary | ICD-10-CM | POA: Diagnosis not present

## 2018-10-13 DIAGNOSIS — J439 Emphysema, unspecified: Secondary | ICD-10-CM | POA: Diagnosis not present

## 2018-10-13 DIAGNOSIS — R001 Bradycardia, unspecified: Secondary | ICD-10-CM | POA: Diagnosis not present

## 2018-10-21 DIAGNOSIS — R001 Bradycardia, unspecified: Secondary | ICD-10-CM | POA: Diagnosis not present

## 2018-10-22 ENCOUNTER — Ambulatory Visit (INDEPENDENT_AMBULATORY_CARE_PROVIDER_SITE_OTHER): Payer: BC Managed Care – PPO

## 2018-10-22 ENCOUNTER — Other Ambulatory Visit: Payer: Self-pay | Admitting: *Deleted

## 2018-10-22 ENCOUNTER — Other Ambulatory Visit: Payer: Self-pay

## 2018-10-22 DIAGNOSIS — R001 Bradycardia, unspecified: Secondary | ICD-10-CM

## 2018-12-08 ENCOUNTER — Other Ambulatory Visit: Payer: Self-pay | Admitting: Podiatry

## 2018-12-10 ENCOUNTER — Other Ambulatory Visit: Payer: Self-pay

## 2018-12-10 ENCOUNTER — Encounter: Payer: Self-pay | Admitting: Podiatry

## 2018-12-10 ENCOUNTER — Ambulatory Visit (INDEPENDENT_AMBULATORY_CARE_PROVIDER_SITE_OTHER): Payer: BLUE CROSS/BLUE SHIELD | Admitting: Podiatry

## 2018-12-10 VITALS — Temp 98.6°F

## 2018-12-10 DIAGNOSIS — M722 Plantar fascial fibromatosis: Secondary | ICD-10-CM

## 2018-12-10 DIAGNOSIS — S8492XA Injury of unspecified nerve at lower leg level, left leg, initial encounter: Secondary | ICD-10-CM | POA: Diagnosis not present

## 2018-12-11 NOTE — Progress Notes (Signed)
Subjective:   Patient ID: Peter Mahoney, male   DOB: 58 y.o.   MRN: [id number]   HPI Patient states he still getting some numbness but feels like he is about 50% better and still gets an occasional swelling and does have lung issues and is looking to get disability because of this   ROS      Objective:  Physical Exam  Neurovascular status intact with patient's left heel healed well from surgery and found to have slight neuropathic changes left foot but not significant     Assessment:  Reviewed that this could possibly due to back compression or other systemic pathology versus localized process     Plan:  H&P condition reviewed and I recommended utilization of anti-inflammatories continue gabapentin and consideration for nerve conduction studies EMG studies if symptoms do not improve in the next few months

## 2018-12-21 ENCOUNTER — Telehealth: Payer: Self-pay | Admitting: Cardiovascular Disease

## 2018-12-21 ENCOUNTER — Encounter: Payer: Self-pay | Admitting: Cardiovascular Disease

## 2018-12-21 ENCOUNTER — Ambulatory Visit (INDEPENDENT_AMBULATORY_CARE_PROVIDER_SITE_OTHER): Payer: BC Managed Care – PPO | Admitting: Cardiovascular Disease

## 2018-12-21 ENCOUNTER — Other Ambulatory Visit: Payer: Self-pay

## 2018-12-21 VITALS — BP 123/79 | HR 62 | Temp 97.1°F | Ht 70.5 in | Wt 189.0 lb

## 2018-12-21 DIAGNOSIS — R5383 Other fatigue: Secondary | ICD-10-CM

## 2018-12-21 DIAGNOSIS — R001 Bradycardia, unspecified: Secondary | ICD-10-CM

## 2018-12-21 DIAGNOSIS — R0609 Other forms of dyspnea: Secondary | ICD-10-CM | POA: Diagnosis not present

## 2018-12-21 DIAGNOSIS — R071 Chest pain on breathing: Secondary | ICD-10-CM

## 2018-12-21 DIAGNOSIS — J439 Emphysema, unspecified: Secondary | ICD-10-CM

## 2018-12-21 MED ORDER — METOPROLOL TARTRATE 100 MG PO TABS: ORAL_TABLET | ORAL | 1 refills | Status: DC

## 2018-12-21 NOTE — Telephone Encounter (Signed)

## 2018-12-21 NOTE — Addendum Note (Signed)
Addended by: Barbarann Ehlers A on: 12/21/2018 09:27 AM   Modules accepted: Orders

## 2018-12-21 NOTE — Progress Notes (Signed)
CARDIOLOGY CONSULT NOTE  Patient ID: Peter Mahoney MRN: [id number] DOB/AGE: November 02, 1960 58 y.o.  Admit date: (Not on file) Primary Physician: [name], MD Referring Physician: Dr. Sinda Du  Reason for Consultation: Bradycardia  HPI: Peter Mahoney is a 58 y.o. male who is being seen today for the evaluation of bradycardia at the request of Sinda Du, MD.   I reviewed notes and labs from his PCP performed on 10/13/2018: Creatinine 1.02, TSH 1.34, BUN 9.  He saw Dr. Luan Pulling in June 2020.  He had been complaining of weakness and fatigue and lightheadedness when he bent over to pick something up.  Symptoms have been going on for about 5 days.  He had called him earlier in told him that his heart rate got down to 44 bpm.  He denies any chest pain at that time.  He has shortness of breath and has a prior history of smoking.  He has significant COPD with emphysema.  I reviewed the chest CT from June 2020 which showed mild diffuse bronchial wall thickening with mild to moderate centrilobular and paraseptal emphysema all suggestive of underlying COPD.  I personally reviewed an ECG performed on 10/13/2018 which demonstrated sinus bradycardia, 54 bpm.  He wore an event monitor performed in July 2020 which demonstrated normal sinus rhythm with 1 asymptomatic run of a wide complex tachycardia lasting 4 beats.  Symptoms of fatigue and dyspnea did not correlate with any arrhythmia.  Previous nuclear stress test did not show any significant infarct or ischemia.  He was last evaluated by Dr. Ellyn Hack in April 2018.  He brought in blood pressure and heart rate log.  His heart rates are in the low 50s in the morning.  He has had episodic blood pressure readings in the 90/50 range while he was at rest.  He has some fatigue at those times.  He has some sporadic left-sided chest discomfort lasting 3 to 4 seconds which has not been bothersome.  It does not occur with exertion.  O2  saturations have been in the high 80s/low 90s.  Social history: His wife, Peter Mahoney, is also my patient.   Allergies  Allergen Reactions  . Dilaudid [Hydromorphone Hcl] Other (See Comments)    Urinary retention  . Doxycycline Nausea And Vomiting  . Adhesive [Tape] Other (See Comments)    SKIN IRRITATION    Current Outpatient Medications  Medication Sig Dispense Refill  . diazepam (VALIUM) 10 MG tablet     . gabapentin (NEURONTIN) 300 MG capsule TAKE 1 CAPSULE BY MOUTH THREE TIMES A DAY (Patient taking differently: 300 mg daily. ) 270 capsule 0  . RABEprazole (ACIPHEX) 20 MG tablet Take 20 mg by mouth daily.    . sildenafil (VIAGRA) 100 MG tablet Take 1 tablet (100 mg total) by mouth daily as needed for erectile dysfunction. 10 tablet 6  . tamsulosin (FLOMAX) 0.4 MG CAPS capsule Take 0.4 mg by mouth.    . TRELEGY ELLIPTA 100-62.5-25 MCG/INH AEPB      No current facility-administered medications for this visit.     Past Medical History:  Diagnosis Date  . Dental crown present   . GERD (gastroesophageal reflux disease)   . Post-operative infection 09/2011   left leg  . Seasonal allergies   . Wound infection after surgery, left lateral leg 10/08/2011    Past Surgical History:  Procedure Laterality Date  . BACK SURGERY  04/1995   L5-S1  . COLONOSCOPY N/A 05/12/2015  Procedure: COLONOSCOPY;  Surgeon: Aviva Signs, MD;  Location: [facility];  Service: Gastroenterology;  Laterality: N/A;  . INCISION AND DRAINAGE OF WOUND  10/08/2011   Procedure: IRRIGATION AND DEBRIDEMENT WOUND;  Surgeon: Johnny Bridge, MD;  Location: [place];  Service: Orthopedics;  Laterality: Left;  left left post op wound  . KNEE ARTHROSCOPY  11/2010   left knee - meniscus tear  . KNEE SURGERY  08/26/2011   hamstring tendon tear left  . NM MYOVIEW LTD  11/17/2012   Exercise 7 minutes. EF 60%. No ischemia or infarction.; Low risk  . TONSILLECTOMY     as a child  . TRANSTHORACIC ECHOCARDIOGRAM   01/2016   Normal LV function EF 5560%. No regional wall motion abnormality. 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 Mild LA dilation. Otherwise normal    Social History   Socioeconomic History  . Marital status: Married    Spouse name: Peter Mahoney  . Number of children: 2  . Years of education: 72  . Highest education level: Not on file  Occupational History    Employer: [employer]    Comment: [employer]  Social Needs  . Financial resource strain: Not on file  . Food insecurity    Worry: Not on file    Inability: Not on file  . Transportation needs    Medical: Not on file    Non-medical: Not on file  Tobacco Use  . Smoking status: Former Smoker    Packs/day: 1.00    Years: 35.00    Pack years: 35.00    Types: Cigarettes    Quit date: 11/01/2015    Years since quitting: 3.1  . Smokeless tobacco: Never Used  Substance and Sexual Activity  . Alcohol use: Yes    Alcohol/week: 2.0 standard drinks    Types: 2 Standard drinks or equivalent per week    Comment: occasionally  . Drug use: No  . Sexual activity: Not on file  Lifestyle  . Physical activity    Days per week: Not on file    Minutes per session: Not on file  . Stress: Not on file  Relationships  . Social Herbalist on phone: Not on file    Gets together: Not on file    Attends religious service: Not on file    Active member of club or organization: Not on file    Attends meetings of clubs or organizations: Not on file    Relationship status: Not on file  . Intimate partner violence    Fear of current or ex partner: Not on file    Emotionally abused: Not on file    Physically abused: Not on file    Forced sexual activity: Not on file  Other Topics Concern  . Not on file  Social History Narrative   Married father of 2, Grandfather of 3. Wife's name is Peter Mahoney.   He works as a Animal nutritionist for [employer] Does not exercise because he "does not  have time.   Currently smokes a pack a day, and drinks maybe 2-3 alcohol beverages a week.         Epworth Sleepiness Scale      Score: 9      --I seem to be losing my sex drive   --I feel stressed and lack motivation   --I have COPD   --I have been told that I  snore   --I am overweight or am gaining weight   --I awake feeling not rested     No family history of premature CAD in 1st degree relatives.  Current Meds  Medication Sig  . diazepam (VALIUM) 10 MG tablet   . gabapentin (NEURONTIN) 300 MG capsule TAKE 1 CAPSULE BY MOUTH THREE TIMES A DAY (Patient taking differently: 300 mg daily. )  . RABEprazole (ACIPHEX) 20 MG tablet Take 20 mg by mouth daily.  . sildenafil (VIAGRA) 100 MG tablet Take 1 tablet (100 mg total) by mouth daily as needed for erectile dysfunction.  . tamsulosin (FLOMAX) 0.4 MG CAPS capsule Take 0.4 mg by mouth.  Viviana Simpler ELLIPTA 100-62.5-25 MCG/INH AEPB       Review of systems complete and found to be negative unless listed above in HPI    Physical exam Blood pressure 123/79, pulse 62, temperature (!) 97.1 F (36.2 C), height 5' 10.5" (1.791 m), weight 189 lb (85.7 kg). General: NAD Neck: No JVD, no thyromegaly or thyroid nodule.  Lungs: Clear to auscultation bilaterally with normal respiratory effort. CV: Nondisplaced PMI. Regular rate and rhythm, normal S1/S2, no S3/S4, no murmur.  No peripheral edema.  No carotid bruit.    Abdomen: Soft, nontender, no distention.  Skin: Intact without lesions or rashes.  Neurologic: Alert and oriented x 3.  Psych: Normal affect. Extremities: No clubbing or cyanosis.  HEENT: Normal.   ECG: Most recent ECG reviewed.   Labs: Lab Results  Component Value Date/Time   HGB 15.0 10/08/2011 08:28 AM     Lipids: No results found for: LDLCALC, LDLDIRECT, CHOL, TRIG, HDL      ASSESSMENT AND PLAN:  1.  Bradycardia with fatigue and exertional dyspnea: He wore an event monitor which did not show any significant  symptomatic bradycardia.  TSH is normal.  Prior nuclear stress test did not show any significant infarct or ischemia.  I will obtain coronary CT angiography. I will order a 2-D echocardiogram with Doppler to evaluate cardiac structure, function, and regional wall motion.  2.  COPD: He has chronic exertional dyspnea.  In order to rule out an underlying cardiac etiology, I will obtain a coronary CT angiogram.     Disposition: Follow up in 3 months  Signed: Kate Sable, M.D., F.A.C.C.  12/21/2018, 8:44 AM

## 2018-12-21 NOTE — Patient Instructions (Addendum)
.  Medication Instructions: Your physician recommends that you continue on your current medications as directed. Please refer to the Current Medication list given to you today.   Labwork: bmet 1 WEEK BEFORE CARDIAC CT  Procedures/Testing: Your physician has requested that you have cardiac CT. Cardiac computed tomography (CT) is a painless test that uses an x-ray machine to take clear, detailed pictures of your heart. For further information please visit [facility]. Please follow instruction sheet as given.  Your physician has requested that you have an echocardiogram. Echocardiography is a painless test that uses sound waves to create images of your heart. It provides your doctor with information about the size and shape of your heart and how well your heart's chambers and valves are working. This procedure takes approximately one hour. There are no restrictions for this procedure.    Follow-Up: 3 months with Dr.Koneswaran  Any Additional Special Instructions Will Be Listed Below (If Applicable).     If you need a refill on your cardiac medications before your next appointment, please call your pharmacy.

## 2018-12-23 ENCOUNTER — Ambulatory Visit (INDEPENDENT_AMBULATORY_CARE_PROVIDER_SITE_OTHER): Payer: BC Managed Care – PPO | Admitting: Urology

## 2018-12-23 DIAGNOSIS — R3912 Poor urinary stream: Secondary | ICD-10-CM | POA: Diagnosis not present

## 2018-12-23 DIAGNOSIS — N5201 Erectile dysfunction due to arterial insufficiency: Secondary | ICD-10-CM

## 2018-12-25 ENCOUNTER — Ambulatory Visit (HOSPITAL_COMMUNITY)
Admission: RE | Admit: 2018-12-25 | Discharge: 2018-12-25 | Disposition: A | Discharge status: Home / Self Care | Payer: BC Managed Care – PPO | Source: Ambulatory Visit | Attending: Cardiovascular Disease | Admitting: Cardiovascular Disease

## 2018-12-25 ENCOUNTER — Other Ambulatory Visit: Payer: Self-pay

## 2018-12-25 DIAGNOSIS — R0609 Other forms of dyspnea: Secondary | ICD-10-CM | POA: Insufficient documentation

## 2018-12-25 NOTE — Progress Notes (Signed)
*  PRELIMINARY RESULTS* Echocardiogram 2D Echocardiogram has been performed.  Leavy Cella 12/25/2018, 10:23 AM

## 2019-01-05 DIAGNOSIS — R001 Bradycardia, unspecified: Secondary | ICD-10-CM | POA: Diagnosis not present

## 2019-01-05 DIAGNOSIS — N401 Enlarged prostate with lower urinary tract symptoms: Secondary | ICD-10-CM | POA: Diagnosis not present

## 2019-01-05 DIAGNOSIS — Z23 Encounter for immunization: Secondary | ICD-10-CM | POA: Diagnosis not present

## 2019-01-05 DIAGNOSIS — J449 Chronic obstructive pulmonary disease, unspecified: Secondary | ICD-10-CM | POA: Diagnosis not present

## 2019-01-05 DIAGNOSIS — K210 Gastro-esophageal reflux disease with esophagitis: Secondary | ICD-10-CM | POA: Diagnosis not present

## 2019-01-14 ENCOUNTER — Other Ambulatory Visit (HOSPITAL_COMMUNITY)
Admission: RE | Admit: 2019-01-14 | Discharge: 2019-01-14 | Disposition: A | Discharge status: Home / Self Care | Payer: BC Managed Care – PPO | Source: Ambulatory Visit | Attending: Cardiovascular Disease | Admitting: Cardiovascular Disease

## 2019-01-14 ENCOUNTER — Other Ambulatory Visit: Payer: Self-pay

## 2019-01-14 DIAGNOSIS — R071 Chest pain on breathing: Secondary | ICD-10-CM | POA: Insufficient documentation

## 2019-01-14 LAB — BASIC METABOLIC PANEL
Anion gap: 8 (ref 5–15)
BUN: 9 mg/dL (ref 6–20)
CO2: 25 mmol/L (ref 22–32)
Calcium: 9 mg/dL (ref 8.9–10.3)
Chloride: 108 mmol/L (ref 98–111)
Creatinine, Ser: 1.04 mg/dL (ref 0.61–1.24)
GFR calc Af Amer: >60 mL/min (ref >60)
GFR calc non Af Amer: >60 mL/min (ref >60)
Glucose, Bld: 98 mg/dL (ref 70–99)
Potassium: 4.1 mmol/L (ref 3.5–5.1)
Sodium: 141 mmol/L (ref 135–145)

## 2019-01-18 ENCOUNTER — Telehealth (HOSPITAL_COMMUNITY): Payer: Self-pay | Admitting: Emergency Medicine

## 2019-01-18 NOTE — Telephone Encounter (Signed)
Left message on voicemail with name and callback number Haliey Romberg RN Navigator Cardiac Imaging [facility] [phone] Office [phone] Cell  

## 2019-01-20 ENCOUNTER — Encounter: Payer: BC Managed Care – PPO | Admitting: *Deleted

## 2019-01-20 ENCOUNTER — Other Ambulatory Visit: Payer: Self-pay

## 2019-01-20 ENCOUNTER — Ambulatory Visit (HOSPITAL_COMMUNITY)
Admission: RE | Admit: 2019-01-20 | Discharge: 2019-01-20 | Disposition: A | Discharge status: Home / Self Care | Payer: BC Managed Care – PPO | Source: Ambulatory Visit | Attending: Cardiovascular Disease | Admitting: Cardiovascular Disease

## 2019-01-20 DIAGNOSIS — R071 Chest pain on breathing: Secondary | ICD-10-CM

## 2019-01-20 DIAGNOSIS — Z006 Encounter for examination for normal comparison and control in clinical research program: Secondary | ICD-10-CM

## 2019-01-20 MED ORDER — NITROGLYCERIN 0.4 MG SL SUBL
SUBLINGUAL_TABLET | SUBLINGUAL | Status: AC
  Filled 2019-01-20: qty 2

## 2019-01-20 MED ORDER — IOHEXOL 350 MG/ML SOLN
80.0000 mL | Freq: Once | INTRAVENOUS | Status: AC | PRN
  Administered 2019-01-20: 09:00:00 80 mL via INTRAVENOUS

## 2019-01-20 MED ORDER — NITROGLYCERIN 0.4 MG SL SUBL
0.8000 mg | SUBLINGUAL_TABLET | Freq: Once | SUBLINGUAL | Status: AC
  Administered 2019-01-20: 09:00:00 0.8 mg via SUBLINGUAL
  Filled 2019-01-20: qty 25

## 2019-01-20 NOTE — Research (Signed)
Subject Name: Peter Mahoney  Subject met inclusion and exclusion criteria.  The informed consent form, study requirements and expectations were reviewed with the subject and questions and concerns were addressed prior to the signing of the consent form.  The subject verbalized understanding of the trial requirements.  The subject agreed to participate in the CADFEM trial and signed the informed consent at 0730 on09/30/20.  The informed consent was obtained prior to performance of any protocol-specific procedures for the subject.  A copy of the signed informed consent was given to the subject and a copy was placed in the subject's medical record.   Peter Mahoney

## 2019-02-16 DIAGNOSIS — N401 Enlarged prostate with lower urinary tract symptoms: Secondary | ICD-10-CM | POA: Diagnosis not present

## 2019-02-16 DIAGNOSIS — J449 Chronic obstructive pulmonary disease, unspecified: Secondary | ICD-10-CM | POA: Diagnosis not present

## 2019-02-16 DIAGNOSIS — R001 Bradycardia, unspecified: Secondary | ICD-10-CM | POA: Diagnosis not present

## 2019-02-16 DIAGNOSIS — J301 Allergic rhinitis due to pollen: Secondary | ICD-10-CM | POA: Diagnosis not present

## 2019-03-26 ENCOUNTER — Encounter: Payer: Self-pay | Admitting: Cardiovascular Disease

## 2019-03-26 ENCOUNTER — Telehealth (INDEPENDENT_AMBULATORY_CARE_PROVIDER_SITE_OTHER): Payer: BC Managed Care – PPO | Admitting: Cardiovascular Disease

## 2019-03-26 VITALS — BP 120/63 | HR 51 | Ht 70.0 in | Wt 185.0 lb

## 2019-03-26 DIAGNOSIS — J449 Chronic obstructive pulmonary disease, unspecified: Secondary | ICD-10-CM | POA: Diagnosis not present

## 2019-03-26 DIAGNOSIS — R5383 Other fatigue: Secondary | ICD-10-CM

## 2019-03-26 DIAGNOSIS — R001 Bradycardia, unspecified: Secondary | ICD-10-CM | POA: Diagnosis not present

## 2019-03-26 DIAGNOSIS — J439 Emphysema, unspecified: Secondary | ICD-10-CM

## 2019-03-26 DIAGNOSIS — R42 Dizziness and giddiness: Secondary | ICD-10-CM

## 2019-03-26 NOTE — Progress Notes (Signed)
Virtual Visit via Telephone Note   This visit type was conducted due to national recommendations for restrictions regarding the COVID-19 Pandemic (e.g. social distancing) in an effort to limit this patient's exposure and mitigate transmission in our community.  Due to his co-morbid illnesses, this patient is at least at moderate risk for complications without adequate follow up.  This format is felt to be most appropriate for this patient at this time.  The patient did not have access to video technology/had technical difficulties with video requiring transitioning to audio format only (telephone).  All issues noted in this document were discussed and addressed.  No physical exam could be performed with this format.  Please refer to the patient's chart for his  consent to telehealth for [facility].   Date:  03/26/2019   ID:  Peter Mahoney, DOB Aug 17, 1960, MRN [id number]  Patient Location: Home Provider Location: Home  PCP:  [name], MD  Cardiologist:  No primary care provider on file.  Electrophysiologist:  None   Evaluation Performed:  Follow-Up Visit  Chief Complaint: Bradycardia  History of Present Illness:    Peter Mahoney is a 58 y.o. male with bradycardia.  He also has a history of COPD from a prior history of smoking.  He wore an event monitor performed in July 2020 which demonstrated normal sinus rhythm with 1 asymptomatic run of a wide complex tachycardia lasting 4 beats.  Symptoms of fatigue and dyspnea did not correlate with any arrhythmia.  Coronary CT angiography on 01/20/2019 showed mixed plaque with no significant obstructive coronary artery disease.  Coronary artery calcium score was 10.5 units.  Echocardiogram on 12/25/2018 demonstrated normal left ventricular systolic function, LVEF 60 to 65%.  He has occasional lightheadedness and when he checks his HR is in high 40/low 50 bpm range. Symptoms can occur sporadically throughout the day. BP's have been stable with  readings in the 110-120/60 range.   Social history: His wife, [name], is also my patient.   Past Medical History:  Diagnosis Date  . Dental crown present   . GERD (gastroesophageal reflux disease)   . Post-operative infection 09/2011   left leg  . Seasonal allergies   . Wound infection after surgery, left lateral leg 10/08/2011   Past Surgical History:  Procedure Laterality Date  . BACK SURGERY  04/1995   L5-S1  . COLONOSCOPY N/A 05/12/2015   Procedure: COLONOSCOPY;  Surgeon: Aviva Signs, MD;  [facility];  Service: Gastroenterology;  Laterality: N/A;  . INCISION AND DRAINAGE OF WOUND  10/08/2011   Procedure: IRRIGATION AND DEBRIDEMENT WOUND;  Surgeon: Johnny Bridge, MD;  Location: [facility];  Service: Orthopedics;  Laterality: Left;  left left post op wound  . KNEE ARTHROSCOPY  11/2010   left knee - meniscus tear  . KNEE SURGERY  08/26/2011   hamstring tendon tear left  . NM MYOVIEW LTD  11/17/2012   Exercise 7 minutes. EF 60%. No ischemia or infarction.; Low risk  . TONSILLECTOMY     as a child  . TRANSTHORACIC ECHOCARDIOGRAM  01/2016   Normal LV function EF 5560%. No regional wall motion abnormality. 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 Mild LA dilation. Otherwise normal     Current Meds  Medication Sig  . diazepam (VALIUM) 10 MG tablet   . RABEprazole (ACIPHEX) 20 MG tablet Take 20 mg by mouth daily.  . sildenafil (VIAGRA) 100 MG tablet Take  1 tablet (100 mg total) by mouth daily as needed for erectile dysfunction.  . tamsulosin (FLOMAX) 0.4 MG CAPS capsule Take 0.4 mg by mouth.  . [DISCONTINUED] TRELEGY ELLIPTA 100-62.5-25 MCG/INH AEPB      Allergies:   Dilaudid [hydromorphone hcl], Doxycycline, and Adhesive [tape]   Social History   Tobacco Use  . Smoking status: Former Smoker    Packs/day: 1.00    Years: 35.00    Pack years: 35.00    Types: Cigarettes    Quit date: 11/01/2015     Years since quitting: 3.4  . Smokeless tobacco: Never Used  Substance Use Topics  . Alcohol use: Yes    Alcohol/week: 2.0 standard drinks    Types: 2 Standard drinks or equivalent per week    Comment: occasionally  . Drug use: No     Family Hx: The patient's family history includes Cancer in his paternal grandfather; Cancer - Lung in his father; Diabetes in his paternal grandmother; Emphysema in his maternal grandfather; Heart Problems in his mother; Heart failure in his maternal grandmother; Pulmonary fibrosis in his mother; Stroke in his mother.  ROS:   Please see the history of present illness.     All other systems reviewed and are negative.   Prior CV studies:   The following studies were reviewed today:  Reviewed above  Labs/Other Tests and Data Reviewed:    EKG:  No ECG reviewed.  Recent Labs: 01/14/2019: BUN 9; Creatinine, Ser 1.04; Potassium 4.1; Sodium 141   Recent Lipid Panel No results found for: CHOL, TRIG, HDL, CHOLHDL, LDLCALC, LDLDIRECT  Wt Readings from Last 3 Encounters:  03/26/19 185 lb (83.9 kg)  12/21/18 189 lb (85.7 kg)  07/30/16 189 lb 9.6 oz (86 kg)     Objective:    Vital Signs:  BP 120/63   Pulse (!) 51   Ht 5\' 10"  (1.778 m)   Wt 185 lb (83.9 kg)   BMI 26.54 kg/m    VITAL SIGNS:  reviewed  ASSESSMENT & PLAN:    1.  Bradycardia with fatigue, dizziness and exertional dyspnea: As stated above, he previously wore an event monitor which did not demonstrate any symptomatic bradycardia.  TSH is normal.  Coronary CT angiography reviewed above showed no obstructive coronary artery disease.  Echocardiogram demonstrated normal LV systolic function.  Heart rate is 51 bpm today.  This will need further monitoring over time. No indication for a pacemaker.  2.  COPD: He has chronic exertional dyspnea due to a prior history of smoking.  This is stable.    COVID-19 Education: The signs and symptoms of COVID-19 were discussed with the patient and how  to seek care for testing (follow up with PCP or arrange E-visit).  The importance of social distancing was discussed today.  Time:   Today, I have spent 15 minutes with the patient with telehealth technology discussing the above problems.     Medication Adjustments/Labs and Tests Ordered: Current medicines are reviewed at length with the patient today.  Concerns regarding medicines are outlined above.   Tests Ordered: No orders of the defined types were placed in this encounter.   Medication Changes: No orders of the defined types were placed in this encounter.   Follow Up:  Virtual Visit  in 1 year(s)  Signed, Kate Sable, MD  03/26/2019 8:15 AM    [place]

## 2019-03-26 NOTE — Patient Instructions (Signed)
Medication Instructions:  Your physician recommends that you continue on your current medications as directed. Please refer to the Current Medication list given to you today.  If you need a refill on your cardiac medications before your next appointment, please call your pharmacy  Lab Work: None today If you have labs (blood work) drawn today and your tests are completely normal, you will receive your results only by: Marland Kitchen MyChart Message (if you have MyChart) OR . A paper copy in the mail If you have any lab test that is abnormal or we need to change your treatment, we will call you to review the results.  Testing/Procedures: None today  Follow-Up: At [facility], you and your health needs are our priority.  As part of our continuing mission to provide you with exceptional heart care, we have created designated Provider Care Teams.  These Care Teams include your primary Cardiologist (physician) and Advanced Practice Providers (APPs -  Physician Assistants and Nurse Practitioners) who all work together to provide you with the care you need, when you need it.  Your next appointment:   12 month(s)  The format for your next appointment:   In Person  Provider:   You may see  or one of the following Advanced Practice Providers on your designated Care Team:    Bernerd Pho, PA-C   Ermalinda Barrios, PA-C       Thank you for choosing [facility] !

## 2019-03-30 DIAGNOSIS — J449 Chronic obstructive pulmonary disease, unspecified: Secondary | ICD-10-CM | POA: Diagnosis not present

## 2019-03-30 DIAGNOSIS — N401 Enlarged prostate with lower urinary tract symptoms: Secondary | ICD-10-CM | POA: Diagnosis not present

## 2019-03-30 DIAGNOSIS — R001 Bradycardia, unspecified: Secondary | ICD-10-CM | POA: Diagnosis not present

## 2019-05-03 ENCOUNTER — Encounter (HOSPITAL_COMMUNITY): Payer: BC Managed Care – PPO

## 2019-05-06 DIAGNOSIS — G4700 Insomnia, unspecified: Secondary | ICD-10-CM | POA: Diagnosis not present

## 2019-05-06 DIAGNOSIS — Z6825 Body mass index (BMI) 25.0-25.9, adult: Secondary | ICD-10-CM | POA: Diagnosis not present

## 2019-05-06 DIAGNOSIS — J449 Chronic obstructive pulmonary disease, unspecified: Secondary | ICD-10-CM | POA: Diagnosis not present

## 2019-05-06 DIAGNOSIS — K219 Gastro-esophageal reflux disease without esophagitis: Secondary | ICD-10-CM | POA: Diagnosis not present

## 2019-05-06 DIAGNOSIS — Z1389 Encounter for screening for other disorder: Secondary | ICD-10-CM | POA: Diagnosis not present

## 2019-05-06 DIAGNOSIS — Z13220 Encounter for screening for lipoid disorders: Secondary | ICD-10-CM | POA: Diagnosis not present

## 2019-05-28 ENCOUNTER — Other Ambulatory Visit: Payer: Self-pay

## 2019-05-28 ENCOUNTER — Encounter (HOSPITAL_COMMUNITY)
Admission: RE | Admit: 2019-05-28 | Discharge: 2019-05-28 | Disposition: A | Discharge status: Home / Self Care | Payer: BC Managed Care – PPO | Source: Ambulatory Visit | Attending: Emergency Medicine | Admitting: Emergency Medicine

## 2019-05-28 VITALS — BP 120/82 | HR 66 | Ht 70.0 in | Wt 188.7 lb

## 2019-05-28 DIAGNOSIS — J439 Emphysema, unspecified: Secondary | ICD-10-CM | POA: Insufficient documentation

## 2019-05-28 NOTE — Progress Notes (Signed)
Pulmonary Individual Treatment Plan  Patient Details  Name: Peter Mahoney MRN: [id number] Date of Birth: Jun 30, 1960 Referring Provider:     PULMONARY REHAB OTHER RESP ORIENTATION from 05/28/2019 in [facility]  Referring Provider  Dr. Lamonte Sakai      Initial Encounter Date:    [place] from 05/28/2019 in [facility]  Date  05/28/19      Visit Diagnosis: Pulmonary emphysema, unspecified emphysema type ([place])  Patient's Home Medications on Admission:   Current Outpatient Medications:  .  ascorbic acid (VITAMIN C) 1000 MG tablet, Take 500 mg by mouth daily., Disp: , Rfl:  .  cholecalciferol (VITAMIN D3) 25 MCG (1000 UNIT) tablet, Take 2,000 Units by mouth daily., Disp: , Rfl:  .  cyanocobalamin 1000 MCG tablet, Take 5,000 mcg by mouth daily. , Disp: , Rfl:  .  zinc gluconate 50 MG tablet, Take 50 mg by mouth daily., Disp: , Rfl:  .  diazepam (VALIUM) 10 MG tablet, 10 mg at bedtime. , Disp: , Rfl:  .  RABEprazole (ACIPHEX) 20 MG tablet, Take 20 mg by mouth daily., Disp: , Rfl:  .  sildenafil (VIAGRA) 100 MG tablet, Take 1 tablet (100 mg total) by mouth daily as needed for erectile dysfunction., Disp: 10 tablet, Rfl: 6 .  tamsulosin (FLOMAX) 0.4 MG CAPS capsule, Take 0.4 mg by mouth at bedtime. , Disp: , Rfl:   Past Medical History: Past Medical History:  Diagnosis Date  . Dental crown present   . GERD (gastroesophageal reflux disease)   . Post-operative infection 09/2011   left leg  . Seasonal allergies   . Wound infection after surgery, left lateral leg 10/08/2011    Tobacco Use: Social History   Tobacco Use  Smoking Status Former Smoker  . Packs/day: 1.00  . Years: 35.00  . Pack years: 35.00  . Types: Cigarettes  . Quit date: 11/01/2015  . Years since quitting: 3.5  Smokeless Tobacco Never Used    Labs: Recent Review Flowsheet Data    There is no flowsheet data to display.      Capillary Blood  Glucose: No results found for: GLUCAP   Pulmonary Assessment Scores: Pulmonary Assessment Scores    Row Name 05/28/19 1152         ADL UCSD   ADL Phase  Entry     SOB Score total  105     Rest  4     Walk  14     Stairs  5     Bath  5     Dress  4     Shop  4       CAT Score   CAT Score  31       mMRC Score   mMRC Score  5       UCSD: Self-administered rating of dyspnea associated with activities of daily living (ADLs) 6-point scale (0 = "not at all" to 5 = "maximal or unable to do because of breathlessness")  Scoring Scores range from 0 to 120.  Minimally important difference is 5 units  CAT: CAT can identify the health impairment of COPD patients and is better correlated with disease progression.  CAT has a scoring range of zero to 40. The CAT score is classified into four groups of low (less than 10), medium (10 - 20), high (21-30) and very high (31-40) based on the impact level of disease on health status. A CAT score over  10 suggests significant symptoms.  A worsening CAT score could be explained by an exacerbation, poor medication adherence, poor inhaler technique, or progression of COPD or comorbid conditions.  CAT MCID is 2 points  mMRC: mMRC (Modified Medical Research Council) Dyspnea Scale is used to assess the degree of baseline functional disability in patients of respiratory disease due to dyspnea. No minimal important difference is established. A decrease in score of 1 point or greater is considered a positive change.   Pulmonary Function Assessment: Pulmonary Function Assessment - 05/28/19 1151      Pulmonary Function Tests   FVC%  92 %    FEV1%  91 %    FEV1/FVC Ratio  99    DLCO%  48 %      Initial Spirometry Results   FVC%  92 %    FEV1%  91 %    FEV1/FVC Ratio  99      Post Bronchodilator Spirometry Results   FVC%  91 %    FEV1%  88 %    FEV1/FVC Ratio  97      Breath   Bilateral Breath Sounds  Clear    Shortness of Breath  Yes        Exercise Target Goals: Exercise Program Goal: Individual exercise prescription set using results from initial 6 min walk test and THRR while considering  patient's activity barriers and safety.   Exercise Prescription Goal: Initial exercise prescription builds to 30-45 minutes a day of aerobic activity, 2-3 days per week.  Home exercise guidelines will be given to patient during program as part of exercise prescription that the participant will acknowledge.  Activity Barriers & Risk Stratification: Activity Barriers & Cardiac Risk Stratification - 05/28/19 1124      Activity Barriers & Cardiac Risk Stratification   Activity Barriers  Shortness of Breath;Other (comment)   knee surgeryx4, plantar fascitis (L) foot   Cardiac Risk Stratification  Low       6 Minute Walk: 6 Minute Walk    Row Name 05/28/19 1121         6 Minute Walk   Phase  Initial     Distance  1400 feet     Walk Time  6 minutes     # of Rest Breaks  0     MPH  2.65     METS  3.03     RPE  14     Perceived Dyspnea   16     VO2 Peak  13.49     Symptoms  Yes (comment)     Comments  SOB     Resting HR  66 bpm     Resting BP  120/80     Resting Oxygen Saturation   97 %     Exercise Oxygen Saturation  during 6 min walk  94 %     Max Ex. HR  99 bpm     Max Ex. BP  124/84     2 Minute Post BP  120/80        Oxygen Initial Assessment: Oxygen Initial Assessment - 05/28/19 1149      Home Oxygen   Home Oxygen Device  None    Sleep Oxygen Prescription  None    Home Exercise Oxygen Prescription  None    Home at Rest Exercise Oxygen Prescription  None      Initial 6 min Walk   Oxygen Used  None      Program Oxygen Prescription  Program Oxygen Prescription  None      Intervention   Short Term Goals  To learn and understand importance of monitoring SPO2 with pulse oximeter and demonstrate accurate use of the pulse oximeter.;To learn and understand importance of maintaining oxygen saturations>88%;To  learn and demonstrate proper pursed lip breathing techniques or other breathing techniques.    Long  Term Goals  Verbalizes importance of monitoring SPO2 with pulse oximeter and return demonstration;Maintenance of O2 saturations>88%;Exhibits proper breathing techniques, such as pursed lip breathing or other method taught during program session       Oxygen Re-Evaluation:   Oxygen Discharge (Final Oxygen Re-Evaluation):   Initial Exercise Prescription: Initial Exercise Prescription - 05/28/19 1100      Date of Initial Exercise RX and Referring Provider   Date  05/28/19    Referring Provider  Dr. Lamonte Sakai    Expected Discharge Date  08/25/19      Treadmill   MPH  1.3    Grade  0    Minutes  17    METs  1.99      Recumbant Elliptical   Level  1    RPM  45    Watts  64    Minutes  20    METs  3.5      Prescription Details   Frequency (times per week)  2    Duration  Progress to 30 minutes of continuous aerobic without signs/symptoms of physical distress      Intensity   THRR 40-80% of Max Heartrate  [phone]    Ratings of Perceived Exertion  11-13    Perceived Dyspnea  0-4      Progression   Progression  Continue to progress workloads to maintain intensity without signs/symptoms of physical distress.      Resistance Training   Training Prescription  Yes    Weight  1    Reps  10-15       Perform Capillary Blood Glucose checks as needed.  Exercise Prescription Changes:   Exercise Comments:   Exercise Goals and Review:  Exercise Goals    Row Name 05/28/19 1141             Exercise Goals   Increase Physical Activity  Yes       Intervention  Provide advice, education, support and counseling about physical activity/exercise needs.;Develop an individualized exercise prescription for aerobic and resistive training based on initial evaluation findings, risk stratification, comorbidities and participant's personal goals.       Expected Outcomes  Short Term:  Attend rehab on a regular basis to increase amount of physical activity.;Long Term: Add in home exercise to make exercise part of routine and to increase amount of physical activity.       Increase Strength and Stamina  Yes       Intervention  Provide advice, education, support and counseling about physical activity/exercise needs.;Develop an individualized exercise prescription for aerobic and resistive training based on initial evaluation findings, risk stratification, comorbidities and participant's personal goals.       Expected Outcomes  Short Term: Perform resistance training exercises routinely during rehab and add in resistance training at home;Long Term: Improve cardiorespiratory fitness, muscular endurance and strength as measured by increased METs and functional capacity (6MWT)       Able to understand and use rate of perceived exertion (RPE) scale  Yes       Intervention  Provide education and explanation on how to use RPE scale  Expected Outcomes  Short Term: Able to use RPE daily in rehab to express subjective intensity level;Long Term:  Able to use RPE to guide intensity level when exercising independently       Able to understand and use Dyspnea scale  Yes       Intervention  Provide education and explanation on how to use Dyspnea scale       Expected Outcomes  Short Term: Able to use Dyspnea scale daily in rehab to express subjective sense of shortness of breath during exertion;Long Term: Able to use Dyspnea scale to guide intensity level when exercising independently       Knowledge and understanding of Target Heart Rate Range (THRR)  Yes       Intervention  Provide education and explanation of THRR including how the numbers were predicted and where they are located for reference       Expected Outcomes  Short Term: Able to use daily as guideline for intensity in rehab;Long Term: Able to use THRR to govern intensity when exercising independently       Able to check pulse  independently  Yes       Intervention  Provide education and demonstration on how to check pulse in carotid and radial arteries.;Review the importance of being able to check your own pulse for safety during independent exercise       Expected Outcomes  Short Term: Able to explain why pulse checking is important during independent exercise;Long Term: Able to check pulse independently and accurately       Understanding of Exercise Prescription  Yes       Intervention  Provide education, explanation, and written materials on patient's individual exercise prescription       Expected Outcomes  Short Term: Able to explain program exercise prescription;Long Term: Able to explain home exercise prescription to exercise independently          Exercise Goals Re-Evaluation :   Discharge Exercise Prescription (Final Exercise Prescription Changes):   Nutrition:  Target Goals: Understanding of nutrition guidelines, daily intake of sodium 1500mg , cholesterol 200mg , calories 30% from fat and 7% or less from saturated fats, daily to have 5 or more servings of fruits and vegetables.  Biometrics: Pre Biometrics - 05/28/19 1144      Pre Biometrics   Height  5\' 10"  (1.778 m)    Weight  188 lb 11.2 oz (85.6 kg)    Waist Circumference  38 inches    Hip Circumference  36 inches    Waist to Hip Ratio  1.06 %    BMI (Calculated)  27.08    Triceps Skinfold  8 mm    % Body Fat  23.2 %    Grip Strength  31.6 kg    Flexibility  0 in    Single Leg Stand  60 seconds        Nutrition Therapy Plan and Nutrition Goals: Nutrition Therapy & Goals - 05/28/19 1157      Nutrition Therapy   RD appointment deferred  Yes      Personal Nutrition Goals   Personal Goal #2  Patient is working on eating a more heart healthy diet. Handout given on how to chose heart healthy food choices.      Intervention Plan   Intervention  Nutrition handout(s) given to patient.       Nutrition Assessments: Nutrition  Assessments - 05/28/19 1158      MEDFICTS Scores   Pre Score  66  Nutrition Goals Re-Evaluation:   Nutrition Goals Discharge (Final Nutrition Goals Re-Evaluation):   Psychosocial: Target Goals: Acknowledge presence or absence of significant depression and/or stress, maximize coping skills, provide positive support system. Participant is able to verbalize types and ability to use techniques and skills needed for reducing stress and depression.  Initial Review & Psychosocial Screening: Initial Psych Review & Screening - 05/28/19 1154      Initial Review   Current issues with  None Identified      Family Dynamics   Good Support System?  Yes    Concerns  No support system      Barriers   Psychosocial barriers to participate in program  There are no identifiable barriers or psychosocial needs.      Screening Interventions   Interventions  Encouraged to exercise    Expected Outcomes  Short Term goal: Identification and review with participant of any Quality of Life or Depression concerns found by scoring the questionnaire.;Long Term goal: The participant improves quality of Life and PHQ9 Scores as seen by post scores and/or verbalization of changes       Quality of Life Scores: Quality of Life - 05/28/19 1155      Quality of Life Scores   Health/Function Pre  12.38 %    Socioeconomic Pre  30 %    Psych/Spiritual Pre  27.43 %    Family Pre  24 %    GLOBAL Pre  20.29 %      Scores of 19 and below usually indicate a poorer quality of life in these areas.  A difference of  2-3 points is a clinically meaningful difference.  A difference of 2-3 points in the total score of the Quality of Life Index has been associated with significant improvement in overall quality of life, self-image, physical symptoms, and general health in studies assessing change in quality of life.   PHQ-9: Recent Review Flowsheet Data    Depression screen [facility] 2/9 05/28/2019   Decreased Interest 0    Down, Depressed, Hopeless 0   PHQ - 2 Score 0   Altered sleeping 3   Tired, decreased energy 2   Change in appetite 0   Feeling bad or failure about yourself  0   Trouble concentrating 0   Moving slowly or fidgety/restless 0   Suicidal thoughts 0   PHQ-9 Score 5   Difficult doing work/chores Very difficult     Interpretation of Total Score  Total Score Depression Severity:  1-4 = Minimal depression, 5-9 = Mild depression, 10-14 = Moderate depression, 15-19 = Moderately severe depression, 20-27 = Severe depression   Psychosocial Evaluation and Intervention: Psychosocial Evaluation - 05/28/19 1155      Psychosocial Evaluation & Interventions   Interventions  Encouraged to exercise with the program and follow exercise prescription    Continue Psychosocial Services   No Follow up required       Psychosocial Re-Evaluation:   Psychosocial Discharge (Final Psychosocial Re-Evaluation):    Education: Education Goals: Education classes will be provided on a weekly basis, covering required topics. Participant will state understanding/return demonstration of topics presented.  Learning Barriers/Preferences: Learning Barriers/Preferences - 05/28/19 1159      Learning Barriers/Preferences   Learning Barriers  None    Learning Preferences  Group Instruction;Skilled Demonstration       Education Topics: How Lungs Work and Diseases: - Discuss the anatomy of the lungs and diseases that can affect the lungs, such as COPD.   Exercise: -Discuss  the importance of exercise, FITT principles of exercise, normal and abnormal responses to exercise, and how to exercise safely.   Environmental Irritants: -Discuss types of environmental irritants and how to limit exposure to environmental irritants.   Meds/Inhalers and oxygen: - Discuss respiratory medications, definition of an inhaler and oxygen, and the proper way to use an inhaler and oxygen.   Energy Saving Techniques: - Discuss  methods to conserve energy and decrease shortness of breath when performing activities of daily living.    Bronchial Hygiene / Breathing Techniques: - Discuss breathing mechanics, pursed-lip breathing technique,  proper posture, effective ways to clear airways, and other functional breathing techniques   Cleaning Equipment: - Provides group verbal and written instruction about the health risks of elevated stress, cause of high stress, and healthy ways to reduce stress.   Nutrition I: Fats: - Discuss the types of cholesterol, what cholesterol does to the body, and how cholesterol levels can be controlled.   Nutrition II: Labels: -Discuss the different components of food labels and how to read food labels.   Respiratory Infections: - Discuss the signs and symptoms of respiratory infections, ways to prevent respiratory infections, and the importance of seeking medical treatment when having a respiratory infection.   Stress I: Signs and Symptoms: - Discuss the causes of stress, how stress may lead to anxiety and depression, and ways to limit stress.   Stress II: Relaxation: -Discuss relaxation techniques to limit stress.   Oxygen for Home/Travel: - Discuss how to prepare for travel when on oxygen and proper ways to transport and store oxygen to ensure safety.   Knowledge Questionnaire Score: Knowledge Questionnaire Score - 05/28/19 1204      Knowledge Questionnaire Score   Pre Score  16/18       Core Components/Risk Factors/Patient Goals at Admission: Personal Goals and Risk Factors at Admission - 05/28/19 1204      Core Components/Risk Factors/Patient Goals on Admission    Weight Management  Weight Maintenance    Personal Goal Other  Yes    Personal Goal  Help with SOB, Be more active    Intervention  Attend PR 2 x week and supplement with 3 x week of TM waling and stationary bike riding at home.    Expected Outcomes  Reach goals       Core Components/Risk  Factors/Patient Goals Review:    Core Components/Risk Factors/Patient Goals at Discharge (Final Review):    ITP Comments: ITP Comments    Row Name 05/28/19 1117           ITP Comments  Patient is eager to get started.          Comments: Patient arrived for 1st visit/orientation/education at 0800.  Patient was referred to PR by Dr. Sinda Du due to Emphysema (J43.9). During orientation advised patient on arrival and appointment times what to wear, what to do before, during and after exercise. Reviewed attendance and class policy. Talked about inclement weather and class consultation policy. Pt is scheduled to return Pulmonary Rehab on 06/01/2019 at 1045. Pt was advised to come to class 15 minutes before class starts. Patient was also given instructions on meeting with the dietician and attending the Family Structure classes. Discussed RPE/Dpysnea scales. Discussed initial THR and how to find their radial and/or carotid pulse. Discussed the initial exercise prescription and how this effects their progress. Pt is eager to get started. Patient participated in warm up stretches followed by light weights and resistance bands. Patient was  able to complete 6 minute walk test. Patient did not c/o pain. Patient was measured for the equipment. Discussed equipment safety with patient. Took patient pre-anthropometric measurements. Patient finished visit at 10:00.

## 2019-05-28 NOTE — Progress Notes (Signed)
Cardiac/Pulmonary Rehab Medication Review by a Pharmacist  Does the patient  feel that his/her medications are working for him/her?  yes  Has the patient been experiencing any side effects to the medications prescribed?  no  Does the patient measure his/her own blood pressure or blood glucose at home?  yes - blood pressure runs around 120s  Does the patient have any problems obtaining medications due to transportation or finances?   no  Understanding of regimen: good Understanding of indications: good Potential of compliance: good  Questions asked to Determine Patient Understanding of Medication Regimen:  1. What is the name of the medication?  2. What is the medication used for?  3. When should it be taken?  4. How much should be taken?  5. How will you take it?  6. What side effects should you report?  Understanding Defined as: Excellent: All questions above are correct Good: Questions 1-4 are correct Fair: Questions 1-2 are correct  Poor: 1 or none of the above questions are correct   Pharmacist comments: Overall, patient has good understanding of medications and high compliance- started vitamins recently during [place].     [name] 05/28/2019 8:41 AM

## 2019-05-28 NOTE — Progress Notes (Signed)
Daily Session Note  Patient Details  Name: Peter Mahoney MRN: [id number] Date of Birth: 09/16/1960 Referring Provider:     PULMONARY REHAB OTHER RESP ORIENTATION from 05/28/2019 in [facility]  Referring Provider  Dr. Lamonte Sakai      Encounter Date: 05/28/2019  Check In: Session Check In - 05/28/19 1057      Check-In   Supervising physician immediately available to respond to emergencies  See telemetry face sheet for immediately available MD    Location  [facility]    Staff Present  Russella Dar, MS, EP, [facility], Exercise Physiologist;Debra Wynetta Emery, RN, BSN    Virtual Visit  No    Medication changes reported      No    Fall or balance concerns reported     No    Tobacco Cessation  No Change    Warm-up and Cool-down  Performed as group-led instruction    Resistance Training Performed  Yes    VAD Patient?  No    PAD/SET Patient?  No      Pain Assessment   Currently in Pain?  No/denies    Pain Score  0-No pain    Multiple Pain Sites  No       Capillary Blood Glucose: No results found for this or any previous visit (from the past 24 hour(s)).    Social History   Tobacco Use  Smoking Status Former Smoker  . Packs/day: 1.00  . Years: 35.00  . Pack years: 35.00  . Types: Cigarettes  . Quit date: 11/01/2015  . Years since quitting: 3.5  Smokeless Tobacco Never Used    Goals Met:  Proper associated with RPD/PD & O2 Sat Independence with exercise equipment Improved SOB with ADL's Exercise tolerated well Personal goals reviewed Queuing for purse lip breathing No report of cardiac concerns or symptoms Strength training completed today  Goals Unmet:  Not Applicable  Comments: Check out: 10:00   Dr. Kate Sable is Medical Director for [facility] and Pulmonary Rehab.

## 2019-06-01 ENCOUNTER — Other Ambulatory Visit: Payer: Self-pay

## 2019-06-01 ENCOUNTER — Encounter (HOSPITAL_COMMUNITY)
Admission: RE | Admit: 2019-06-01 | Discharge: 2019-06-01 | Disposition: A | Discharge status: Home / Self Care | Payer: BC Managed Care – PPO | Source: Ambulatory Visit | Attending: Emergency Medicine | Admitting: Emergency Medicine

## 2019-06-01 DIAGNOSIS — J439 Emphysema, unspecified: Secondary | ICD-10-CM

## 2019-06-01 NOTE — Progress Notes (Signed)
Daily Session Note  Patient Details  Name: Peter Mahoney MRN: [id number] Date of Birth: 05-21-1960 Referring Provider:     PULMONARY REHAB OTHER RESP ORIENTATION from 05/28/2019 in [facility]  Referring Provider  Dr. Lamonte Sakai      Encounter Date: 06/01/2019  Check In: Session Check In - 06/01/19 1100      Check-In   Supervising physician immediately available to respond to emergencies  See telemetry face sheet for immediately available MD    Location  [facility]    Staff Present  Russella Dar, MS, EP, [facility], Exercise Physiologist;Other    Virtual Visit  No    Medication changes reported      No    Fall or balance concerns reported     No    Tobacco Cessation  No Change    Warm-up and Cool-down  Performed as group-led instruction    Resistance Training Performed  Yes    VAD Patient?  No    PAD/SET Patient?  No      Pain Assessment   Currently in Pain?  No/denies    Pain Score  0-No pain    Multiple Pain Sites  No       Capillary Blood Glucose: No results found for this or any previous visit (from the past 24 hour(s)).    Social History   Tobacco Use  Smoking Status Former Smoker  . Packs/day: 1.00  . Years: 35.00  . Pack years: 35.00  . Types: Cigarettes  . Quit date: 11/01/2015  . Years since quitting: 3.5  Smokeless Tobacco Never Used    Goals Met:  Proper associated with RPD/PD & O2 Sat Independence with exercise equipment Improved SOB with ADL's Using PLB without cueing & demonstrates good technique Exercise tolerated well Personal goals reviewed No report of cardiac concerns or symptoms Strength training completed today  Goals Unmet:  Not Applicable  Comments: check out 12:00   Dr. Kate Sable is Medical Director for [place] and Pulmonary Rehab.

## 2019-06-03 ENCOUNTER — Other Ambulatory Visit: Payer: Self-pay

## 2019-06-03 ENCOUNTER — Encounter (HOSPITAL_COMMUNITY)
Admission: RE | Admit: 2019-06-03 | Discharge: 2019-06-03 | Disposition: A | Discharge status: Home / Self Care | Payer: BC Managed Care – PPO | Source: Ambulatory Visit | Attending: Emergency Medicine | Admitting: Emergency Medicine

## 2019-06-03 DIAGNOSIS — J439 Emphysema, unspecified: Secondary | ICD-10-CM | POA: Diagnosis not present

## 2019-06-03 NOTE — Progress Notes (Signed)
Daily Session Note  Patient Details  Name: Peter Mahoney MRN: [id number] Date of Birth: [birth year] Referring Provider:     PULMONARY REHAB OTHER RESP ORIENTATION from 05/28/2019 in [place]  Referring Provider  Dr. Lamonte Sakai      Encounter Date: 06/03/2019  Check In: Session Check In - 06/03/19 1100      Check-In   Supervising physician immediately available to respond to emergencies  See telemetry face sheet for immediately available MD    Location  [facility]    Staff Present  Russella Dar, MS, EP, [facility], Exercise Physiologist;Debra Wynetta Emery, RN, BSN;Other    Virtual Visit  No    Medication changes reported      No    Fall or balance concerns reported     No    Tobacco Cessation  No Change    Warm-up and Cool-down  Performed as group-led instruction    Resistance Training Performed  Yes    VAD Patient?  No    PAD/SET Patient?  No      Pain Assessment   Currently in Pain?  No/denies    Pain Score  0-No pain    Multiple Pain Sites  No       Capillary Blood Glucose: No results found for this or any previous visit (from the past 24 hour(s)).    Social History   Tobacco Use  Smoking Status Former Smoker  . Packs/day: 1.00  . Years: 35.00  . Pack years: 35.00  . Types: Cigarettes  . Quit date: 11/01/2015  . Years since quitting: 3.5  Smokeless Tobacco Never Used    Goals Met:  Independence with exercise equipment Exercise tolerated well Personal goals reviewed No report of cardiac concerns or symptoms Strength training completed today  Goals Unmet:  Not Applicable  Comments: check out 12:00   Dr. Kate Sable is Medical Director for [place] and Pulmonary Rehab.

## 2019-06-08 ENCOUNTER — Other Ambulatory Visit: Payer: Self-pay

## 2019-06-08 ENCOUNTER — Encounter (HOSPITAL_COMMUNITY): Payer: BC Managed Care – PPO

## 2019-06-08 ENCOUNTER — Encounter (HOSPITAL_COMMUNITY)
Admission: RE | Admit: 2019-06-08 | Discharge: 2019-06-08 | Disposition: A | Discharge status: Home / Self Care | Payer: BC Managed Care – PPO | Source: Ambulatory Visit | Attending: Emergency Medicine | Admitting: Emergency Medicine

## 2019-06-08 DIAGNOSIS — J439 Emphysema, unspecified: Secondary | ICD-10-CM | POA: Diagnosis not present

## 2019-06-08 NOTE — Progress Notes (Signed)
Daily Session Note  Patient Details  Name: Peter Mahoney MRN: [id number] Date of Birth: 1960-10-24 Referring Provider:     PULMONARY REHAB OTHER RESP ORIENTATION from 05/28/2019 in [place]  Referring Provider  Dr. Lamonte Sakai      Encounter Date: 06/08/2019  Check In: Session Check In - 06/08/19 1100      Check-In   Supervising physician immediately available to respond to emergencies  See telemetry face sheet for immediately available MD    Location  [facility]    Staff Present  Russella Dar, MS, EP, [facility], Exercise Physiologist;Other    Virtual Visit  No    Medication changes reported      No    Fall or balance concerns reported     No    Tobacco Cessation  No Change    Warm-up and Cool-down  Performed as group-led instruction    Resistance Training Performed  Yes    VAD Patient?  No    PAD/SET Patient?  No      Pain Assessment   Currently in Pain?  No/denies    Pain Score  0-No pain    Multiple Pain Sites  No       Capillary Blood Glucose: No results found for this or any previous visit (from the past 24 hour(s)).    Social History   Tobacco Use  Smoking Status Former Smoker  . Packs/day: 1.00  . Years: 35.00  . Pack years: 35.00  . Types: Cigarettes  . Quit date: 11/01/2015  . Years since quitting: 3.6  Smokeless Tobacco Never Used    Goals Met:  Proper associated with RPD/PD & O2 Sat Independence with exercise equipment Improved SOB with ADL's Using PLB without cueing & demonstrates good technique Exercise tolerated well Personal goals reviewed No report of cardiac concerns or symptoms Strength training completed today  Goals Unmet:  Not Applicable  Comments: check out 12:00   Dr. Kate Sable is Medical Director for [place] and Pulmonary Rehab.

## 2019-06-10 ENCOUNTER — Encounter (HOSPITAL_COMMUNITY): Payer: BC Managed Care – PPO

## 2019-06-15 ENCOUNTER — Other Ambulatory Visit: Payer: Self-pay

## 2019-06-15 ENCOUNTER — Encounter (HOSPITAL_COMMUNITY)
Admission: RE | Admit: 2019-06-15 | Discharge: 2019-06-15 | Disposition: A | Discharge status: Home / Self Care | Payer: BC Managed Care – PPO | Source: Ambulatory Visit | Attending: Emergency Medicine | Admitting: Emergency Medicine

## 2019-06-15 DIAGNOSIS — J439 Emphysema, unspecified: Secondary | ICD-10-CM

## 2019-06-15 NOTE — Progress Notes (Signed)
Daily Session Note  Patient Details  Name: Peter Mahoney MRN: [id number] Date of Birth: 08/10/1960 Referring Provider:     PULMONARY REHAB OTHER RESP ORIENTATION from 05/28/2019 in [place]  Referring Provider  Dr. Lamonte Sakai      Encounter Date: 06/15/2019  Check In: Session Check In - 06/15/19 1045      Check-In   Supervising physician immediately available to respond to emergencies  See telemetry face sheet for immediately available MD    Location  [facility]    Staff Present  Russella Dar, MS, EP, [facility], Exercise Physiologist;Other    Virtual Visit  No    Medication changes reported      No    Fall or balance concerns reported     No    Tobacco Cessation  No Change    Warm-up and Cool-down  Performed as group-led instruction    Resistance Training Performed  Yes    VAD Patient?  No    PAD/SET Patient?  No      Pain Assessment   Currently in Pain?  No/denies    Pain Score  0-No pain    Multiple Pain Sites  No       Capillary Blood Glucose: No results found for this or any previous visit (from the past 24 hour(s)).    Social History   Tobacco Use  Smoking Status Former Smoker  . Packs/day: 1.00  . Years: 35.00  . Pack years: 35.00  . Types: Cigarettes  . Quit date: 11/01/2015  . Years since quitting: 3.6  Smokeless Tobacco Never Used    Goals Met:  Proper associated with RPD/PD & O2 Sat Independence with exercise equipment Improved SOB with ADL's Using PLB without cueing & demonstrates good technique Exercise tolerated well Personal goals reviewed No report of cardiac concerns or symptoms Strength training completed today  Goals Unmet:  Not Applicable  Comments: check out 11:45   Dr. Kate Sable is Medical Director for [place] and Pulmonary Rehab.

## 2019-06-17 ENCOUNTER — Encounter (HOSPITAL_COMMUNITY)
Admission: RE | Admit: 2019-06-17 | Discharge: 2019-06-17 | Disposition: A | Discharge status: Home / Self Care | Payer: BC Managed Care – PPO | Source: Ambulatory Visit | Attending: Emergency Medicine | Admitting: Emergency Medicine

## 2019-06-17 ENCOUNTER — Other Ambulatory Visit: Payer: Self-pay

## 2019-06-17 DIAGNOSIS — J439 Emphysema, unspecified: Secondary | ICD-10-CM | POA: Diagnosis not present

## 2019-06-17 NOTE — Progress Notes (Signed)
Daily Session Note  Patient Details  Name: Peter Mahoney MRN: [id number] Date of Birth: 1960/07/17 Referring Provider:     PULMONARY REHAB OTHER RESP ORIENTATION from 05/28/2019 in [place]  Referring Provider  Dr. Lamonte Sakai      Encounter Date: 06/17/2019  Check In:   Capillary Blood Glucose: No results found for this or any previous visit (from the past 24 hour(s)).    Social History   Tobacco Use  Smoking Status Former Smoker  . Packs/day: 1.00  . Years: 35.00  . Pack years: 35.00  . Types: Cigarettes  . Quit date: 11/01/2015  . Years since quitting: 3.6  Smokeless Tobacco Never Used    Goals Met:  Proper associated with RPD/PD & O2 Sat Independence with exercise equipment Improved SOB with ADL's Using PLB without cueing & demonstrates good technique Exercise tolerated well Personal goals reviewed No report of cardiac concerns or symptoms Strength training completed today  Goals Unmet:  Not Applicable  Comments: check out 11:45   Dr. Kate Sable is Medical Director for [place] and Pulmonary Rehab.

## 2019-06-22 ENCOUNTER — Encounter (HOSPITAL_COMMUNITY)
Admission: RE | Admit: 2019-06-22 | Discharge: 2019-06-22 | Disposition: A | Discharge status: Home / Self Care | Payer: BC Managed Care – PPO | Source: Ambulatory Visit | Attending: Emergency Medicine | Admitting: Emergency Medicine

## 2019-06-22 ENCOUNTER — Other Ambulatory Visit: Payer: Self-pay

## 2019-06-22 DIAGNOSIS — J439 Emphysema, unspecified: Secondary | ICD-10-CM | POA: Insufficient documentation

## 2019-06-22 NOTE — Progress Notes (Signed)
Daily Session Note  Patient Details  Name: Peter Mahoney MRN: [id number] Date of Birth: 07/10/1960 Referring Provider:     PULMONARY REHAB OTHER RESP ORIENTATION from 05/28/2019 in [place]  Referring Provider  Dr. Lamonte Sakai      Encounter Date: 06/22/2019  Check In: Session Check In - 06/22/19 1045      Check-In   Supervising physician immediately available to respond to emergencies  See telemetry face sheet for immediately available MD    Location  [facility]    Staff Present  Russella Dar, MS, EP, [facility], Exercise Physiologist;Other    Virtual Visit  No    Medication changes reported      No    Fall or balance concerns reported     No    Tobacco Cessation  No Change    Warm-up and Cool-down  Performed as group-led instruction    Resistance Training Performed  Yes    VAD Patient?  No    PAD/SET Patient?  No      Pain Assessment   Currently in Pain?  No/denies    Pain Score  0-No pain    Multiple Pain Sites  No       Capillary Blood Glucose: No results found for this or any previous visit (from the past 24 hour(s)).    Social History   Tobacco Use  Smoking Status Former Smoker  . Packs/day: 1.00  . Years: 35.00  . Pack years: 35.00  . Types: Cigarettes  . Quit date: 11/01/2015  . Years since quitting: 3.6  Smokeless Tobacco Never Used    Goals Met:  Proper associated with RPD/PD & O2 Sat Independence with exercise equipment Improved SOB with ADL's Using PLB without cueing & demonstrates good technique Exercise tolerated well Personal goals reviewed No report of cardiac concerns or symptoms Strength training completed today  Goals Unmet:  Not Applicable  Comments: check out 11:45   Dr. Kate Sable is Medical Director for [place] and Pulmonary Rehab.

## 2019-06-24 ENCOUNTER — Encounter (HOSPITAL_COMMUNITY)
Admission: RE | Admit: 2019-06-24 | Discharge: 2019-06-24 | Disposition: A | Discharge status: Home / Self Care | Payer: BC Managed Care – PPO | Source: Ambulatory Visit | Attending: Emergency Medicine | Admitting: Emergency Medicine

## 2019-06-24 ENCOUNTER — Other Ambulatory Visit: Payer: Self-pay

## 2019-06-24 DIAGNOSIS — J439 Emphysema, unspecified: Secondary | ICD-10-CM

## 2019-06-24 NOTE — Progress Notes (Signed)
Pulmonary Individual Treatment Plan  Patient Details  Name: Peter Mahoney MRN: [id number] Date of Birth: 1960-07-06 Referring Provider:     PULMONARY REHAB OTHER RESP ORIENTATION from 05/28/2019 in [facility]  Referring Provider  Dr. Lamonte Sakai      Initial Encounter Date:    [place] from 05/28/2019 in [facility]  Date  05/28/19      Visit Diagnosis: Pulmonary emphysema, unspecified emphysema type ([place])  Patient's Home Medications on Admission:   Current Outpatient Medications:  .  ascorbic acid (VITAMIN C) 1000 MG tablet, Take 500 mg by mouth daily., Disp: , Rfl:  .  cholecalciferol (VITAMIN D3) 25 MCG (1000 UNIT) tablet, Take 2,000 Units by mouth daily., Disp: , Rfl:  .  cyanocobalamin 1000 MCG tablet, Take 5,000 mcg by mouth daily. , Disp: , Rfl:  .  diazepam (VALIUM) 10 MG tablet, 10 mg at bedtime. , Disp: , Rfl:  .  RABEprazole (ACIPHEX) 20 MG tablet, Take 20 mg by mouth daily., Disp: , Rfl:  .  sildenafil (VIAGRA) 100 MG tablet, Take 1 tablet (100 mg total) by mouth daily as needed for erectile dysfunction., Disp: 10 tablet, Rfl: 6 .  tamsulosin (FLOMAX) 0.4 MG CAPS capsule, Take 0.4 mg by mouth at bedtime. , Disp: , Rfl:  .  zinc gluconate 50 MG tablet, Take 50 mg by mouth daily., Disp: , Rfl:   Past Medical History: Past Medical History:  Diagnosis Date  . Dental crown present   . GERD (gastroesophageal reflux disease)   . Post-operative infection 09/2011   left leg  . Seasonal allergies   . Wound infection after surgery, left lateral leg 10/08/2011    Tobacco Use: Social History   Tobacco Use  Smoking Status Former Smoker  . Packs/day: 1.00  . Years: 35.00  . Pack years: 35.00  . Types: Cigarettes  . Quit date: 11/01/2015  . Years since quitting: 3.6  Smokeless Tobacco Never Used    Labs: Recent Review Flowsheet Data    There is no flowsheet data to display.      Capillary Blood  Glucose: No results found for: GLUCAP   Pulmonary Assessment Scores: Pulmonary Assessment Scores    Row Name 05/28/19 1152         ADL UCSD   ADL Phase  Entry     SOB Score total  105     Rest  4     Walk  14     Stairs  5     Bath  5     Dress  4     Shop  4       CAT Score   CAT Score  31       mMRC Score   mMRC Score  5       UCSD: Self-administered rating of dyspnea associated with activities of daily living (ADLs) 6-point scale (0 = "not at all" to 5 = "maximal or unable to do because of breathlessness")  Scoring Scores range from 0 to 120.  Minimally important difference is 5 units  CAT: CAT can identify the health impairment of COPD patients and is better correlated with disease progression.  CAT has a scoring range of zero to 40. The CAT score is classified into four groups of low (less than 10), medium (10 - 20), high (21-30) and very high (31-40) based on the impact level of disease on health status. A CAT score over  10 suggests significant symptoms.  A worsening CAT score could be explained by an exacerbation, poor medication adherence, poor inhaler technique, or progression of COPD or comorbid conditions.  CAT MCID is 2 points  mMRC: mMRC (Modified Medical Research Council) Dyspnea Scale is used to assess the degree of baseline functional disability in patients of respiratory disease due to dyspnea. No minimal important difference is established. A decrease in score of 1 point or greater is considered a positive change.   Pulmonary Function Assessment: Pulmonary Function Assessment - 05/28/19 1151      Pulmonary Function Tests   FVC%  92 %    FEV1%  91 %    FEV1/FVC Ratio  99    DLCO%  48 %      Initial Spirometry Results   FVC%  92 %    FEV1%  91 %    FEV1/FVC Ratio  99      Post Bronchodilator Spirometry Results   FVC%  91 %    FEV1%  88 %    FEV1/FVC Ratio  97      Breath   Bilateral Breath Sounds  Clear    Shortness of Breath  Yes        Exercise Target Goals: Exercise Program Goal: Individual exercise prescription set using results from initial 6 min walk test and THRR while considering  patient's activity barriers and safety.   Exercise Prescription Goal: Initial exercise prescription builds to 30-45 minutes a day of aerobic activity, 2-3 days per week.  Home exercise guidelines will be given to patient during program as part of exercise prescription that the participant will acknowledge.  Activity Barriers & Risk Stratification: Activity Barriers & Cardiac Risk Stratification - 05/28/19 1124      Activity Barriers & Cardiac Risk Stratification   Activity Barriers  Shortness of Breath;Other (comment)   knee surgeryx4, plantar fascitis (L) foot   Cardiac Risk Stratification  Low       6 Minute Walk: 6 Minute Walk    Row Name 05/28/19 1121         6 Minute Walk   Phase  Initial     Distance  1400 feet     Walk Time  6 minutes     # of Rest Breaks  0     MPH  2.65     METS  3.03     RPE  14     Perceived Dyspnea   16     VO2 Peak  13.49     Symptoms  Yes (comment)     Comments  SOB     Resting HR  66 bpm     Resting BP  120/80     Resting Oxygen Saturation   97 %     Exercise Oxygen Saturation  during 6 min walk  94 %     Max Ex. HR  99 bpm     Max Ex. BP  124/84     2 Minute Post BP  120/80        Oxygen Initial Assessment: Oxygen Initial Assessment - 05/28/19 1149      Home Oxygen   Home Oxygen Device  None    Sleep Oxygen Prescription  None    Home Exercise Oxygen Prescription  None    Home at Rest Exercise Oxygen Prescription  None      Initial 6 min Walk   Oxygen Used  None      Program Oxygen Prescription  Program Oxygen Prescription  None      Intervention   Short Term Goals  To learn and understand importance of monitoring SPO2 with pulse oximeter and demonstrate accurate use of the pulse oximeter.;To learn and understand importance of maintaining oxygen saturations>88%;To  learn and demonstrate proper pursed lip breathing techniques or other breathing techniques.    Long  Term Goals  Verbalizes importance of monitoring SPO2 with pulse oximeter and return demonstration;Maintenance of O2 saturations>88%;Exhibits proper breathing techniques, such as pursed lip breathing or other method taught during program session       Oxygen Re-Evaluation: Oxygen Re-Evaluation    Row Name 06/24/19 1256             Program Oxygen Prescription   Program Oxygen Prescription  None         Home Oxygen   Home Oxygen Device  None       Sleep Oxygen Prescription  None       Home Exercise Oxygen Prescription  None       Home at Rest Exercise Oxygen Prescription  None         Goals/Expected Outcomes   Short Term Goals  To learn and understand importance of monitoring SPO2 with pulse oximeter and demonstrate accurate use of the pulse oximeter.;To learn and understand importance of maintaining oxygen saturations>88%;To learn and demonstrate proper pursed lip breathing techniques or other breathing techniques.       Long  Term Goals  Verbalizes importance of monitoring SPO2 with pulse oximeter and return demonstration;Maintenance of O2 saturations>88%;Exhibits proper breathing techniques, such as pursed lip breathing or other method taught during program session       Comments  Patiient is ablet to verbalize the importance of maintaining his O2 saturation >88% and the importance of monitoring his O2 saturation. He exhibits proper pursed lip breathing techniques in class.       Goals/Expected Outcomes  Patient will continue to meet both his short and long term goals.          Oxygen Discharge (Final Oxygen Re-Evaluation): Oxygen Re-Evaluation - 06/24/19 1256      Program Oxygen Prescription   Program Oxygen Prescription  None      Home Oxygen   Home Oxygen Device  None    Sleep Oxygen Prescription  None    Home Exercise Oxygen Prescription  None    Home at Rest Exercise  Oxygen Prescription  None      Goals/Expected Outcomes   Short Term Goals  To learn and understand importance of monitoring SPO2 with pulse oximeter and demonstrate accurate use of the pulse oximeter.;To learn and understand importance of maintaining oxygen saturations>88%;To learn and demonstrate proper pursed lip breathing techniques or other breathing techniques.    Long  Term Goals  Verbalizes importance of monitoring SPO2 with pulse oximeter and return demonstration;Maintenance of O2 saturations>88%;Exhibits proper breathing techniques, such as pursed lip breathing or other method taught during program session    Comments  Patiient is ablet to verbalize the importance of maintaining his O2 saturation >88% and the importance of monitoring his O2 saturation. He exhibits proper pursed lip breathing techniques in class.    Goals/Expected Outcomes  Patient will continue to meet both his short and long term goals.       Initial Exercise Prescription: Initial Exercise Prescription - 05/28/19 1100      Date of Initial Exercise RX and Referring Provider   Date  05/28/19    Referring Provider  Dr.  Byrum    Expected Discharge Date  08/25/19      Treadmill   MPH  1.3    Grade  0    Minutes  17    METs  1.99      Recumbant Elliptical   Level  1    RPM  45    Watts  64    Minutes  20    METs  3.5      Prescription Details   Frequency (times per week)  2    Duration  Progress to 30 minutes of continuous aerobic without signs/symptoms of physical distress      Intensity   THRR 40-80% of Max Heartrate  [id number]    Ratings of Perceived Exertion  11-13    Perceived Dyspnea  0-4      Progression   Progression  Continue to progress workloads to maintain intensity without signs/symptoms of physical distress.      Resistance Training   Training Prescription  Yes    Weight  1    Reps  10-15       Perform Capillary Blood Glucose checks as needed.  Exercise Prescription  Changes:  Exercise Prescription Changes    Row Name 06/14/19 1500 06/23/19 1400           Response to Exercise   Blood Pressure (Admit)  142/88  148/82      Blood Pressure (Exercise)  148/82  138/80      Blood Pressure (Exit)  118/72  104/70      Heart Rate (Admit)  82 bpm  86 bpm      Heart Rate (Exercise)  105 bpm  118 bpm      Heart Rate (Exit)  86 bpm  81 bpm      Oxygen Saturation (Admit)  98 %  97 %      Oxygen Saturation (Exercise)  95 %  95 %      Oxygen Saturation (Exit)  97 %  97 %      Rating of Perceived Exertion (Exercise)  12  12      Perceived Dyspnea (Exercise)  13  11      Symptoms  SOB  SOB      Comments  has not been 2wks  -      Duration  Continue with 30 min of aerobic exercise without signs/symptoms of physical distress.  Continue with 30 min of aerobic exercise without signs/symptoms of physical distress.      Intensity  THRR unchanged  THRR unchanged        Progression   Progression  Continue to progress workloads to maintain intensity without signs/symptoms of physical distress.  Continue to progress workloads to maintain intensity without signs/symptoms of physical distress.      Average METs  2.64  2.54        Resistance Training   Training Prescription  Yes  Yes      Weight  1  2      Reps  10-15  10-15      Time  10 Minutes  10 Minutes        Oxygen   Liters  0  0        Treadmill   MPH  1.3  1.4      Grade  0  0      Minutes  17  17      METs  1.99  2.07  Recumbant Elliptical   Level  1  1      RPM  50  47      Watts  60  57      Minutes  22  22      METs  3.3  3.1        Home Exercise Plan   Plans to continue exercise at  Home (comment)  Home (comment)      Frequency  Add 3 additional days to program exercise sessions.  Add 3 additional days to program exercise sessions.      Initial Home Exercises Provided  05/28/19  05/28/19         Exercise Comments:  Exercise Comments    Row Name 06/14/19 1520 06/23/19 1528          Exercise Comments  Patient is doing well for such a short period of time. Will continue to progress his workloads.  Patient is doing well with his first 2 week. Will continue to progress as tolerated.         Exercise Goals and Review:  Exercise Goals    Row Name 05/28/19 1141             Exercise Goals   Increase Physical Activity  Yes       Intervention  Provide advice, education, support and counseling about physical activity/exercise needs.;Develop an individualized exercise prescription for aerobic and resistive training based on initial evaluation findings, risk stratification, comorbidities and participant's personal goals.       Expected Outcomes  Short Term: Attend rehab on a regular basis to increase amount of physical activity.;Long Term: Add in home exercise to make exercise part of routine and to increase amount of physical activity.       Increase Strength and Stamina  Yes       Intervention  Provide advice, education, support and counseling about physical activity/exercise needs.;Develop an individualized exercise prescription for aerobic and resistive training based on initial evaluation findings, risk stratification, comorbidities and participant's personal goals.       Expected Outcomes  Short Term: Perform resistance training exercises routinely during rehab and add in resistance training at home;Long Term: Improve cardiorespiratory fitness, muscular endurance and strength as measured by increased METs and functional capacity (6MWT)       Able to understand and use rate of perceived exertion (RPE) scale  Yes       Intervention  Provide education and explanation on how to use RPE scale       Expected Outcomes  Short Term: Able to use RPE daily in rehab to express subjective intensity level;Long Term:  Able to use RPE to guide intensity level when exercising independently       Able to understand and use Dyspnea scale  Yes       Intervention  Provide education and  explanation on how to use Dyspnea scale       Expected Outcomes  Short Term: Able to use Dyspnea scale daily in rehab to express subjective sense of shortness of breath during exertion;Long Term: Able to use Dyspnea scale to guide intensity level when exercising independently       Knowledge and understanding of Target Heart Rate Range (THRR)  Yes       Intervention  Provide education and explanation of THRR including how the numbers were predicted and where they are located for reference       Expected Outcomes  Short Term: Able to use daily  as guideline for intensity in rehab;Long Term: Able to use THRR to govern intensity when exercising independently       Able to check pulse independently  Yes       Intervention  Provide education and demonstration on how to check pulse in carotid and radial arteries.;Review the importance of being able to check your own pulse for safety during independent exercise       Expected Outcomes  Short Term: Able to explain why pulse checking is important during independent exercise;Long Term: Able to check pulse independently and accurately       Understanding of Exercise Prescription  Yes       Intervention  Provide education, explanation, and written materials on patient's individual exercise prescription       Expected Outcomes  Short Term: Able to explain program exercise prescription;Long Term: Able to explain home exercise prescription to exercise independently          Exercise Goals Re-Evaluation : Exercise Goals Re-Evaluation    Row Name 06/14/19 1516 06/23/19 1525           Exercise Goal Re-Evaluation   Exercise Goals Review  Increase Physical Activity;Increase Strength and Stamina  Increase Physical Activity;Increase Strength and Stamina      Comments  Patient has just started program. This is his 4th session. He is already making progress with walking on the TM and using the seated ellipitcal. We will progress his workload in the next 2 weeks.   Patient has done well. This is his first progression. Will monitor how he tolerates it and his level of SOB. He has improved walking on the TM. He can now do the entire 17 minutes without stopping. He tolerates the seated elliptical well.      Expected Outcomes  His main goals are to reduce his SOB and to become more active. We will monitor his this and ask patient if he feels he is reaching his expected goals.  Reach in expected goals of decreasing his SOB, being able to become more active.         Discharge Exercise Prescription (Final Exercise Prescription Changes): Exercise Prescription Changes - 06/23/19 1400      Response to Exercise   Blood Pressure (Admit)  148/82    Blood Pressure (Exercise)  138/80    Blood Pressure (Exit)  104/70    Heart Rate (Admit)  86 bpm    Heart Rate (Exercise)  118 bpm    Heart Rate (Exit)  81 bpm    Oxygen Saturation (Admit)  97 %    Oxygen Saturation (Exercise)  95 %    Oxygen Saturation (Exit)  97 %    Rating of Perceived Exertion (Exercise)  12    Perceived Dyspnea (Exercise)  11    Symptoms  SOB    Duration  Continue with 30 min of aerobic exercise without signs/symptoms of physical distress.    Intensity  THRR unchanged      Progression   Progression  Continue to progress workloads to maintain intensity without signs/symptoms of physical distress.    Average METs  2.54      Resistance Training   Training Prescription  Yes    Weight  2    Reps  10-15    Time  10 Minutes      Oxygen   Liters  0      Treadmill   MPH  1.4    Grade  0    Minutes  17    METs  2.07      Recumbant Elliptical   Level  1    RPM  47    Watts  57    Minutes  22    METs  3.1      Home Exercise Plan   Plans to continue exercise at  Home (comment)    Frequency  Add 3 additional days to program exercise sessions.    Initial Home Exercises Provided  05/28/19       Nutrition:  Target Goals: Understanding of nutrition guidelines, daily intake of sodium  1500mg , cholesterol 200mg , calories 30% from fat and 7% or less from saturated fats, daily to have 5 or more servings of fruits and vegetables.  Biometrics: Pre Biometrics - 05/28/19 1144      Pre Biometrics   Height  5\' 10"  (1.778 m)    Weight  85.6 kg    Waist Circumference  38 inches    Hip Circumference  36 inches    Waist to Hip Ratio  1.06 %    BMI (Calculated)  27.08    Triceps Skinfold  8 mm    % Body Fat  23.2 %    Grip Strength  31.6 kg    Flexibility  0 in    Single Leg Stand  60 seconds        Nutrition Therapy Plan and Nutrition Goals: Nutrition Therapy & Goals - 06/24/19 1254      Personal Nutrition Goals   Comments  We continue to work on scheduling patients for the RD class. Patient scored 66 on his medificts diet assessment score. We have provided handouts for education. Will continue to monitor for progress.      Intervention Plan   Intervention  Nutrition handout(s) given to patient.       Nutrition Assessments: Nutrition Assessments - 05/28/19 1158      MEDFICTS Scores   Pre Score  66       Nutrition Goals Re-Evaluation:   Nutrition Goals Discharge (Final Nutrition Goals Re-Evaluation):   Psychosocial: Target Goals: Acknowledge presence or absence of significant depression and/or stress, maximize coping skills, provide positive support system. Participant is able to verbalize types and ability to use techniques and skills needed for reducing stress and depression.  Initial Review & Psychosocial Screening: Initial Psych Review & Screening - 05/28/19 1154      Initial Review   Current issues with  None Identified      Family Dynamics   Good Support System?  Yes    Concerns  No support system      Barriers   Psychosocial barriers to participate in program  There are no identifiable barriers or psychosocial needs.      Screening Interventions   Interventions  Encouraged to exercise    Expected Outcomes  Short Term goal: Identification  and review with participant of any Quality of Life or Depression concerns found by scoring the questionnaire.;Long Term goal: The participant improves quality of Life and PHQ9 Scores as seen by post scores and/or verbalization of changes       Quality of Life Scores: Quality of Life - 05/28/19 1155      Quality of Life Scores   Health/Function Pre  12.38 %    Socioeconomic Pre  30 %    Psych/Spiritual Pre  27.43 %    Family Pre  24 %    GLOBAL Pre  20.29 %      Scores  of 19 and below usually indicate a poorer quality of life in these areas.  A difference of  2-3 points is a clinically meaningful difference.  A difference of 2-3 points in the total score of the Quality of Life Index has been associated with significant improvement in overall quality of life, self-image, physical symptoms, and general health in studies assessing change in quality of life.   PHQ-9: Recent Review Flowsheet Data    Depression screen [facility] 2/9 05/28/2019   Decreased Interest 0   Down, Depressed, Hopeless 0   PHQ - 2 Score 0   Altered sleeping 3   Tired, decreased energy 2   Change in appetite 0   Feeling bad or failure about yourself  0   Trouble concentrating 0   Moving slowly or fidgety/restless 0   Suicidal thoughts 0   PHQ-9 Score 5   Difficult doing work/chores Very difficult     Interpretation of Total Score  Total Score Depression Severity:  1-4 = Minimal depression, 5-9 = Mild depression, 10-14 = Moderate depression, 15-19 = Moderately severe depression, 20-27 = Severe depression   Psychosocial Evaluation and Intervention: Psychosocial Evaluation - 05/28/19 1155      Psychosocial Evaluation & Interventions   Interventions  Encouraged to exercise with the program and follow exercise prescription    Continue Psychosocial Services   No Follow up required       Psychosocial Re-Evaluation: Psychosocial Re-Evaluation    [place] Name 06/24/19 1300             Psychosocial Re-Evaluation    Current issues with  None Identified       Comments  Patient's initial QOL was 20.29 and PHQ-9 was 5. He continues to take Valium 10 mg daily. He has no psychosocial issues identified.       Expected Outcomes  Patient will have no psychosocial issues identified at discharge.       Interventions  Encouraged to attend Pulmonary Rehabilitation for the exercise;Relaxation education;Stress management education       Continue Psychosocial Services   No Follow up required          Psychosocial Discharge (Final Psychosocial Re-Evaluation): Psychosocial Re-Evaluation - 06/24/19 1300      Psychosocial Re-Evaluation   Current issues with  None Identified    Comments  Patient's initial QOL was 20.29 and PHQ-9 was 5. He continues to take Valium 10 mg daily. He has no psychosocial issues identified.    Expected Outcomes  Patient will have no psychosocial issues identified at discharge.    Interventions  Encouraged to attend Pulmonary Rehabilitation for the exercise;Relaxation education;Stress management education    Continue Psychosocial Services   No Follow up required        Education: Education Goals: Education classes will be provided on a weekly basis, covering required topics. Participant will state understanding/return demonstration of topics presented.  Learning Barriers/Preferences: Learning Barriers/Preferences - 05/28/19 1159      Learning Barriers/Preferences   Learning Barriers  None    Learning Preferences  Group Instruction;Skilled Demonstration       Education Topics: How Lungs Work and Diseases: - Discuss the anatomy of the lungs and diseases that can affect the lungs, such as COPD.   Exercise: -Discuss the importance of exercise, FITT principles of exercise, normal and abnormal responses to exercise, and how to exercise safely.   Environmental Irritants: -Discuss types of environmental irritants and how to limit exposure to environmental irritants.   Meds/Inhalers  and  oxygen: - Discuss respiratory medications, definition of an inhaler and oxygen, and the proper way to use an inhaler and oxygen.   Energy Saving Techniques: - Discuss methods to conserve energy and decrease shortness of breath when performing activities of daily living.    Bronchial Hygiene / Breathing Techniques: - Discuss breathing mechanics, pursed-lip breathing technique,  proper posture, effective ways to clear airways, and other functional breathing techniques   Cleaning Equipment: - Provides group verbal and written instruction about the health risks of elevated stress, cause of high stress, and healthy ways to reduce stress.   Nutrition I: Fats: - Discuss the types of cholesterol, what cholesterol does to the body, and how cholesterol levels can be controlled.   Nutrition II: Labels: -Discuss the different components of food labels and how to read food labels.   PULMONARY REHAB OTHER RESPIRATORY from 06/24/2019 in [place]  Date  06/17/19  Educator  DWynetta Emery  Instruction Review Code  2- Demonstrated Understanding      Respiratory Infections: - Discuss the signs and symptoms of respiratory infections, ways to prevent respiratory infections, and the importance of seeking medical treatment when having a respiratory infection.   Stress I: Signs and Symptoms: - Discuss the causes of stress, how stress may lead to anxiety and depression, and ways to limit stress.   Stress II: Relaxation: -Discuss relaxation techniques to limit stress.   PULMONARY REHAB OTHER RESPIRATORY from 06/24/2019 in [place]  Date  06/24/19  Educator  DC  Instruction Review Code  2- Demonstrated Understanding      Oxygen for Home/Travel: - Discuss how to prepare for travel when on oxygen and proper ways to transport and store oxygen to ensure safety.   Knowledge Questionnaire Score: Knowledge Questionnaire Score - 05/28/19 1204       Knowledge Questionnaire Score   Pre Score  16/18       Core Components/Risk Factors/Patient Goals at Admission: Personal Goals and Risk Factors at Admission - 05/28/19 1204      Core Components/Risk Factors/Patient Goals on Admission    Weight Management  Weight Maintenance    Personal Goal Other  Yes    Personal Goal  Help with SOB, Be more active    Intervention  Attend PR 2 x week and supplement with 3 x week of TM waling and stationary bike riding at home.    Expected Outcomes  Reach goals       Core Components/Risk Factors/Patient Goals Review:  Goals and Risk Factor Review    Row Name 06/24/19 1259             Core Components/Risk Factors/Patient Goals Review   Personal Goals Review  Weight Management/Obesity;Improve shortness of breath with ADL's Decrease SOB; be more active.       Review  Patient has completed 7 sessions gaining 6 lbs since his orientation visit. He is doing well in the program with some progressions. He says he has not noticed an improvement in his SOB yet but is enjoying the program. Will continue to monitor for progress.       Expected Outcomes  Patient will continue to attend sessions and complete the program meeting his personal goals.          Core Components/Risk Factors/Patient Goals at Discharge (Final Review):  Goals and Risk Factor Review - 06/24/19 1259      Core Components/Risk Factors/Patient Goals Review   Personal Goals Review  Weight Management/Obesity;Improve shortness of  breath with ADL's   Decrease SOB; be more active.   Review  Patient has completed 7 sessions gaining 6 lbs since his orientation visit. He is doing well in the program with some progressions. He says he has not noticed an improvement in his SOB yet but is enjoying the program. Will continue to monitor for progress.    Expected Outcomes  Patient will continue to attend sessions and complete the program meeting his personal goals.       ITP Comments: ITP Comments     Row Name 05/28/19 1117           ITP Comments  Patient is eager to get started.          Comments: ITP REVIEW Pt is making expected progress toward pulmonary rehab goals after completing 7 sessions. Recommend continued exercise, life style modification, education, and utilization of breathing techniques to increase stamina and strength and decrease shortness of breath with exertion.

## 2019-06-24 NOTE — Progress Notes (Signed)
Daily Session Note  Patient Details  Name: Peter Mahoney MRN: [id number] Date of Birth: [birth year] Referring Provider:     PULMONARY REHAB OTHER RESP ORIENTATION from 05/28/2019 in [place]  Referring Provider  Dr. Lamonte Sakai      Encounter Date: 06/24/2019  Check In: Session Check In - 06/24/19 1141      Check-In   Supervising physician immediately available to respond to emergencies  See telemetry face sheet for immediately available MD    Location  [facility]    Staff Present  Russella Dar, MS, EP, [facility], Exercise Physiologist;Debra Wynetta Emery, RN, BSN    Virtual Visit  No    Medication changes reported      No    Fall or balance concerns reported     No    Tobacco Cessation  No Change    Warm-up and Cool-down  Performed as group-led instruction    Resistance Training Performed  Yes    VAD Patient?  No    PAD/SET Patient?  No      Pain Assessment   Currently in Pain?  No/denies    Pain Score  0-No pain    Multiple Pain Sites  No       Capillary Blood Glucose: No results found for this or any previous visit (from the past 24 hour(s)).    Social History   Tobacco Use  Smoking Status Former Smoker  . Packs/day: 1.00  . Years: 35.00  . Pack years: 35.00  . Types: Cigarettes  . Quit date: 11/01/2015  . Years since quitting: 3.6  Smokeless Tobacco Never Used    Goals Met:  Proper associated with RPD/PD & O2 Sat Independence with exercise equipment Improved SOB with ADL's Exercise tolerated well Personal goals reviewed No report of cardiac concerns or symptoms Strength training completed today  Goals Unmet:  Not Applicable  Comments: Check out: 1145   Dr. Kate Sable is Medical Director for [place] and Pulmonary Rehab.

## 2019-06-29 ENCOUNTER — Encounter (HOSPITAL_COMMUNITY)
Admission: RE | Admit: 2019-06-29 | Discharge: 2019-06-29 | Disposition: A | Discharge status: Home / Self Care | Payer: BC Managed Care – PPO | Source: Ambulatory Visit | Attending: Emergency Medicine | Admitting: Emergency Medicine

## 2019-06-29 ENCOUNTER — Other Ambulatory Visit: Payer: Self-pay

## 2019-06-29 DIAGNOSIS — J439 Emphysema, unspecified: Secondary | ICD-10-CM | POA: Diagnosis not present

## 2019-06-29 NOTE — Progress Notes (Signed)
Daily Session Note  Patient Details  Name: ERNST CUMPSTON MRN: [id number] Date of Birth: Dec 06, 1960 Referring Provider:     PULMONARY REHAB OTHER RESP ORIENTATION from 05/28/2019 in [place]  Referring Provider  Dr. Lamonte Sakai      Encounter Date: 06/29/2019  Check In: Session Check In - 06/29/19 1045      Check-In   Supervising physician immediately available to respond to emergencies  See telemetry face sheet for immediately available MD    Location  [facility]    Staff Present  Russella Dar, MS, EP, [facility], Exercise Physiologist;Other    Virtual Visit  No    Medication changes reported      No    Fall or balance concerns reported     No    Tobacco Cessation  No Change    Warm-up and Cool-down  Performed as group-led instruction    Resistance Training Performed  Yes    VAD Patient?  No    PAD/SET Patient?  No      Pain Assessment   Currently in Pain?  No/denies    Pain Score  0-No pain    Multiple Pain Sites  No       Capillary Blood Glucose: No results found for this or any previous visit (from the past 24 hour(s)).    Social History   Tobacco Use  Smoking Status Former Smoker  . Packs/day: 1.00  . Years: 35.00  . Pack years: 35.00  . Types: Cigarettes  . Quit date: 11/01/2015  . Years since quitting: 3.6  Smokeless Tobacco Never Used    Goals Met:  Proper associated with RPD/PD & O2 Sat Independence with exercise equipment Improved SOB with ADL's Using PLB without cueing & demonstrates good technique Exercise tolerated well Personal goals reviewed No report of cardiac concerns or symptoms Strength training completed today  Goals Unmet:  Not Applicable  Comments: check out 11:45    Dr. Kate Sable is Medical Director for [place] and Pulmonary Rehab.

## 2019-07-01 ENCOUNTER — Other Ambulatory Visit: Payer: Self-pay

## 2019-07-01 ENCOUNTER — Encounter (HOSPITAL_COMMUNITY)
Admission: RE | Admit: 2019-07-01 | Discharge: 2019-07-01 | Disposition: A | Discharge status: Home / Self Care | Payer: BC Managed Care – PPO | Source: Ambulatory Visit | Attending: Emergency Medicine | Admitting: Emergency Medicine

## 2019-07-01 DIAGNOSIS — J439 Emphysema, unspecified: Secondary | ICD-10-CM

## 2019-07-01 NOTE — Progress Notes (Signed)
Daily Session Note  Patient Details  Name: Peter Mahoney MRN: [id number] Date of Birth: May 31, 1960 Referring Provider:     PULMONARY REHAB OTHER RESP ORIENTATION from 05/28/2019 in [place]  Referring Provider  Dr. Lamonte Sakai      Encounter Date: 07/01/2019  Check In: Session Check In - 07/01/19 1045      Check-In   Supervising physician immediately available to respond to emergencies  See telemetry face sheet for immediately available MD    Location  [facility]    Staff Present  Russella Dar, MS, EP, [facility], Exercise Physiologist;Melinda Pottinger Wynetta Emery, RN, BSN    Virtual Visit  No    Medication changes reported      No    Fall or balance concerns reported     No    Tobacco Cessation  No Change    Warm-up and Cool-down  Performed as group-led instruction    Resistance Training Performed  Yes    VAD Patient?  No    PAD/SET Patient?  No      Pain Assessment   Currently in Pain?  No/denies    Pain Score  0-No pain    Multiple Pain Sites  No       Capillary Blood Glucose: No results found for this or any previous visit (from the past 24 hour(s)).    Social History   Tobacco Use  Smoking Status Former Smoker  . Packs/day: 1.00  . Years: 35.00  . Pack years: 35.00  . Types: Cigarettes  . Quit date: 11/01/2015  . Years since quitting: 3.6  Smokeless Tobacco Never Used    Goals Met:  Proper associated with RPD/PD & O2 Sat Independence with exercise equipment Improved SOB with ADL's Using PLB without cueing & demonstrates good technique Exercise tolerated well No report of cardiac concerns or symptoms Strength training completed today  Goals Unmet:  Not Applicable  Comments: Check out 1145.   Dr. Kate Sable is Medical Director for [facility] and Pulmonary Rehab.

## 2019-07-06 ENCOUNTER — Encounter (HOSPITAL_COMMUNITY)
Admission: RE | Admit: 2019-07-06 | Discharge: 2019-07-06 | Disposition: A | Discharge status: Home / Self Care | Payer: BC Managed Care – PPO | Source: Ambulatory Visit | Attending: Emergency Medicine | Admitting: Emergency Medicine

## 2019-07-06 ENCOUNTER — Other Ambulatory Visit: Payer: Self-pay

## 2019-07-06 DIAGNOSIS — J439 Emphysema, unspecified: Secondary | ICD-10-CM | POA: Diagnosis not present

## 2019-07-06 NOTE — Progress Notes (Signed)
Daily Session Note  Patient Details  Name: Peter Mahoney MRN: [id number] Date of Birth: 07-Dec-1960 Referring Provider:     PULMONARY REHAB OTHER RESP ORIENTATION from 05/28/2019 in [place]  Referring Provider  Dr. Lamonte Sakai      Encounter Date: 07/06/2019  Check In: Session Check In - 07/06/19 1100      Check-In   Supervising physician immediately available to respond to emergencies  See telemetry face sheet for immediately available MD    Location  [facility]    Staff Present  Russella Dar, MS, EP, [facility], Exercise Physiologist;Other    Virtual Visit  No    Medication changes reported      No    Fall or balance concerns reported     No    Warm-up and Cool-down  Performed as group-led instruction    Resistance Training Performed  Yes    VAD Patient?  No    PAD/SET Patient?  No      Pain Assessment   Currently in Pain?  No/denies    Pain Score  0-No pain    Multiple Pain Sites  No       Capillary Blood Glucose: No results found for this or any previous visit (from the past 24 hour(s)).    Social History   Tobacco Use  Smoking Status Former Smoker  . Packs/day: 1.00  . Years: 35.00  . Pack years: 35.00  . Types: Cigarettes  . Quit date: 11/01/2015  . Years since quitting: 3.6  Smokeless Tobacco Never Used    Goals Met:  Proper associated with RPD/PD & O2 Sat Independence with exercise equipment Improved SOB with ADL's Using PLB without cueing & demonstrates good technique Exercise tolerated well Personal goals reviewed No report of cardiac concerns or symptoms Strength training completed today  Goals Unmet:  Not Applicable  Comments: check out 12:00   Dr. Kate Sable is Medical Director for [place] and Pulmonary Rehab.

## 2019-07-08 ENCOUNTER — Encounter (HOSPITAL_COMMUNITY)
Admission: RE | Admit: 2019-07-08 | Discharge: 2019-07-08 | Disposition: A | Discharge status: Home / Self Care | Payer: BC Managed Care – PPO | Source: Ambulatory Visit | Attending: Emergency Medicine | Admitting: Emergency Medicine

## 2019-07-08 ENCOUNTER — Other Ambulatory Visit: Payer: Self-pay

## 2019-07-08 DIAGNOSIS — J439 Emphysema, unspecified: Secondary | ICD-10-CM

## 2019-07-08 NOTE — Progress Notes (Signed)
Daily Session Note  Patient Details  Name: Peter Mahoney MRN: [id number] Date of Birth: 07/12/1960 Referring Provider:     PULMONARY REHAB OTHER RESP ORIENTATION from 05/28/2019 in [place] [facility]  Referring Provider  Dr. Byrum      Encounter Date: 07/08/2019  Check In: Session Check In - 07/08/19 1045      Check-In   Supervising physician immediately available to respond to emergencies  See telemetry face sheet for immediately available MD    Location  [facility]    Staff Present  Diane Coad, MS, EP, [facility], Exercise Physiologist;Debra Johnson, RN, BSN    Virtual Visit  No    Medication changes reported      No    Fall or balance concerns reported     No    Tobacco Cessation  No Change    Warm-up and Cool-down  Performed as group-led instruction    Resistance Training Performed  Yes    VAD Patient?  No    PAD/SET Patient?  No      Pain Assessment   Currently in Pain?  No/denies    Pain Score  0-No pain    Multiple Pain Sites  No       Capillary Blood Glucose: No results found for this or any previous visit (from the past 24 hour(s)).    Social History   Tobacco Use  Smoking Status Former Smoker  . Packs/day: 1.00  . Years: 35.00  . Pack years: 35.00  . Types: Cigarettes  . Quit date: 11/01/2015  . Years since quitting: 3.6  Smokeless Tobacco Never Used    Goals Met:  Proper associated with RPD/PD & O2 Sat Independence with exercise equipment Improved SOB with ADL's Using PLB without cueing & demonstrates good technique Exercise tolerated well No report of cardiac concerns or symptoms Strength training completed today  Goals Unmet:  Not Applicable  Comments: Check out 1145.   Dr. Suresh Koneswaran is Medical Director for [place] [facility] and Pulmonary Rehab. 

## 2019-07-13 ENCOUNTER — Other Ambulatory Visit: Payer: Self-pay

## 2019-07-13 ENCOUNTER — Encounter (HOSPITAL_COMMUNITY)
Admission: RE | Admit: 2019-07-13 | Discharge: 2019-07-13 | Disposition: A | Discharge status: Home / Self Care | Payer: BC Managed Care – PPO | Source: Ambulatory Visit | Attending: Emergency Medicine | Admitting: Emergency Medicine

## 2019-07-13 DIAGNOSIS — J439 Emphysema, unspecified: Secondary | ICD-10-CM | POA: Diagnosis not present

## 2019-07-13 NOTE — Progress Notes (Signed)
Daily Session Note  Patient Details  Name: Peter Mahoney MRN: [id number] Date of Birth: 07/14/1960 Referring Provider:     PULMONARY REHAB OTHER RESP ORIENTATION from 05/28/2019 in [place]  Referring Provider  Dr. Lamonte Sakai      Encounter Date: 07/13/2019  Check In: Session Check In - 07/13/19 1045      Check-In   Supervising physician immediately available to respond to emergencies  See telemetry face sheet for immediately available MD    Location  [facility]    Staff Present  Russella Dar, MS, EP, [facility], Exercise Physiologist;Other    Virtual Visit  No    Medication changes reported      No    Fall or balance concerns reported     No    Warm-up and Cool-down  Performed as group-led instruction    Resistance Training Performed  Yes    VAD Patient?  No    PAD/SET Patient?  No      Pain Assessment   Currently in Pain?  No/denies    Pain Score  0-No pain    Multiple Pain Sites  No       Capillary Blood Glucose: No results found for this or any previous visit (from the past 24 hour(s)).    Social History   Tobacco Use  Smoking Status Former Smoker  . Packs/day: 1.00  . Years: 35.00  . Pack years: 35.00  . Types: Cigarettes  . Quit date: 11/01/2015  . Years since quitting: 3.6  Smokeless Tobacco Never Used    Goals Met:  Proper associated with RPD/PD & O2 Sat Independence with exercise equipment Improved SOB with ADL's Using PLB without cueing & demonstrates good technique Exercise tolerated well Personal goals reviewed No report of cardiac concerns or symptoms Strength training completed today  Goals Unmet:  Not Applicable  Comments: check out 11:45   Dr. Kate Sable is Medical Director for [place] and Pulmonary Rehab.

## 2019-07-15 ENCOUNTER — Other Ambulatory Visit: Payer: Self-pay

## 2019-07-15 ENCOUNTER — Encounter (HOSPITAL_COMMUNITY)
Admission: RE | Admit: 2019-07-15 | Discharge: 2019-07-15 | Disposition: A | Discharge status: Home / Self Care | Payer: BC Managed Care – PPO | Source: Ambulatory Visit | Attending: Emergency Medicine | Admitting: Emergency Medicine

## 2019-07-15 DIAGNOSIS — J439 Emphysema, unspecified: Secondary | ICD-10-CM | POA: Diagnosis not present

## 2019-07-15 NOTE — Progress Notes (Signed)
Daily Session Note  Patient Details  Name: Peter Mahoney MRN: [id number] Date of Birth: 07-23-1960 Referring Provider:     PULMONARY REHAB OTHER RESP ORIENTATION from 05/28/2019 in [place]  Referring Provider  Dr. Lamonte Sakai      Encounter Date: 07/15/2019  Check In: Session Check In - 07/15/19 1045      Check-In   Supervising physician immediately available to respond to emergencies  See telemetry face sheet for immediately available MD    Location  [facility]    Staff Present  Russella Dar, MS, EP, [facility], Exercise Physiologist;Maram Bently Wynetta Emery, RN, BSN    Virtual Visit  No    Medication changes reported      No    Fall or balance concerns reported     No    Tobacco Cessation  No Change    Warm-up and Cool-down  Performed as group-led instruction    Resistance Training Performed  Yes    VAD Patient?  No    PAD/SET Patient?  No      Pain Assessment   Currently in Pain?  No/denies    Pain Score  0-No pain    Multiple Pain Sites  No       Capillary Blood Glucose: No results found for this or any previous visit (from the past 24 hour(s)).    Social History   Tobacco Use  Smoking Status Former Smoker  . Packs/day: 1.00  . Years: 35.00  . Pack years: 35.00  . Types: Cigarettes  . Quit date: 11/01/2015  . Years since quitting: 3.7  Smokeless Tobacco Never Used    Goals Met:  Proper associated with RPD/PD & O2 Sat Independence with exercise equipment Improved SOB with ADL's Using PLB without cueing & demonstrates good technique Exercise tolerated well No report of cardiac concerns or symptoms Strength training completed today  Goals Unmet:  Not Applicable  Comments: Check out 1430.   Dr. Kate Sable is Medical Director for [facility] Card[facility] and Pulmonary Rehab.

## 2019-07-16 NOTE — Progress Notes (Signed)
Pulmonary Individual Treatment Plan  Patient Details  Name: Peter Mahoney MRN: [id number] Date of Birth: [birth year] Referring Provider:     PULMONARY REHAB OTHER RESP ORIENTATION from 05/28/2019 in [facility]  Referring Provider  Dr. Lamonte Sakai      Initial Encounter Date:    [place] from 05/28/2019 in [facility]  Date  05/28/19      Visit Diagnosis: Pulmonary emphysema, unspecified emphysema type ([facility])  Patient's Home Medications on Admission:   Current Outpatient Medications:  .  ascorbic acid (VITAMIN C) 1000 MG tablet, Take 500 mg by mouth daily., Disp: , Rfl:  .  cholecalciferol (VITAMIN D3) 25 MCG (1000 UNIT) tablet, Take 2,000 Units by mouth daily., Disp: , Rfl:  .  cyanocobalamin 1000 MCG tablet, Take 5,000 mcg by mouth daily. , Disp: , Rfl:  .  diazepam (VALIUM) 10 MG tablet, 10 mg at bedtime. , Disp: , Rfl:  .  RABEprazole (ACIPHEX) 20 MG tablet, Take 20 mg by mouth daily., Disp: , Rfl:  .  sildenafil (VIAGRA) 100 MG tablet, Take 1 tablet (100 mg total) by mouth daily as needed for erectile dysfunction., Disp: 10 tablet, Rfl: 6 .  tamsulosin (FLOMAX) 0.4 MG CAPS capsule, Take 0.4 mg by mouth at bedtime. , Disp: , Rfl:  .  zinc gluconate 50 MG tablet, Take 50 mg by mouth daily., Disp: , Rfl:   Past Medical History: Past Medical History:  Diagnosis Date  . Dental crown present   . GERD (gastroesophageal reflux disease)   . Post-operative infection 09/2011   left leg  . Seasonal allergies   . Wound infection after surgery, left lateral leg 10/08/2011    Tobacco Use: Social History   Tobacco Use  Smoking Status Former Smoker  . Packs/day: 1.00  . Years: 35.00  . Pack years: 35.00  . Types: Cigarettes  . Quit date: 11/01/2015  . Years since quitting: 3.7  Smokeless Tobacco Never Used    Labs: Recent Review Flowsheet Data    There is no flowsheet data to display.      Capillary Blood  Glucose: No results found for: GLUCAP   Pulmonary Assessment Scores: Pulmonary Assessment Scores    Row Name 05/28/19 1152         ADL UCSD   ADL Phase  Entry     SOB Score total  105     Rest  4     Walk  14     Stairs  5     Bath  5     Dress  4     Shop  4       CAT Score   CAT Score  31       mMRC Score   mMRC Score  5       UCSD: Self-administered rating of dyspnea associated with activities of daily living (ADLs) 6-point scale (0 = "not at all" to 5 = "maximal or unable to do because of breathlessness")  Scoring Scores range from 0 to 120.  Minimally important difference is 5 units  CAT: CAT can identify the health impairment of COPD patients and is better correlated with disease progression.  CAT has a scoring range of zero to 40. The CAT score is classified into four groups of low (less than 10), medium (10 - 20), high (21-30) and very high (31-40) based on the impact level of disease on health status. A CAT score over  10 suggests significant symptoms.  A worsening CAT score could be explained by an exacerbation, poor medication adherence, poor inhaler technique, or progression of COPD or comorbid conditions.  CAT MCID is 2 points  mMRC: mMRC (Modified Medical Research Council) Dyspnea Scale is used to assess the degree of baseline functional disability in patients of respiratory disease due to dyspnea. No minimal important difference is established. A decrease in score of 1 point or greater is considered a positive change.   Pulmonary Function Assessment: Pulmonary Function Assessment - 05/28/19 1151      Pulmonary Function Tests   FVC%  92 %    FEV1%  91 %    FEV1/FVC Ratio  99    DLCO%  48 %      Initial Spirometry Results   FVC%  92 %    FEV1%  91 %    FEV1/FVC Ratio  99      Post Bronchodilator Spirometry Results   FVC%  91 %    FEV1%  88 %    FEV1/FVC Ratio  97      Breath   Bilateral Breath Sounds  Clear    Shortness of Breath  Yes        Exercise Target Goals: Exercise Program Goal: Individual exercise prescription set using results from initial 6 min walk test and THRR while considering  patient's activity barriers and safety.   Exercise Prescription Goal: Initial exercise prescription builds to 30-45 minutes a day of aerobic activity, 2-3 days per week.  Home exercise guidelines will be given to patient during program as part of exercise prescription that the participant will acknowledge.  Activity Barriers & Risk Stratification: Activity Barriers & Cardiac Risk Stratification - 05/28/19 1124      Activity Barriers & Cardiac Risk Stratification   Activity Barriers  Shortness of Breath;Other (comment)   knee surgeryx4, plantar fascitis (L) foot   Cardiac Risk Stratification  Low       6 Minute Walk: 6 Minute Walk    Row Name 05/28/19 1121         6 Minute Walk   Phase  Initial     Distance  1400 feet     Walk Time  6 minutes     # of Rest Breaks  0     MPH  2.65     METS  3.03     RPE  14     Perceived Dyspnea   16     VO2 Peak  13.49     Symptoms  Yes (comment)     Comments  SOB     Resting HR  66 bpm     Resting BP  120/80     Resting Oxygen Saturation   97 %     Exercise Oxygen Saturation  during 6 min walk  94 %     Max Ex. HR  99 bpm     Max Ex. BP  124/84     2 Minute Post BP  120/80        Oxygen Initial Assessment: Oxygen Initial Assessment - 05/28/19 1149      Home Oxygen   Home Oxygen Device  None    Sleep Oxygen Prescription  None    Home Exercise Oxygen Prescription  None    Home at Rest Exercise Oxygen Prescription  None      Initial 6 min Walk   Oxygen Used  None      Program Oxygen Prescription  Program Oxygen Prescription  None      Intervention   Short Term Goals  To learn and understand importance of monitoring SPO2 with pulse oximeter and demonstrate accurate use of the pulse oximeter.;To learn and understand importance of maintaining oxygen saturations>88%;To  learn and demonstrate proper pursed lip breathing techniques or other breathing techniques.    Long  Term Goals  Verbalizes importance of monitoring SPO2 with pulse oximeter and return demonstration;Maintenance of O2 saturations>88%;Exhibits proper breathing techniques, such as pursed lip breathing or other method taught during program session       Oxygen Re-Evaluation: Oxygen Re-Evaluation    Row Name 06/24/19 1256 07/16/19 0757           Program Oxygen Prescription   Program Oxygen Prescription  None  None        Home Oxygen   Home Oxygen Device  None  None      Sleep Oxygen Prescription  None  None      Home Exercise Oxygen Prescription  None  None      Home at Rest Exercise Oxygen Prescription  None  None      Compliance with Home Oxygen Use  --  Yes        Goals/Expected Outcomes   Short Term Goals  To learn and understand importance of monitoring SPO2 with pulse oximeter and demonstrate accurate use of the pulse oximeter.;To learn and understand importance of maintaining oxygen saturations>88%;To learn and demonstrate proper pursed lip breathing techniques or other breathing techniques.  To learn and understand importance of monitoring SPO2 with pulse oximeter and demonstrate accurate use of the pulse oximeter.;To learn and understand importance of maintaining oxygen saturations>88%;To learn and demonstrate proper pursed lip breathing techniques or other breathing techniques.      Long  Term Goals  Verbalizes importance of monitoring SPO2 with pulse oximeter and return demonstration;Maintenance of O2 saturations>88%;Exhibits proper breathing techniques, such as pursed lip breathing or other method taught during program session  Verbalizes importance of monitoring SPO2 with pulse oximeter and return demonstration;Maintenance of O2 saturations>88%;Exhibits proper breathing techniques, such as pursed lip breathing or other method taught during program session      Comments  Patiient  is ablet to verbalize the importance of maintaining his O2 saturation >88% and the importance of monitoring his O2 saturation. He exhibits proper pursed lip breathing techniques in class.  Patiient is ablet to verbalize the importance of maintaining his O2 saturation >88% and the importance of monitoring his O2 saturation and is albe to properly demonstrate usage of pulse oximeter. He exhibits proper pursed lip breathing techniques in class.      Goals/Expected Outcomes  Patient will continue to meet both his short and long term goals.  Patient will continue to meet both his short and long term goals.         Oxygen Discharge (Final Oxygen Re-Evaluation): Oxygen Re-Evaluation - 07/16/19 0757      Program Oxygen Prescription   Program Oxygen Prescription  None      Home Oxygen   Home Oxygen Device  None    Sleep Oxygen Prescription  None    Home Exercise Oxygen Prescription  None    Home at Rest Exercise Oxygen Prescription  None    Compliance with Home Oxygen Use  Yes      Goals/Expected Outcomes   Short Term Goals  To learn and understand importance of monitoring SPO2 with pulse oximeter and demonstrate accurate use of the pulse oximeter.;To learn  and understand importance of maintaining oxygen saturations>88%;To learn and demonstrate proper pursed lip breathing techniques or other breathing techniques.    Long  Term Goals  Verbalizes importance of monitoring SPO2 with pulse oximeter and return demonstration;Maintenance of O2 saturations>88%;Exhibits proper breathing techniques, such as pursed lip breathing or other method taught during program session    Comments  Patiient is ablet to verbalize the importance of maintaining his O2 saturation >88% and the importance of monitoring his O2 saturation and is albe to properly demonstrate usage of pulse oximeter. He exhibits proper pursed lip breathing techniques in class.    Goals/Expected Outcomes  Patient will continue to meet both his short and  long term goals.       Initial Exercise Prescription: Initial Exercise Prescription - 05/28/19 1100      Date of Initial Exercise RX and Referring Provider   Date  05/28/19    Referring Provider  Dr. Lamonte Sakai    Expected Discharge Date  08/25/19      Treadmill   MPH  1.3    Grade  0    Minutes  17    METs  1.99      Recumbant Elliptical   Level  1    RPM  45    Watts  64    Minutes  20    METs  3.5      Prescription Details   Frequency (times per week)  2    Duration  Progress to 30 minutes of continuous aerobic without signs/symptoms of physical distress      Intensity   THRR 40-80% of Max Heartrate  [phone]    Ratings of Perceived Exertion  11-13    Perceived Dyspnea  0-4      Progression   Progression  Continue to progress workloads to maintain intensity without signs/symptoms of physical distress.      Resistance Training   Training Prescription  Yes    Weight  1    Reps  10-15       Perform Capillary Blood Glucose checks as needed.  Exercise Prescription Changes:  Exercise Prescription Changes    Row Name 06/14/19 1500 06/23/19 1400 07/15/19 1500         Response to Exercise   Blood Pressure (Admit)  142/88  148/82  110/60     Blood Pressure (Exercise)  148/82  138/80  118/70     Blood Pressure (Exit)  118/72  104/70  100/60     Heart Rate (Admit)  82 bpm  86 bpm  55 bpm     Heart Rate (Exercise)  105 bpm  118 bpm  83 bpm     Heart Rate (Exit)  86 bpm  81 bpm  63 bpm     Oxygen Saturation (Admit)  98 %  97 %  96 %     Oxygen Saturation (Exercise)  95 %  95 %  95 %     Oxygen Saturation (Exit)  97 %  97 %  95 %     Rating of Perceived Exertion (Exercise)  12  12  12      Perceived Dyspnea (Exercise)  13  11  12      Symptoms  SOB  SOB  SOB     Comments  has not been 2wks  --  --     Duration  Continue with 30 min of aerobic exercise without signs/symptoms of physical distress.  Continue with 30 min of aerobic exercise without  signs/symptoms of  physical distress.  Continue with 30 min of aerobic exercise without signs/symptoms of physical distress.     Intensity  THRR unchanged  THRR unchanged  THRR New       Progression   Progression  Continue to progress workloads to maintain intensity without signs/symptoms of physical distress.  Continue to progress workloads to maintain intensity without signs/symptoms of physical distress.  Continue to progress workloads to maintain intensity without signs/symptoms of physical distress.     Average METs  2.64  2.54  2.73       Resistance Training   Training Prescription  Yes  Yes  Yes     Weight  1  2  2      Reps  10-15  10-15  10-15     Time  10 Minutes  10 Minutes  10 Minutes       Oxygen   Liters  0  0  0       Treadmill   MPH  1.3  1.4  1.5     Grade  0  0  0     Minutes  17  17  17      METs  1.99  2.07  2.07       Recumbant Elliptical   Level  1  1  1      RPM  50  47  49     Watts  60  57  60     Minutes  22  22  22      METs  3.3  3.1  3.4       Home Exercise Plan   Plans to continue exercise at  Home (comment)  Home (comment)  Home (comment)     Frequency  Add 3 additional days to program exercise sessions.  Add 3 additional days to program exercise sessions.  Add 3 additional days to program exercise sessions.     Initial Home Exercises Provided  05/28/19  05/28/19  05/28/19        Exercise Comments:  Exercise Comments    Row Name 06/14/19 1520 06/23/19 1528 07/15/19 1552       Exercise Comments  Patient is doing well for such a short period of time. Will continue to progress his workloads.  Patient is doing well with his first 2 week. Will continue to progress as tolerated.  We will continue to progress as tolerated.        Exercise Goals and Review:  Exercise Goals    Row Name 05/28/19 1141             Exercise Goals   Increase Physical Activity  Yes       Intervention  Provide advice, education, support and counseling about physical activity/exercise  needs.;Develop an individualized exercise prescription for aerobic and resistive training based on initial evaluation findings, risk stratification, comorbidities and participant's personal goals.       Expected Outcomes  Short Term: Attend rehab on a regular basis to increase amount of physical activity.;Long Term: Add in home exercise to make exercise part of routine and to increase amount of physical activity.       Increase Strength and Stamina  Yes       Intervention  Provide advice, education, support and counseling about physical activity/exercise needs.;Develop an individualized exercise prescription for aerobic and resistive training based on initial evaluation findings, risk stratification, comorbidities and participant's personal goals.       Expected Outcomes  Short Term: Perform resistance training exercises routinely during rehab and add in resistance training at home;Long Term: Improve cardiorespiratory fitness, muscular endurance and strength as measured by increased METs and functional capacity (6MWT)       Able to understand and use rate of perceived exertion (RPE) scale  Yes       Intervention  Provide education and explanation on how to use RPE scale       Expected Outcomes  Short Term: Able to use RPE daily in rehab to express subjective intensity level;Long Term:  Able to use RPE to guide intensity level when exercising independently       Able to understand and use Dyspnea scale  Yes       Intervention  Provide education and explanation on how to use Dyspnea scale       Expected Outcomes  Short Term: Able to use Dyspnea scale daily in rehab to express subjective sense of shortness of breath during exertion;Long Term: Able to use Dyspnea scale to guide intensity level when exercising independently       Knowledge and understanding of Target Heart Rate Range (THRR)  Yes       Intervention  Provide education and explanation of THRR including how the numbers were predicted and where  they are located for reference       Expected Outcomes  Short Term: Able to use daily as guideline for intensity in rehab;Long Term: Able to use THRR to govern intensity when exercising independently       Able to check pulse independently  Yes       Intervention  Provide education and demonstration on how to check pulse in carotid and radial arteries.;Review the importance of being able to check your own pulse for safety during independent exercise       Expected Outcomes  Short Term: Able to explain why pulse checking is important during independent exercise;Long Term: Able to check pulse independently and accurately       Understanding of Exercise Prescription  Yes       Intervention  Provide education, explanation, and written materials on patient's individual exercise prescription       Expected Outcomes  Short Term: Able to explain program exercise prescription;Long Term: Able to explain home exercise prescription to exercise independently          Exercise Goals Re-Evaluation : Exercise Goals Re-Evaluation    Row Name 06/14/19 1516 06/23/19 1525 07/15/19 1549         Exercise Goal Re-Evaluation   Exercise Goals Review  Increase Physical Activity;Increase Strength and Stamina  Increase Physical Activity;Increase Strength and Stamina  Increase Physical Activity;Increase Strength and Stamina     Comments  Patient has just started program. This is his 4th session. He is already making progress with walking on the TM and using the seated ellipitcal. We will progress his workload in the next 2 weeks.  Patient has done well. This is his first progression. Will monitor how he tolerates it and his level of SOB. He has improved walking on the TM. He can now do the entire 17 minutes without stopping. He tolerates the seated elliptical well.  Patient is progressing slow but steady. He is still SOB and has a persistent cough. He has a doctors appointment schduled with his pulmonologist for next week.  His goals are to decrease his SOB, and become active.     Expected Outcomes  His main goals are to reduce his SOB and  to become more active. We will monitor his this and ask patient if he feels he is reaching his expected goals.  Reach in expected goals of decreasing his SOB, being able to become more active.  To reach his personal goals        Discharge Exercise Prescription (Final Exercise Prescription Changes): Exercise Prescription Changes - 07/15/19 1500      Response to Exercise   Blood Pressure (Admit)  110/60    Blood Pressure (Exercise)  118/70    Blood Pressure (Exit)  100/60    Heart Rate (Admit)  55 bpm    Heart Rate (Exercise)  83 bpm    Heart Rate (Exit)  63 bpm    Oxygen Saturation (Admit)  96 %    Oxygen Saturation (Exercise)  95 %    Oxygen Saturation (Exit)  95 %    Rating of Perceived Exertion (Exercise)  12    Perceived Dyspnea (Exercise)  12    Symptoms  SOB    Duration  Continue with 30 min of aerobic exercise without signs/symptoms of physical distress.    Intensity  THRR New      Progression   Progression  Continue to progress workloads to maintain intensity without signs/symptoms of physical distress.    Average METs  2.73      Resistance Training   Training Prescription  Yes    Weight  2    Reps  10-15    Time  10 Minutes      Oxygen   Liters  0      Treadmill   MPH  1.5    Grade  0    Minutes  17    METs  2.07      Recumbant Elliptical   Level  1    RPM  49    Watts  60    Minutes  22    METs  3.4      Home Exercise Plan   Plans to continue exercise at  Home (comment)    Frequency  Add 3 additional days to program exercise sessions.    Initial Home Exercises Provided  05/28/19       Nutrition:  Target Goals: Understanding of nutrition guidelines, daily intake of sodium 1500mg , cholesterol 200mg , calories 30% from fat and 7% or less from saturated fats, daily to have 5 or more servings of fruits and vegetables.  Biometrics: Pre  Biometrics - 05/28/19 1144      Pre Biometrics   Height  5\' 10"  (1.778 m)    Weight  85.6 kg    Waist Circumference  38 inches    Hip Circumference  36 inches    Waist to Hip Ratio  1.06 %    BMI (Calculated)  27.08    Triceps Skinfold  8 mm    % Body Fat  23.2 %    Grip Strength  31.6 kg    Flexibility  0 in    Single Leg Stand  60 seconds        Nutrition Therapy Plan and Nutrition Goals: Nutrition Therapy & Goals - 07/16/19 0758      Personal Nutrition Goals   Comments  We continue to work on scheduling patients for the RD class. Patient scored 66 on his medificts diet assessment score. We have provided handouts for education. Will continue to monitor for progress.      Intervention Plan   Intervention  Nutrition handout(s) given to patient.  Nutrition Assessments: Nutrition Assessments - 05/28/19 1158      MEDFICTS Scores   Pre Score  66       Nutrition Goals Re-Evaluation:   Nutrition Goals Discharge (Final Nutrition Goals Re-Evaluation):   Psychosocial: Target Goals: Acknowledge presence or absence of significant depression and/or stress, maximize coping skills, provide positive support system. Participant is able to verbalize types and ability to use techniques and skills needed for reducing stress and depression.  Initial Review & Psychosocial Screening: Initial Psych Review & Screening - 05/28/19 1154      Initial Review   Current issues with  None Identified      Family Dynamics   Good Support System?  Yes    Concerns  No support system      Barriers   Psychosocial barriers to participate in program  There are no identifiable barriers or psychosocial needs.      Screening Interventions   Interventions  Encouraged to exercise    Expected Outcomes  Short Term goal: Identification and review with participant of any Quality of Life or Depression concerns found by scoring the questionnaire.;Long Term goal: The participant improves quality of Life  and PHQ9 Scores as seen by post scores and/or verbalization of changes       Quality of Life Scores: Quality of Life - 05/28/19 1155      Quality of Life Scores   Health/Function Pre  12.38 %    Socioeconomic Pre  30 %    Psych/Spiritual Pre  27.43 %    Family Pre  24 %    GLOBAL Pre  20.29 %      Scores of 19 and below usually indicate a poorer quality of life in these areas.  A difference of  2-3 points is a clinically meaningful difference.  A difference of 2-3 points in the total score of the Quality of Life Index has been associated with significant improvement in overall quality of life, self-image, physical symptoms, and general health in studies assessing change in quality of life.   PHQ-9: Recent Review Flowsheet Data    Depression screen [facility] 2/9 05/28/2019   Decreased Interest 0   Down, Depressed, Hopeless 0   PHQ - 2 Score 0   Altered sleeping 3   Tired, decreased energy 2   Change in appetite 0   Feeling bad or failure about yourself  0   Trouble concentrating 0   Moving slowly or fidgety/restless 0   Suicidal thoughts 0   PHQ-9 Score 5   Difficult doing work/chores Very difficult     Interpretation of Total Score  Total Score Depression Severity:  1-4 = Minimal depression, 5-9 = Mild depression, 10-14 = Moderate depression, 15-19 = Moderately severe depression, 20-27 = Severe depression   Psychosocial Evaluation and Intervention: Psychosocial Evaluation - 05/28/19 1155      Psychosocial Evaluation & Interventions   Interventions  Encouraged to exercise with the program and follow exercise prescription    Continue Psychosocial Services   No Follow up required       Psychosocial Re-Evaluation: Psychosocial Re-Evaluation    [place] Name 06/24/19 1300 07/16/19 0803           Psychosocial Re-Evaluation   Current issues with  None Identified  None Identified      Comments  Patient's initial QOL was 20.29 and PHQ-9 was 5. He continues to take Valium 10 mg  daily. He has no psychosocial issues identified.  Patient's initial QOL was 20.29  and PHQ-9 was 5. He continues to take Valium 10 mg daily. He has no psychosocial issues identified.      Expected Outcomes  Patient will have no psychosocial issues identified at discharge.  Patient will have no psychosocial issues identified at discharge.      Interventions  Encouraged to attend Pulmonary Rehabilitation for the exercise;Relaxation education;Stress management education  Encouraged to attend Pulmonary Rehabilitation for the exercise;Relaxation education;Stress management education      Continue Psychosocial Services   No Follow up required  No Follow up required         Psychosocial Discharge (Final Psychosocial Re-Evaluation): Psychosocial Re-Evaluation - 07/16/19 0803      Psychosocial Re-Evaluation   Current issues with  None Identified    Comments  Patient's initial QOL was 20.29 and PHQ-9 was 5. He continues to take Valium 10 mg daily. He has no psychosocial issues identified.    Expected Outcomes  Patient will have no psychosocial issues identified at discharge.    Interventions  Encouraged to attend Pulmonary Rehabilitation for the exercise;Relaxation education;Stress management education    Continue Psychosocial Services   No Follow up required        Education: Education Goals: Education classes will be provided on a weekly basis, covering required topics. Participant will state understanding/return demonstration of topics presented.  Learning Barriers/Preferences: Learning Barriers/Preferences - 05/28/19 1159      Learning Barriers/Preferences   Learning Barriers  None    Learning Preferences  Group Instruction;Skilled Demonstration       Education Topics: How Lungs Work and Diseases: - Discuss the anatomy of the lungs and diseases that can affect the lungs, such as COPD.   Exercise: -Discuss the importance of exercise, FITT principles of exercise, normal and abnormal  responses to exercise, and how to exercise safely.   Environmental Irritants: -Discuss types of environmental irritants and how to limit exposure to environmental irritants.   Meds/Inhalers and oxygen: - Discuss respiratory medications, definition of an inhaler and oxygen, and the proper way to use an inhaler and oxygen.   Energy Saving Techniques: - Discuss methods to conserve energy and decrease shortness of breath when performing activities of daily living.    PULMONARY REHAB OTHER RESPIRATORY from 07/15/2019 in [place]  Date  07/15/19  Educator  Etheleen Mayhew  Instruction Review Code  2- Demonstrated Understanding      Bronchial Hygiene / Breathing Techniques: - Discuss breathing mechanics, pursed-lip breathing technique,  proper posture, effective ways to clear airways, and other functional breathing techniques   Cleaning Equipment: - Provides group verbal and written instruction about the health risks of elevated stress, cause of high stress, and healthy ways to reduce stress.   Nutrition I: Fats: - Discuss the types of cholesterol, what cholesterol does to the body, and how cholesterol levels can be controlled.   Nutrition II: Labels: -Discuss the different components of food labels and how to read food labels.   PULMONARY REHAB OTHER RESPIRATORY from 07/15/2019 in [place]  Date  06/17/19  Educator  DWynetta Emery  Instruction Review Code  2- Demonstrated Understanding      Respiratory Infections: - Discuss the signs and symptoms of respiratory infections, ways to prevent respiratory infections, and the importance of seeking medical treatment when having a respiratory infection.   Stress I: Signs and Symptoms: - Discuss the causes of stress, how stress may lead to anxiety and depression, and ways to limit stress.   PULMONARY REHAB  OTHER RESPIRATORY from 07/15/2019 in [place]  Date  07/01/19   Educator  D.Musette Kisamore  Instruction Review Code  2- Demonstrated Understanding      Stress II: Relaxation: -Discuss relaxation techniques to limit stress.   PULMONARY REHAB OTHER RESPIRATORY from 07/15/2019 in [place]  Date  06/24/19  Educator  DC  Instruction Review Code  2- Demonstrated Understanding      Oxygen for Home/Travel: - Discuss how to prepare for travel when on oxygen and proper ways to transport and store oxygen to ensure safety.   PULMONARY REHAB OTHER RESPIRATORY from 07/15/2019 in [place]  Date  07/15/19  Educator  Etheleen Mayhew  Instruction Review Code  2- Demonstrated Understanding      Knowledge Questionnaire Score: Knowledge Questionnaire Score - 05/28/19 1204      Knowledge Questionnaire Score   Pre Score  16/18       Core Components/Risk Factors/Patient Goals at Admission: Personal Goals and Risk Factors at Admission - 05/28/19 1204      Core Components/Risk Factors/Patient Goals on Admission    Weight Management  Weight Maintenance    Personal Goal Other  Yes    Personal Goal  Help with SOB, Be more active    Intervention  Attend PR 2 x week and supplement with 3 x week of TM waling and stationary bike riding at home.    Expected Outcomes  Reach goals       Core Components/Risk Factors/Patient Goals Review:  Goals and Risk Factor Review    Row Name 06/24/19 1259 07/16/19 0758           Core Components/Risk Factors/Patient Goals Review   Personal Goals Review  Weight Management/Obesity;Improve shortness of breath with ADL's Decrease SOB; be more active.  Weight Management/Obesity;Improve shortness of breath with ADL's Decrease SOB; be more active.      Review  Patient has completed 7 sessions gaining 6 lbs since his orientation visit. He is doing well in the program with some progressions. He says he has not noticed an improvement in his SOB yet but is enjoying the program. Will continue to  monitor for progress.  Patient has completed 14 sessions gaining 1 lb since last 30 day review. He continues to do well in the program with progression. He has recently been having more congestion and coughing up more phlegm. His pulmonologist retired and he has finally found a MD to go to. He has an appointment in April. He does feel like he is making progress with getting stronger but his SOB has not changed. He still howerer, feels the program is a benefit and hopes his new MD will have some new treatment options. Will continue to  monitor for progress.      Expected Outcomes  Patient will continue to attend sessions and complete the program meeting his personal goals.  Patient will continue to attend sessions and complete the program meeting his personal goals.         Core Components/Risk Factors/Patient Goals at Discharge (Final Review):  Goals and Risk Factor Review - 07/16/19 0758      Core Components/Risk Factors/Patient Goals Review   Personal Goals Review  Weight Management/Obesity;Improve shortness of breath with ADL's   Decrease SOB; be more active.   Review  Patient has completed 14 sessions gaining 1 lb since last 30 day review. He continues to do well in the program with progression. He has recently been having more congestion  and coughing up more phlegm. His pulmonologist retired and he has finally found a MD to go to. He has an appointment in April. He does feel like he is making progress with getting stronger but his SOB has not changed. He still howerer, feels the program is a benefit and hopes his new MD will have some new treatment options. Will continue to  monitor for progress.    Expected Outcomes  Patient will continue to attend sessions and complete the program meeting his personal goals.       ITP Comments: ITP Comments    Row Name 05/28/19 1117           ITP Comments  Patient is eager to get started.          Comments: ITP REVIEW Pt is making expected progress  toward pulmonary rehab goals after completing 14 sessions. Recommend continued exercise, life style modification, education, and utilization of breathing techniques to increase stamina and strength and decrease shortness of breath with exertion.

## 2019-07-20 ENCOUNTER — Other Ambulatory Visit: Payer: Self-pay

## 2019-07-20 ENCOUNTER — Encounter (HOSPITAL_COMMUNITY)
Admission: RE | Admit: 2019-07-20 | Discharge: 2019-07-20 | Disposition: A | Discharge status: Home / Self Care | Payer: BC Managed Care – PPO | Source: Ambulatory Visit | Attending: Emergency Medicine | Admitting: Emergency Medicine

## 2019-07-20 DIAGNOSIS — J439 Emphysema, unspecified: Secondary | ICD-10-CM | POA: Diagnosis not present

## 2019-07-20 NOTE — Progress Notes (Signed)
Daily Session Note  Patient Details  Name: Peter Mahoney MRN: [id number] Date of Birth: Jul 14, 1960 Referring Provider:     PULMONARY REHAB OTHER RESP ORIENTATION from 05/28/2019 in [place]  Referring Provider  Dr. Lamonte Sakai      Encounter Date: 07/20/2019  Check In: Session Check In - 07/20/19 1045      Check-In   Supervising physician immediately available to respond to emergencies  See telemetry face sheet for immediately available MD    Location  [facility]    Staff Present  Russella Dar, MS, EP, [facility], Exercise Physiologist;Other    Virtual Visit  No    Medication changes reported      No    Fall or balance concerns reported     No    Tobacco Cessation  No Change    Warm-up and Cool-down  Performed as group-led instruction    Resistance Training Performed  Yes    VAD Patient?  No    PAD/SET Patient?  No      Pain Assessment   Currently in Pain?  No/denies    Pain Score  0-No pain    Multiple Pain Sites  No       Capillary Blood Glucose: No results found for this or any previous visit (from the past 24 hour(s)).    Social History   Tobacco Use  Smoking Status Former Smoker  . Packs/day: 1.00  . Years: 35.00  . Pack years: 35.00  . Types: Cigarettes  . Quit date: 11/01/2015  . Years since quitting: 3.7  Smokeless Tobacco Never Used    Goals Met:  Proper associated with RPD/PD & O2 Sat Independence with exercise equipment Improved SOB with ADL's Using PLB without cueing & demonstrates good technique Exercise tolerated well Personal goals reviewed No report of cardiac concerns or symptoms Strength training completed today  Goals Unmet:  Not Applicable  Comments: check out 11:45   Dr. Kate Sable is Medical Director for [facility] and Pulmonary Rehab.

## 2019-07-22 ENCOUNTER — Encounter (HOSPITAL_COMMUNITY)
Admission: RE | Admit: 2019-07-22 | Discharge: 2019-07-22 | Disposition: A | Discharge status: Home / Self Care | Payer: BC Managed Care – PPO | Source: Ambulatory Visit | Attending: Emergency Medicine | Admitting: Emergency Medicine

## 2019-07-22 ENCOUNTER — Other Ambulatory Visit: Payer: Self-pay

## 2019-07-22 DIAGNOSIS — J439 Emphysema, unspecified: Secondary | ICD-10-CM | POA: Insufficient documentation

## 2019-07-22 NOTE — Progress Notes (Signed)
Daily Session Note  Patient Details  Name: Peter Mahoney MRN: [id number] Date of Birth: 10/12/1960 Referring Provider:     PULMONARY REHAB OTHER RESP ORIENTATION from 05/28/2019 in [place] [facility]  Referring Provider  Dr. Byrum      Encounter Date: 07/22/2019  Check In: Session Check In - 07/22/19 1045      Check-In   Supervising physician immediately available to respond to emergencies  See telemetry face sheet for immediately available MD    Location  [facility]    Staff Present  Diane Coad, MS, EP, [facility], Exercise Physiologist;Other    Virtual Visit  No    Medication changes reported      No    Fall or balance concerns reported     No    Tobacco Cessation  No Change    Warm-up and Cool-down  Performed as group-led instruction    Resistance Training Performed  Yes    VAD Patient?  No    PAD/SET Patient?  No      Pain Assessment   Currently in Pain?  No/denies    Pain Score  0-No pain    Multiple Pain Sites  No       Capillary Blood Glucose: No results found for this or any previous visit (from the past 24 hour(s)).    Social History   Tobacco Use  Smoking Status Former Smoker  . Packs/day: 1.00  . Years: 35.00  . Pack years: 35.00  . Types: Cigarettes  . Quit date: 11/01/2015  . Years since quitting: 3.7  Smokeless Tobacco Never Used    Goals Met:  Proper associated with RPD/PD & O2 Sat Independence with exercise equipment Improved SOB with ADL's Using PLB without cueing & demonstrates good technique Exercise tolerated well No report of cardiac concerns or symptoms Strength training completed today  Goals Unmet:  Not Applicable  Comments: Check out 1145.   Dr. Suresh Koneswaran is Medical Director for [facility]. 

## 2019-07-27 ENCOUNTER — Other Ambulatory Visit: Payer: Self-pay

## 2019-07-27 ENCOUNTER — Encounter (HOSPITAL_COMMUNITY)
Admission: RE | Admit: 2019-07-27 | Discharge: 2019-07-27 | Disposition: A | Discharge status: Home / Self Care | Payer: BC Managed Care – PPO | Source: Ambulatory Visit | Attending: Emergency Medicine | Admitting: Emergency Medicine

## 2019-07-27 DIAGNOSIS — J439 Emphysema, unspecified: Secondary | ICD-10-CM | POA: Diagnosis not present

## 2019-07-27 NOTE — Progress Notes (Signed)
Daily Session Note  Patient Details  Name: Peter Mahoney MRN: [id number] Date of Birth: December 09, 1960 Referring Provider:     PULMONARY REHAB OTHER RESP ORIENTATION from 05/28/2019 in [place]  Referring Provider  Dr. Lamonte Sakai      Encounter Date: 07/27/2019  Check In: Session Check In - 07/27/19 1045      Check-In   Supervising physician immediately available to respond to emergencies  See telemetry face sheet for immediately available MD    Location  [facility]    Staff Present  Russella Dar, MS, EP, [facility], Exercise Physiologist;Other    Virtual Visit  No    Medication changes reported      No    Fall or balance concerns reported     No    Tobacco Cessation  No Change    Warm-up and Cool-down  Performed as group-led instruction    Resistance Training Performed  Yes    VAD Patient?  No    PAD/SET Patient?  No      Pain Assessment   Currently in Pain?  No/denies    Pain Score  0-No pain    Multiple Pain Sites  No       Capillary Blood Glucose: No results found for this or any previous visit (from the past 24 hour(s)).    Social History   Tobacco Use  Smoking Status Former Smoker  . Packs/day: 1.00  . Years: 35.00  . Pack years: 35.00  . Types: Cigarettes  . Quit date: 11/01/2015  . Years since quitting: 3.7  Smokeless Tobacco Never Used    Goals Met:  Proper associated with RPD/PD & O2 Sat Independence with exercise equipment Improved SOB with ADL's Using PLB without cueing & demonstrates good technique Exercise tolerated well Personal goals reviewed No report of cardiac concerns or symptoms Strength training completed today  Goals Unmet:  Not Applicable  Comments: check out 11:45   Dr. Kate Sable is Medical Director for [facility] and Pulmonary Rehab.

## 2019-07-28 ENCOUNTER — Ambulatory Visit (INDEPENDENT_AMBULATORY_CARE_PROVIDER_SITE_OTHER): Payer: BC Managed Care – PPO | Admitting: Emergency Medicine

## 2019-07-28 ENCOUNTER — Encounter: Payer: Self-pay | Admitting: Emergency Medicine

## 2019-07-28 VITALS — BP 124/86 | HR 68 | Ht 70.5 in | Wt 191.0 lb

## 2019-07-28 DIAGNOSIS — J431 Panlobular emphysema: Secondary | ICD-10-CM | POA: Diagnosis not present

## 2019-07-28 DIAGNOSIS — Z122 Encounter for screening for malignant neoplasm of respiratory organs: Secondary | ICD-10-CM | POA: Diagnosis not present

## 2019-07-28 DIAGNOSIS — F17210 Nicotine dependence, cigarettes, uncomplicated: Secondary | ICD-10-CM | POA: Diagnosis not present

## 2019-07-28 DIAGNOSIS — J439 Emphysema, unspecified: Secondary | ICD-10-CM | POA: Insufficient documentation

## 2019-07-28 DIAGNOSIS — R911 Solitary pulmonary nodule: Secondary | ICD-10-CM

## 2019-07-28 DIAGNOSIS — Z87891 Personal history of nicotine dependence: Secondary | ICD-10-CM

## 2019-07-28 MED ORDER — SPIRIVA RESPIMAT 2.5 MCG/ACT IN AERS: 2.0000 | INHALATION_SPRAY | Freq: Every day | RESPIRATORY_TRACT | 0 refills | Status: DC

## 2019-07-28 MED ORDER — ALBUTEROL SULFATE HFA 108 (90 BASE) MCG/ACT IN AERS: 2.0000 | INHALATION_SPRAY | RESPIRATORY_TRACT | 5 refills | Status: DC | PRN

## 2019-07-28 NOTE — Progress Notes (Signed)
Subjective:    Patient ID: Peter Mahoney, male    DOB: 1960-07-01, 59 y.o.   MRN: [id number]  HPI 59 year old former smoker (45 pack years) who has been followed by Dr. Luan Pulling for COPD.  Also with pulmonary nodular disease for which he underwent serial CT scans of the chest 10/30/2015 through 08/05/2017, stable.  Quantitative alpha-1 antitrypsin done 05/30/2017 was 161 (normal), no genotype tested.  Most recent pulmonary function testing reviewed by me from 05/20/2018 shows normal airflows and volumes but curve to his flow volume loop.  He also has a significant defect in diffusion capacity.  He is doing Pul[facility] at [facility]. Has not had any desaturations.   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   He has exertional SOB, also sometimes at rest. He has tried Trelegy, Anoro without effect. He has albuterol to use prn. Uses rarely and has had minimal effect. He has some daily cough, has phlegm, clear, happens every day. Last prednisone was a year ago.   Due for LDCT in June 2021, last scan was RADS 2   Review of Systems  Past Medical History:  Diagnosis Date  . Dental crown present   . GERD (gastroesophageal reflux disease)   . Post-operative infection 09/2011   left leg  . Seasonal allergies   . Wound infection after surgery, left lateral leg 10/08/2011     Family History  Problem Relation Age of Onset  . Heart Problems Mother   . Stroke Mother   . Pulmonary fibrosis Mother   . Cancer - Lung Father        smoked  . Heart failure Maternal Grandmother   . Emphysema Maternal Grandfather        "his whole family had Emphysema"  . Diabetes Paternal Grandmother   . Cancer Paternal Grandfather      Social History   Socioeconomic History  . Marital status: Married    Spouse name: [name]  . Number of children: 2  . Years of education: 7  . Highest education level: Not on file  Occupational History    Employer: [employer]    Comment: [employer]  Tobacco  Use  . Smoking status: Former Smoker    Packs/day: 1.00    Years: 35.00    Pack years: 35.00    Types: Cigarettes    Quit date: 11/01/2015    Years since quitting: 3.7  . Smokeless tobacco: Never Used  Substance and Sexual Activity  . Alcohol use: Yes    Alcohol/week: 2.0 standard drinks    Types: 2 Standard drinks or equivalent per week    Comment: occasionally  . Drug use: No  . Sexual activity: Not on file  Other Topics Concern  . Not on file  Social History Narrative   Married father of 2, Grandfather of 3. Wife's name is [name].   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   Currently smokes a pack a day, and drinks maybe 2-3 alcohol beverages a week.         Epworth Sleepiness Scale      Score: 9      --I seem to be losing my sex drive   --I feel stressed and lack motivation   --I have COPD   --I have been told that I snore   --I am overweight or am gaining weight   --I  awake feeling not rested   Social Determinants of Health   Financial Resource Strain:   . Difficulty of Paying Living Expenses:   Food Insecurity:   . Worried About Charity fundraiser in the Last Year:   . Arboriculturist in the Last Year:   Transportation Needs:   . Film/video editor (Medical):   Marland Kitchen Lack of Transportation (Non-Medical):   Physical Activity:   . Days of Exercise per Week:   . Minutes of Exercise per Session:   Stress:   . Feeling of Stress :   Social Connections:   . Frequency of Communication with Friends and Family:   . Frequency of Social Gatherings with Friends and Family:   . Attends Religious Services:   . Active Member of Clubs or Organizations:   . Attends Archivist Meetings:   Marland Kitchen Marital Status:   Intimate Partner Violence:   . Fear of Current or Ex-Partner:   . Emotionally Abused:   Marland Kitchen Physically Abused:   . Sexually Abused:   [employer] Hotel manager  Cigarette company   Allergies    Allergen Reactions  . Dilaudid [Hydromorphone Hcl] Other (See Comments)    Urinary retention- says with most opioids taken  . Doxycycline Nausea And Vomiting  . Oxycodone      Outpatient Medications Prior to Visit  Medication Sig Dispense Refill  . diazepam (VALIUM) 10 MG tablet 10 mg at bedtime.     . RABEprazole (ACIPHEX) 20 MG tablet Take 20 mg by mouth in the morning and at bedtime.     . sildenafil (VIAGRA) 100 MG tablet Take 1 tablet (100 mg total) by mouth daily as needed for erectile dysfunction. 10 tablet 6  . tamsulosin (FLOMAX) 0.4 MG CAPS capsule Take 0.4 mg by mouth at bedtime.     Marland Kitchen ascorbic acid (VITAMIN C) 1000 MG tablet Take 500 mg by mouth daily.    . cholecalciferol (VITAMIN D3) 25 MCG (1000 UNIT) tablet Take 2,000 Units by mouth daily.    . cyanocobalamin 1000 MCG tablet Take 5,000 mcg by mouth daily.     Marland Kitchen zinc gluconate 50 MG tablet Take 50 mg by mouth daily.     No facility-administered medications prior to visit.         Objective:   Physical Exam Vitals:   07/28/19 1025  BP: 124/86  Pulse: 68  SpO2: 95%  Weight: 191 lb (86.6 kg)  Height: 5' 10.5" (1.791 m)   Gen: Pleasant, well-nourished, in no distress,  normal affect  ENT: No lesions,  mouth clear,  oropharynx clear, no postnasal drip  Neck: No JVD, no stridor  Lungs: No use of accessory muscles, distant, no crackles or wheezing on normal respiration, no wheeze on forced expiration  Cardiovascular: RRR, heart sounds normal, no murmur or gallops, no peripheral edem  Musculoskeletal: No deformities, no cyanosis or clubbing  Neuro: alert, awake, non focal  Skin: Warm, no lesions or rash       Assessment & Plan:  Pulmonary nodule Stable on serial imaging.  He now is a participant in the lung cancer screening program  Cigarette smoker Former smoker.  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  His overall airflows are intact and he has not responded significantly to bronchodilators in the past.  Plan to retry  Spiriva Respimat, see if this helps any with his dyspnea and with his secretion clearance which has become problematic.  May want to add Mucinex going forward.  He does not desaturate on exertion at pulmonary rehab.  He will complete the pulmonary rehab program.  Baltazar Apo, MD, PhD 07/28/2019, 11:06 AM [facility] [phone] or if no answer [phone]

## 2019-07-28 NOTE — Patient Instructions (Addendum)
Please complete your Pulmonary rehab as planned We will try starting Spiriva Respimat 2 puffs once daily to see if you benefit We will refill your albuterol.  Use 2 puffs if needed for shortness of breath, chest tightness, wheezing. We will plan to repeat your pulmonary function testing next year. Continue in the lung cancer screening program, next CT chest should be June 2022 Handicap parking placard paperwork completed today. Follow with Dr Lamonte Sakai in 1 month or next available to assess response to the [facility]

## 2019-07-28 NOTE — Assessment & Plan Note (Signed)
Stable on serial imaging.  He now is a participant in the lung cancer screening program

## 2019-07-28 NOTE — Assessment & Plan Note (Signed)
Former smoker.  Needs a repeat low-dose CT chest in June 2022

## 2019-07-28 NOTE — Assessment & Plan Note (Signed)
COPD with a significant emphysematous phenotype, confirmed on CT chest and by decreased DLCO on pulmonary function testing.  His overall airflows are intact and he has not responded significantly to bronchodilators in the past.  Plan to retry Spiriva Respimat, see if this helps any with his dyspnea and with his secretion clearance which has become problematic.  May want to add Mucinex going forward.  He does not desaturate on exertion at pulmonary rehab.  He will complete the pulmonary rehab program.

## 2019-07-29 ENCOUNTER — Other Ambulatory Visit: Payer: Self-pay

## 2019-07-29 ENCOUNTER — Encounter (HOSPITAL_COMMUNITY)
Admission: RE | Admit: 2019-07-29 | Discharge: 2019-07-29 | Disposition: A | Discharge status: Home / Self Care | Payer: BC Managed Care – PPO | Source: Ambulatory Visit | Attending: Emergency Medicine | Admitting: Emergency Medicine

## 2019-07-29 DIAGNOSIS — J439 Emphysema, unspecified: Secondary | ICD-10-CM

## 2019-07-29 NOTE — Progress Notes (Signed)
Daily Session Note  Patient Details  Name: Peter Mahoney MRN: [id number] Date of Birth: 05/29/1960 Referring Provider:     PULMONARY REHAB OTHER RESP ORIENTATION from 05/28/2019 in [place] [facility] RE[facility]  Referring Provider  Dr. Byrum      Encounter Date: 07/29/2019  Check In: Session Check In - 07/29/19 1045      Check-In   Supervising physician immediately available to respond to emergencies  See telemetry face sheet for immediately available MD    Location  [facility]    Staff Present  Diane Coad, MS, EP, [facility], Exercise Physiologist;Debra Johnson, RN, BSN    Virtual Visit  No    Medication changes reported      No    Fall or balance concerns reported     No    Tobacco Cessation  No Change    Warm-up and Cool-down  Performed as group-led instruction    Resistance Training Performed  No    VAD Patient?  No    PAD/SET Patient?  No      Pain Assessment   Currently in Pain?  No/denies    Pain Score  0-No pain    Multiple Pain Sites  No       Capillary Blood Glucose: No results found for this or any previous visit (from the past 24 hour(s)).    Social History   Tobacco Use  Smoking Status Former Smoker  . Packs/day: 1.00  . Years: 35.00  . Pack years: 35.00  . Types: Cigarettes  . Quit date: 11/01/2015  . Years since quitting: 3.7  Smokeless Tobacco Never Used    Goals Met:  Proper associated with RPD/PD & O2 Sat Independence with exercise equipment Improved SOB with ADL's Using PLB without cueing & demonstrates good technique Exercise tolerated well No report of cardiac concerns or symptoms Strength training completed today  Goals Unmet:  Not Applicable  Comments: Check out 1145.   Dr. Suresh Koneswaran is Medical Director for [facility]. 

## 2019-07-29 NOTE — Progress Notes (Signed)
Patient seen in the office today and instructed on use of Stiolto Respimat on 07/28/2019.  Patient expressed understanding and demonstrated technique.

## 2019-08-03 ENCOUNTER — Encounter (HOSPITAL_COMMUNITY)
Admission: RE | Admit: 2019-08-03 | Discharge: 2019-08-03 | Disposition: A | Discharge status: Home / Self Care | Payer: BC Managed Care – PPO | Source: Ambulatory Visit | Attending: Emergency Medicine | Admitting: Emergency Medicine

## 2019-08-03 ENCOUNTER — Other Ambulatory Visit: Payer: Self-pay

## 2019-08-03 DIAGNOSIS — J439 Emphysema, unspecified: Secondary | ICD-10-CM | POA: Diagnosis not present

## 2019-08-03 NOTE — Progress Notes (Signed)
Daily Session Note  Patient Details  Name: Peter Mahoney MRN: [id number] Date of Birth: Jan 05, 1961 Referring Provider:     PULMONARY REHAB OTHER RESP ORIENTATION from 05/28/2019 in [place]  Referring Provider  Dr. Lamonte Sakai      Encounter Date: 08/03/2019  Check In: Session Check In - 08/03/19 1054      Check-In   Supervising physician immediately available to respond to emergencies  See telemetry face sheet for immediately available MD    Location  [facility]    Staff Present  Russella Dar, MS, EP, [facility], Exercise Physiologist;Other    Virtual Visit  No    Medication changes reported      No    Fall or balance concerns reported     No    Tobacco Cessation  No Change    Warm-up and Cool-down  Performed as group-led instruction    Resistance Training Performed  Yes    VAD Patient?  No    PAD/SET Patient?  No      Pain Assessment   Currently in Pain?  No/denies    Pain Score  0-No pain    Multiple Pain Sites  No       Capillary Blood Glucose: No results found for this or any previous visit (from the past 24 hour(s)).    Social History   Tobacco Use  Smoking Status Former Smoker  . Packs/day: 1.00  . Years: 35.00  . Pack years: 35.00  . Types: Cigarettes  . Quit date: 11/01/2015  . Years since quitting: 3.7  Smokeless Tobacco Never Used    Goals Met:  Proper associated with RPD/PD & O2 Sat Independence with exercise equipment Improved SOB with ADL's Using PLB without cueing & demonstrates good technique Exercise tolerated well Personal goals reviewed No report of cardiac concerns or symptoms Strength training completed today  Goals Unmet:  Not Applicable  Comments: check out 11:45   Dr. Kate Sable is Medical Director for [place] and Pulmonary Rehab.

## 2019-08-05 ENCOUNTER — Encounter (HOSPITAL_COMMUNITY)
Admission: RE | Admit: 2019-08-05 | Discharge: 2019-08-05 | Disposition: A | Discharge status: Home / Self Care | Payer: BC Managed Care – PPO | Source: Ambulatory Visit | Attending: Emergency Medicine | Admitting: Emergency Medicine

## 2019-08-05 ENCOUNTER — Other Ambulatory Visit: Payer: Self-pay

## 2019-08-05 DIAGNOSIS — J439 Emphysema, unspecified: Secondary | ICD-10-CM | POA: Diagnosis not present

## 2019-08-05 NOTE — Progress Notes (Signed)
Daily Session Note  Patient Details  Name: Peter Mahoney MRN: [id number] Date of Birth: 1960/10/27 Referring Provider:     PULMONARY REHAB OTHER RESP ORIENTATION from 05/28/2019 in [place]  Referring Provider  Dr. Lamonte Sakai      Encounter Date: 08/05/2019  Check In: Session Check In - 08/05/19 1045      Check-In   Supervising physician immediately available to respond to emergencies  See telemetry face sheet for immediately available MD    Location  [facility]    Staff Present  Russella Dar, MS, EP, [facility], Exercise Physiologist;Nicolasa Milbrath Wynetta Emery, RN, BSN    Virtual Visit  No    Medication changes reported      No    Tobacco Cessation  No Change    Warm-up and Cool-down  Performed as group-led instruction    Resistance Training Performed  Yes    VAD Patient?  No    PAD/SET Patient?  No      Pain Assessment   Currently in Pain?  No/denies    Pain Score  0-No pain    Multiple Pain Sites  No       Capillary Blood Glucose: No results found for this or any previous visit (from the past 24 hour(s)).    Social History   Tobacco Use  Smoking Status Former Smoker  . Packs/day: 1.00  . Years: 35.00  . Pack years: 35.00  . Types: Cigarettes  . Quit date: 11/01/2015  . Years since quitting: 3.7  Smokeless Tobacco Never Used    Goals Met:  Proper associated with RPD/PD & O2 Sat Independence with exercise equipment Improved SOB with ADL's Using PLB without cueing & demonstrates good technique Exercise tolerated well Personal goals reviewed No report of cardiac concerns or symptoms Strength training completed today  Goals Unmet:  Not Applicable  Comments: Check out 1145.   Dr. Kate Sable is Medical Director for [facility] and [facility].

## 2019-08-10 ENCOUNTER — Other Ambulatory Visit: Payer: Self-pay

## 2019-08-10 ENCOUNTER — Encounter (HOSPITAL_COMMUNITY)
Admission: RE | Admit: 2019-08-10 | Discharge: 2019-08-10 | Disposition: A | Discharge status: Home / Self Care | Payer: BC Managed Care – PPO | Source: Ambulatory Visit | Attending: Emergency Medicine | Admitting: Emergency Medicine

## 2019-08-10 DIAGNOSIS — J439 Emphysema, unspecified: Secondary | ICD-10-CM

## 2019-08-10 NOTE — Progress Notes (Signed)
Daily Session Note  Patient Details  Name: Peter Mahoney MRN: [id number] Date of Birth: Dec 31, 1960 Referring Provider:     PULMONARY REHAB OTHER RESP ORIENTATION from 05/28/2019 in [place]  Referring Provider  Dr. Lamonte Sakai      Encounter Date: 08/10/2019  Check In: Session Check In - 08/10/19 1045      Check-In   Supervising physician immediately available to respond to emergencies  See telemetry face sheet for immediately available MD    Location  [facility]    Staff Present  Russella Dar, MS, EP, [facility], Exercise Physiologist;Other    Virtual Visit  No    Medication changes reported      No    Fall or balance concerns reported     No    Tobacco Cessation  No Change    Warm-up and Cool-down  Performed as group-led instruction    Resistance Training Performed  Yes    VAD Patient?  No    PAD/SET Patient?  No      Pain Assessment   Currently in Pain?  No/denies    Pain Score  0-No pain    Multiple Pain Sites  No       Capillary Blood Glucose: No results found for this or any previous visit (from the past 24 hour(s)).    Social History   Tobacco Use  Smoking Status Former Smoker  . Packs/day: 1.00  . Years: 35.00  . Pack years: 35.00  . Types: Cigarettes  . Quit date: 11/01/2015  . Years since quitting: 3.7  Smokeless Tobacco Never Used    Goals Met:  Proper associated with RPD/PD & O2 Sat Independence with exercise equipment Improved SOB with ADL's Using PLB without cueing & demonstrates good technique Exercise tolerated well Personal goals reviewed No report of cardiac concerns or symptoms Strength training completed today  Goals Unmet:  Not Applicable  Comments: check out 11:45   Dr. Kate Sable is Medical Director for [facility] and Pulmonary Rehab.

## 2019-08-11 NOTE — Progress Notes (Signed)
Pulmonary Individual Treatment Plan  Patient Details  Name: Peter Mahoney MRN: [id number] Date of Birth: 10/23/1960 Referring Provider:     PULMONARY REHAB OTHER RESP ORIENTATION from 05/28/2019 in [place]  Referring Provider  Dr. Lamonte Sakai      Initial Encounter Date:    PULMONARY [facility] from 05/28/2019 in [place]  Date  05/28/19      Visit Diagnosis: Pulmonary emphysema, unspecified emphysema type ([place])  Patient's Home Medications on Admission:   Current Outpatient Medications:  .  albuterol (VENTOLIN HFA) 108 (90 Base) MCG/ACT inhaler, Inhale 2 puffs into the lungs every 4 (four) hours as needed for wheezing or shortness of breath., Disp: 18 g, Rfl: 5 .  diazepam (VALIUM) 10 MG tablet, 10 mg at bedtime. , Disp: , Rfl:  .  RABEprazole (ACIPHEX) 20 MG tablet, Take 20 mg by mouth in the morning and at bedtime. , Disp: , Rfl:  .  sildenafil (VIAGRA) 100 MG tablet, Take 1 tablet (100 mg total) by mouth daily as needed for erectile dysfunction., Disp: 10 tablet, Rfl: 6 .  tamsulosin (FLOMAX) 0.4 MG CAPS capsule, Take 0.4 mg by mouth at bedtime. , Disp: , Rfl:  .  Tiotropium Bromide Monohydrate (SPIRIVA RESPIMAT) 2.5 MCG/ACT AERS, Inhale 2 puffs into the lungs daily., Disp: 8 g, Rfl: 0  Past Medical History: Past Medical History:  Diagnosis Date  . Dental crown present   . GERD (gastroesophageal reflux disease)   . Post-operative infection 09/2011   left leg  . Seasonal allergies   . Wound infection after surgery, left lateral leg 10/08/2011    Tobacco Use: Social History   Tobacco Use  Smoking Status Former Smoker  . Packs/day: 1.00  . Years: 35.00  . Pack years: 35.00  . Types: Cigarettes  . Quit date: 11/01/2015  . Years since quitting: 3.7  Smokeless Tobacco Never Used    Labs: Recent Review Flowsheet Data    There is no flowsheet data to display.      Capillary Blood Glucose: No results found  for: GLUCAP   Pulmonary Assessment Scores: Pulmonary Assessment Scores    Row Name 05/28/19 1152         ADL UCSD   ADL Phase  Entry     SOB Score total  105     Rest  4     Walk  14     Stairs  5     Bath  5     Dress  4     Shop  4       CAT Score   CAT Score  31       mMRC Score   mMRC Score  5       UCSD: Self-administered rating of dyspnea associated with activities of daily living (ADLs) 6-point scale (0 = "not at all" to 5 = "maximal or unable to do because of breathlessness")  Scoring Scores range from 0 to 120.  Minimally important difference is 5 units  CAT: CAT can identify the health impairment of COPD patients and is better correlated with disease progression.  CAT has a scoring range of zero to 40. The CAT score is classified into four groups of low (less than 10), medium (10 - 20), high (21-30) and very high (31-40) based on the impact level of disease on health status. A CAT score over 10 suggests significant symptoms.  A worsening CAT score could be explained  by an exacerbation, poor medication adherence, poor inhaler technique, or progression of COPD or comorbid conditions.  CAT MCID is 2 points  mMRC: mMRC (Modified Medical Research Council) Dyspnea Scale is used to assess the degree of baseline functional disability in patients of respiratory disease due to dyspnea. No minimal important difference is established. A decrease in score of 1 point or greater is considered a positive change.   Pulmonary Function Assessment: Pulmonary Function Assessment - 05/28/19 1151      Pulmonary Function Tests   FVC%  92 %    FEV1%  91 %    FEV1/FVC Ratio  99    DLCO%  48 %      Initial Spirometry Results   FVC%  92 %    FEV1%  91 %    FEV1/FVC Ratio  99      Post Bronchodilator Spirometry Results   FVC%  91 %    FEV1%  88 %    FEV1/FVC Ratio  97      Breath   Bilateral Breath Sounds  Clear    Shortness of Breath  Yes       Exercise Target  Goals: Exercise Program Goal: Individual exercise prescription set using results from initial 6 min walk test and THRR while considering  patient's activity barriers and safety.   Exercise Prescription Goal: Initial exercise prescription builds to 30-45 minutes a day of aerobic activity, 2-3 days per week.  Home exercise guidelines will be given to patient during program as part of exercise prescription that the participant will acknowledge.  Activity Barriers & Risk Stratification: Activity Barriers & Cardiac Risk Stratification - 05/28/19 1124      Activity Barriers & Cardiac Risk Stratification   Activity Barriers  Shortness of Breath;Other (comment)   knee surgeryx4, plantar fascitis (L) foot   Cardiac Risk Stratification  Low       6 Minute Walk: 6 Minute Walk    Row Name 05/28/19 1121         6 Minute Walk   Phase  Initial     Distance  1400 feet     Walk Time  6 minutes     # of Rest Breaks  0     MPH  2.65     METS  3.03     RPE  14     Perceived Dyspnea   16     VO2 Peak  13.49     Symptoms  Yes (comment)     Comments  SOB     Resting HR  66 bpm     Resting BP  120/80     Resting Oxygen Saturation   97 %     Exercise Oxygen Saturation  during 6 min walk  94 %     Max Ex. HR  99 bpm     Max Ex. BP  124/84     2 Minute Post BP  120/80        Oxygen Initial Assessment: Oxygen Initial Assessment - 05/28/19 1149      Home Oxygen   Home Oxygen Device  None    Sleep Oxygen Prescription  None    Home Exercise Oxygen Prescription  None    Home at Rest Exercise Oxygen Prescription  None      Initial 6 min Walk   Oxygen Used  None      Program Oxygen Prescription   Program Oxygen Prescription  None  Intervention   Short Term Goals  To learn and understand importance of monitoring SPO2 with pulse oximeter and demonstrate accurate use of the pulse oximeter.;To learn and understand importance of maintaining oxygen saturations>88%;To learn and demonstrate  proper pursed lip breathing techniques or other breathing techniques.    Long  Term Goals  Verbalizes importance of monitoring SPO2 with pulse oximeter and return demonstration;Maintenance of O2 saturations>88%;Exhibits proper breathing techniques, such as pursed lip breathing or other method taught during program session       Oxygen Re-Evaluation: Oxygen Re-Evaluation    Row Name 06/24/19 1256 07/16/19 0757 08/11/19 1612         Program Oxygen Prescription   Program Oxygen Prescription  None  None  None       Home Oxygen   Home Oxygen Device  None  None  None     Sleep Oxygen Prescription  None  None  None     Home Exercise Oxygen Prescription  None  None  None     Home at Rest Exercise Oxygen Prescription  None  None  None     Compliance with Home Oxygen Use  --  Yes  -- NA       Goals/Expected Outcomes   Short Term Goals  To learn and understand importance of monitoring SPO2 with pulse oximeter and demonstrate accurate use of the pulse oximeter.;To learn and understand importance of maintaining oxygen saturations>88%;To learn and demonstrate proper pursed lip breathing techniques or other breathing techniques.  To learn and understand importance of monitoring SPO2 with pulse oximeter and demonstrate accurate use of the pulse oximeter.;To learn and understand importance of maintaining oxygen saturations>88%;To learn and demonstrate proper pursed lip breathing techniques or other breathing techniques.  To learn and understand importance of monitoring SPO2 with pulse oximeter and demonstrate accurate use of the pulse oximeter.;To learn and understand importance of maintaining oxygen saturations>88%;To learn and demonstrate proper pursed lip breathing techniques or other breathing techniques.;To learn and exhibit compliance with exercise, home and travel O2 prescription     Long  Term Goals  Verbalizes importance of monitoring SPO2 with pulse oximeter and return demonstration;Maintenance  of O2 saturations>88%;Exhibits proper breathing techniques, such as pursed lip breathing or other method taught during program session  Verbalizes importance of monitoring SPO2 with pulse oximeter and return demonstration;Maintenance of O2 saturations>88%;Exhibits proper breathing techniques, such as pursed lip breathing or other method taught during program session  Verbalizes importance of monitoring SPO2 with pulse oximeter and return demonstration;Maintenance of O2 saturations>88%;Exhibits proper breathing techniques, such as pursed lip breathing or other method taught during program session;Exhibits compliance with exercise, home and travel O2 prescription;Compliance with respiratory medication     Comments  Patiient is ablet to verbalize the importance of maintaining his O2 saturation >88% and the importance of monitoring his O2 saturation. He exhibits proper pursed lip breathing techniques in class.  Patiient is ablet to verbalize the importance of maintaining his O2 saturation >88% and the importance of monitoring his O2 saturation and is albe to properly demonstrate usage of pulse oximeter. He exhibits proper pursed lip breathing techniques in class.  Patiient is able to verbalize the importance of maintaining his O2 saturation >88% and the importance of monitoring his O2 saturation and is albe to properly demonstrate usage of pulse oximeter. He exhibits proper pursed lip breathing techniques in class.He is also complaint with his home exercise prescription and all medications. Will continue to monitor for progress.     Goals/Expected Outcomes  Patient will continue to meet both his short and long term goals.  Patient will continue to meet both his short and long term goals.  Patient will continue to meet both his short and long term goals.        Oxygen Discharge (Final Oxygen Re-Evaluation): Oxygen Re-Evaluation - 08/11/19 1612      Program Oxygen Prescription   Program Oxygen Prescription   None      Home Oxygen   Home Oxygen Device  None    Sleep Oxygen Prescription  None    Home Exercise Oxygen Prescription  None    Home at Rest Exercise Oxygen Prescription  None    Compliance with Home Oxygen Use  --   NA     Goals/Expected Outcomes   Short Term Goals  To learn and understand importance of monitoring SPO2 with pulse oximeter and demonstrate accurate use of the pulse oximeter.;To learn and understand importance of maintaining oxygen saturations>88%;To learn and demonstrate proper pursed lip breathing techniques or other breathing techniques.;To learn and exhibit compliance with exercise, home and travel O2 prescription    Long  Term Goals  Verbalizes importance of monitoring SPO2 with pulse oximeter and return demonstration;Maintenance of O2 saturations>88%;Exhibits proper breathing techniques, such as pursed lip breathing or other method taught during program session;Exhibits compliance with exercise, home and travel O2 prescription;Compliance with respiratory medication    Comments  Patiient is able to verbalize the importance of maintaining his O2 saturation >88% and the importance of monitoring his O2 saturation and is albe to properly demonstrate usage of pulse oximeter. He exhibits proper pursed lip breathing techniques in class.He is also complaint with his home exercise prescription and all medications. Will continue to monitor for progress.    Goals/Expected Outcomes  Patient will continue to meet both his short and long term goals.       Initial Exercise Prescription: Initial Exercise Prescription - 05/28/19 1100      Date of Initial Exercise RX and Referring Provider   Date  05/28/19    Referring Provider  Dr. Lamonte Sakai    Expected Discharge Date  08/25/19      Treadmill   MPH  1.3    Grade  0    Minutes  17    METs  1.99      Recumbant Elliptical   Level  1    RPM  45    Watts  64    Minutes  20    METs  3.5      Prescription Details   Frequency  (times per week)  2    Duration  Progress to 30 minutes of continuous aerobic without signs/symptoms of physical distress      Intensity   THRR 40-80% of Max Heartrate  [phone]    Ratings of Perceived Exertion  11-13    Perceived Dyspnea  0-4      Progression   Progression  Continue to progress workloads to maintain intensity without signs/symptoms of physical distress.      Resistance Training   Training Prescription  Yes    Weight  1    Reps  10-15       Perform Capillary Blood Glucose checks as needed.  Exercise Prescription Changes:  Exercise Prescription Changes    Row Name 06/14/19 1500 06/23/19 1400 07/15/19 1500 08/12/19 0800       Response to Exercise   Blood Pressure (Admit)  142/88  148/82  110/60  122/70  Blood Pressure (Exercise)  148/82  138/80  118/70  132/78    Blood Pressure (Exit)  118/72  104/70  100/60  132/78    Heart Rate (Admit)  82 bpm  86 bpm  55 bpm  77 bpm    Heart Rate (Exercise)  105 bpm  118 bpm  83 bpm  89 bpm    Heart Rate (Exit)  86 bpm  81 bpm  63 bpm  76 bpm    Oxygen Saturation (Admit)  98 %  97 %  96 %  95 %    Oxygen Saturation (Exercise)  95 %  95 %  95 %  96 %    Oxygen Saturation (Exit)  97 %  97 %  95 %  95 %    Rating of Perceived Exertion (Exercise)  12  12  12  12     Perceived Dyspnea (Exercise)  13  11  12  12     Symptoms  SOB  SOB  SOB  SOB    Comments  has not been 2wks  --  --  --    Duration  Continue with 30 min of aerobic exercise without signs/symptoms of physical distress.  Continue with 30 min of aerobic exercise without signs/symptoms of physical distress.  Continue with 30 min of aerobic exercise without signs/symptoms of physical distress.  Continue with 30 min of aerobic exercise without signs/symptoms of physical distress.    Intensity  THRR unchanged  THRR unchanged  THRR New  THRR unchanged      Progression   Progression  Continue to progress workloads to maintain intensity without signs/symptoms of  physical distress.  Continue to progress workloads to maintain intensity without signs/symptoms of physical distress.  Continue to progress workloads to maintain intensity without signs/symptoms of physical distress.  Continue to progress workloads to maintain intensity without signs/symptoms of physical distress.    Average METs  2.64  2.54  2.73  2.87      Resistance Training   Training Prescription  Yes  Yes  Yes  Yes    Weight  1  2  2  3     Reps  10-15  10-15  10-15  10-15    Time  10 Minutes  10 Minutes  10 Minutes  10 Minutes      Oxygen   Liters  0  0  0  --      Treadmill   MPH  1.3  1.4  1.5  1.6    Grade  0  0  0  0    Minutes  17  17  17  17     METs  1.99  2.07  2.07  2.22      Recumbant Elliptical   Level  1  1  1  2     RPM  50  47  49  51    Watts  60  57  60  64    Minutes  22  22  22  22     METs  3.3  3.1  3.4  3.6      Home Exercise Plan   Plans to continue exercise at  Home (comment)  Home (comment)  Home (comment)  Home (comment)    Frequency  Add 3 additional days to program exercise sessions.  Add 3 additional days to program exercise sessions.  Add 3 additional days to program exercise sessions.  Add 3 additional days to program exercise sessions.  Initial Home Exercises Provided  05/28/19  05/28/19  05/28/19  05/28/19       Exercise Comments:  Exercise Comments    Row Name 06/14/19 1520 06/23/19 1528 07/15/19 1552 08/12/19 [id number]     Exercise Comments  Patient is doing well for such a short period of time. Will continue to progress his workloads.  Patient is doing well with his first 2 week. Will continue to progress as tolerated.  We will continue to progress as tolerated.  Patient has been consistent with his attendance. He is doing well. Even though he is still SOB it is making him stronger. Patient does enjoy coming to class.       Exercise Goals and Review:  Exercise Goals    Row Name 05/28/19 1141             Exercise Goals   Increase  Physical Activity  Yes       Intervention  Provide advice, education, support and counseling about physical activity/exercise needs.;Develop an individualized exercise prescription for aerobic and resistive training based on initial evaluation findings, risk stratification, comorbidities and participant's personal goals.       Expected Outcomes  Short Term: Attend rehab on a regular basis to increase amount of physical activity.;Long Term: Add in home exercise to make exercise part of routine and to increase amount of physical activity.       Increase Strength and Stamina  Yes       Intervention  Provide advice, education, support and counseling about physical activity/exercise needs.;Develop an individualized exercise prescription for aerobic and resistive training based on initial evaluation findings, risk stratification, comorbidities and participant's personal goals.       Expected Outcomes  Short Term: Perform resistance training exercises routinely during rehab and add in resistance training at home;Long Term: Improve cardiorespiratory fitness, muscular endurance and strength as measured by increased METs and functional capacity (6MWT)       Able to understand and use rate of perceived exertion (RPE) scale  Yes       Intervention  Provide education and explanation on how to use RPE scale       Expected Outcomes  Short Term: Able to use RPE daily in rehab to express subjective intensity level;Long Term:  Able to use RPE to guide intensity level when exercising independently       Able to understand and use Dyspnea scale  Yes       Intervention  Provide education and explanation on how to use Dyspnea scale       Expected Outcomes  Short Term: Able to use Dyspnea scale daily in rehab to express subjective sense of shortness of breath during exertion;Long Term: Able to use Dyspnea scale to guide intensity level when exercising independently       Knowledge and understanding of Target Heart Rate Range  (THRR)  Yes       Intervention  Provide education and explanation of THRR including how the numbers were predicted and where they are located for reference       Expected Outcomes  Short Term: Able to use daily as guideline for intensity in rehab;Long Term: Able to use THRR to govern intensity when exercising independently       Able to check pulse independently  Yes       Intervention  Provide education and demonstration on how to check pulse in carotid and radial arteries.;Review the importance of being able to check your own pulse for safety  during independent exercise       Expected Outcomes  Short Term: Able to explain why pulse checking is important during independent exercise;Long Term: Able to check pulse independently and accurately       Understanding of Exercise Prescription  Yes       Intervention  Provide education, explanation, and written materials on patient's individual exercise prescription       Expected Outcomes  Short Term: Able to explain program exercise prescription;Long Term: Able to explain home exercise prescription to exercise independently          Exercise Goals Re-Evaluation : Exercise Goals Re-Evaluation    Row Name 06/14/19 1516 06/23/19 1525 07/15/19 1549 08/12/19 0831       Exercise Goal Re-Evaluation   Exercise Goals Review  Increase Physical Activity;Increase Strength and Stamina  Increase Physical Activity;Increase Strength and Stamina  Increase Physical Activity;Increase Strength and Stamina  Increase Physical Activity;Increase Strength and Stamina    Comments  Patient has just started program. This is his 4th session. He is already making progress with walking on the TM and using the seated ellipitcal. We will progress his workload in the next 2 weeks.  Patient has done well. This is his first progression. Will monitor how he tolerates it and his level of SOB. He has improved walking on the TM. He can now do the entire 17 minutes without stopping. He  tolerates the seated elliptical well.  Patient is progressing slow but steady. He is still SOB and has a persistent cough. He has a doctors appointment schduled with his pulmonologist for next week. His goals are to decrease his SOB, and become active.  Main goals are to help with his SOB and become more active.    Expected Outcomes  His main goals are to reduce his SOB and to become more active. We will monitor his this and ask patient if he feels he is reaching his expected goals.  Reach in expected goals of decreasing his SOB, being able to become more active.  To reach his personal goals  Reach his personal goals.       Discharge Exercise Prescription (Final Exercise Prescription Changes): Exercise Prescription Changes - 08/12/19 0800      Response to Exercise   Blood Pressure (Admit)  122/70    Blood Pressure (Exercise)  132/78    Blood Pressure (Exit)  132/78    Heart Rate (Admit)  77 bpm    Heart Rate (Exercise)  89 bpm    Heart Rate (Exit)  76 bpm    Oxygen Saturation (Admit)  95 %    Oxygen Saturation (Exercise)  96 %    Oxygen Saturation (Exit)  95 %    Rating of Perceived Exertion (Exercise)  12    Perceived Dyspnea (Exercise)  12    Symptoms  SOB    Duration  Continue with 30 min of aerobic exercise without signs/symptoms of physical distress.    Intensity  THRR unchanged      Progression   Progression  Continue to progress workloads to maintain intensity without signs/symptoms of physical distress.    Average METs  2.87      Resistance Training   Training Prescription  Yes    Weight  3    Reps  10-15    Time  10 Minutes      Treadmill   MPH  1.6    Grade  0    Minutes  17    METs  2.22      Recumbant Elliptical   Level  2    RPM  51    Watts  64    Minutes  22    METs  3.6      Home Exercise Plan   Plans to continue exercise at  Home (comment)    Frequency  Add 3 additional days to program exercise sessions.    Initial Home Exercises Provided  05/28/19        Nutrition:  Target Goals: Understanding of nutrition guidelines, daily intake of sodium 1500mg , cholesterol 200mg , calories 30% from fat and 7% or less from saturated fats, daily to have 5 or more servings of fruits and vegetables.  Biometrics: Pre Biometrics - 05/28/19 1144      Pre Biometrics   Height  5\' 10"  (1.778 m)    Weight  85.6 kg    Waist Circumference  38 inches    Hip Circumference  36 inches    Waist to Hip Ratio  1.06 %    BMI (Calculated)  27.08    Triceps Skinfold  8 mm    % Body Fat  23.2 %    Grip Strength  31.6 kg    Flexibility  0 in    Single Leg Stand  60 seconds        Nutrition Therapy Plan and Nutrition Goals: Nutrition Therapy & Goals - 08/11/19 1614      Personal Nutrition Goals   Comments  We continue to work on scheduling patients for the RD class. Patient scored 66 on his medificts diet assessment score. We have provided handouts for education. He says he is eating less red meat and fried foods and eating more salads and more chicken. Will continue to monitor for progress.      Intervention Plan   Intervention  Nutrition handout(s) given to patient.       Nutrition Assessments: Nutrition Assessments - 05/28/19 1158      MEDFICTS Scores   Pre Score  66       Nutrition Goals Re-Evaluation:   Nutrition Goals Discharge (Final Nutrition Goals Re-Evaluation):   Psychosocial: Target Goals: Acknowledge presence or absence of significant depression and/or stress, maximize coping skills, provide positive support system. Participant is able to verbalize types and ability to use techniques and skills needed for reducing stress and depression.  Initial Review & Psychosocial Screening: Initial Psych Review & Screening - 05/28/19 1154      Initial Review   Current issues with  None Identified      Family Dynamics   Good Support System?  Yes    Concerns  No support system      Barriers   Psychosocial barriers to participate in  program  There are no identifiable barriers or psychosocial needs.      Screening Interventions   Interventions  Encouraged to exercise    Expected Outcomes  Short Term goal: Identification and review with participant of any Quality of Life or Depression concerns found by scoring the questionnaire.;Long Term goal: The participant improves quality of Life and PHQ9 Scores as seen by post scores and/or verbalization of changes       Quality of Life Scores: Quality of Life - 05/28/19 1155      Quality of Life Scores   Health/Function Pre  12.38 %    Socioeconomic Pre  30 %    Psych/Spiritual Pre  27.43 %    Family Pre  24 %  GLOBAL Pre  20.29 %      Scores of 19 and below usually indicate a poorer quality of life in these areas.  A difference of  2-3 points is a clinically meaningful difference.  A difference of 2-3 points in the total score of the Quality of Life Index has been associated with significant improvement in overall quality of life, self-image, physical symptoms, and general health in studies assessing change in quality of life.   PHQ-9: Recent Review Flowsheet Data    Depression screen [place] [facility] 2/9 05/28/2019   Decreased Interest 0   Down, Depressed, Hopeless 0   PHQ - 2 Score 0   Altered sleeping 3   Tired, decreased energy 2   Change in appetite 0   Feeling bad or failure about yourself  0   Trouble concentrating 0   Moving slowly or fidgety/restless 0   Suicidal thoughts 0   PHQ-9 Score 5   Difficult doing work/chores Very difficult     Interpretation of Total Score  Total Score Depression Severity:  1-4 = Minimal depression, 5-9 = Mild depression, 10-14 = Moderate depression, 15-19 = Moderately severe depression, 20-27 = Severe depression   Psychosocial Evaluation and Intervention: Psychosocial Evaluation - 05/28/19 1155      Psychosocial Evaluation & Interventions   Interventions  Encouraged to exercise with the program and follow exercise prescription     Continue Psychosocial Services   No Follow up required       Psychosocial Re-Evaluation: Psychosocial Re-Evaluation    [place] Name 06/24/19 1300 07/16/19 0803 08/11/19 1618         Psychosocial Re-Evaluation   Current issues with  None Identified  None Identified  None Identified     Comments  Patient's initial QOL was 20.29 and PHQ-9 was 5. He continues to take Valium 10 mg daily. He has no psychosocial issues identified.  Patient's initial QOL was 20.29 and PHQ-9 was 5. He continues to take Valium 10 mg daily. He has no psychosocial issues identified.  Patient's initial QOL was 20.29 and PHQ-9 was 5. He continues to take Valium 10 mg daily. He has no psychosocial issues identified.     Expected Outcomes  Patient will have no psychosocial issues identified at discharge.  Patient will have no psychosocial issues identified at discharge.  Patient will have no psychosocial issues identified at discharge.     Interventions  Encouraged to attend Pulmonary Rehabilitation for the exercise;Relaxation education;Stress management education  Encouraged to attend Pulmonary Rehabilitation for the exercise;Relaxation education;Stress management education  Encouraged to attend Pulmonary Rehabilitation for the exercise;Relaxation education;Stress management education     Continue Psychosocial Services   No Follow up required  No Follow up required  No Follow up required        Psychosocial Discharge (Final Psychosocial Re-Evaluation): Psychosocial Re-Evaluation - 08/11/19 1618      Psychosocial Re-Evaluation   Current issues with  None Identified    Comments  Patient's initial QOL was 20.29 and PHQ-9 was 5. He continues to take Valium 10 mg daily. He has no psychosocial issues identified.    Expected Outcomes  Patient will have no psychosocial issues identified at discharge.    Interventions  Encouraged to attend Pulmonary Rehabilitation for the exercise;Relaxation education;Stress management education     Continue Psychosocial Services   No Follow up required        Education: Education Goals: Education classes will be provided on a weekly basis, covering required topics. Participant will state  understanding/return demonstration of topics presented.  Learning Barriers/Preferences: Learning Barriers/Preferences - 05/28/19 1159      Learning Barriers/Preferences   Learning Barriers  None    Learning Preferences  Group Instruction;Skilled Demonstration       Education Topics: How Lungs Work and Diseases: - Discuss the anatomy of the lungs and diseases that can affect the lungs, such as COPD.   PULMONARY REHAB OTHER RESPIRATORY from 08/05/2019 in [place]  Date  07/29/19  Educator  D. Coad  Instruction Review Code  2- Demonstrated Understanding      Exercise: -Discuss the importance of exercise, FITT principles of exercise, normal and abnormal responses to exercise, and how to exercise safely.   Environmental Irritants: -Discuss types of environmental irritants and how to limit exposure to environmental irritants.   PULMONARY REHAB OTHER RESPIRATORY from 08/05/2019 in [place]  Date  08/05/19  Educator  DC  Instruction Review Code  2- Demonstrated Understanding      Meds/Inhalers and oxygen: - Discuss respiratory medications, definition of an inhaler and oxygen, and the proper way to use an inhaler and oxygen.   Energy Saving Techniques: - Discuss methods to conserve energy and decrease shortness of breath when performing activities of daily living.    PULMONARY REHAB OTHER RESPIRATORY from 08/05/2019 in [place]  Date  07/15/19  Educator  [place]  Instruction Review Code  2- Demonstrated Understanding      Bronchial Hygiene / Breathing Techniques: - Discuss breathing mechanics, pursed-lip breathing technique,  proper posture, effective ways to clear airways, and other functional breathing  techniques   Cleaning Equipment: - Provides group verbal and written instruction about the health risks of elevated stress, cause of high stress, and healthy ways to reduce stress.   Nutrition I: Fats: - Discuss the types of cholesterol, what cholesterol does to the body, and how cholesterol levels can be controlled.   Nutrition II: Labels: -Discuss the different components of food labels and how to read food labels.   PULMONARY REHAB OTHER RESPIRATORY from 08/05/2019 in [place]  Date  06/17/19  Educator  DWynetta Emery  Instruction Review Code  2- Demonstrated Understanding      Respiratory Infections: - Discuss the signs and symptoms of respiratory infections, ways to prevent respiratory infections, and the importance of seeking medical treatment when having a respiratory infection.   Stress I: Signs and Symptoms: - Discuss the causes of stress, how stress may lead to anxiety and depression, and ways to limit stress.   PULMONARY REHAB OTHER RESPIRATORY from 08/05/2019 in [place]  Date  07/01/19  Educator  D.Lendon George  Instruction Review Code  2- Demonstrated Understanding      Stress II: Relaxation: -Discuss relaxation techniques to limit stress.   PULMONARY REHAB OTHER RESPIRATORY from 08/05/2019 in [place]  Date  06/24/19  Educator  DC  Instruction Review Code  2- Demonstrated Understanding      Oxygen for Home/Travel: - Discuss how to prepare for travel when on oxygen and proper ways to transport and store oxygen to ensure safety.   PULMONARY REHAB OTHER RESPIRATORY from 08/05/2019 in [place]  Date  07/15/19  Educator  Etheleen Mayhew  Instruction Review Code  2- Demonstrated Understanding      Knowledge Questionnaire Score: Knowledge Questionnaire Score - 05/28/19 1204      Knowledge Questionnaire Score   Pre Score  16/18  Core Components/Risk Factors/Patient  Goals at Admission: Personal Goals and Risk Factors at Admission - 05/28/19 1204      Core Components/Risk Factors/Patient Goals on Admission    Weight Management  Weight Maintenance    Personal Goal Other  Yes    Personal Goal  Help with SOB, Be more active    Intervention  Attend PR 2 x week and supplement with 3 x week of TM waling and stationary bike riding at home.    Expected Outcomes  Reach goals       Core Components/Risk Factors/Patient Goals Review:  Goals and Risk Factor Review    Row Name 06/24/19 1259 07/16/19 0758 08/11/19 1614         Core Components/Risk Factors/Patient Goals Review   Personal Goals Review  Weight Management/Obesity;Improve shortness of breath with ADL's Decrease SOB; be more active.  Weight Management/Obesity;Improve shortness of breath with ADL's Decrease SOB; be more active.  Weight Management/Obesity;Improve shortness of breath with ADL's Decrease SOB; be more active.     Review  Patient has completed 7 sessions gaining 6 lbs since his orientation visit. He is doing well in the program with some progressions. He says he has not noticed an improvement in his SOB yet but is enjoying the program. Will continue to monitor for progress.  Patient has completed 14 sessions gaining 1 lb since last 30 day review. He continues to do well in the program with progression. He has recently been having more congestion and coughing up more phlegm. His pulmonologist retired and he has finally found a MD to go to. He has an appointment in April. He does feel like he is making progress with getting stronger but his SOB has not changed. He still howerer, feels the program is a benefit and hopes his new MD will have some new treatment options. Will continue to  monitor for progress.  Patient has completed 21 sessions losing 3 lbs since his last 30 day review. He continues to do well in the program with progression. He states he has built strength in his muscles but his SOB has not  changed. He does feel stronger. He says he is going to keep pushing himself to get through the program and also continue to exercise at home. Will continue to monitor for progress.     Expected Outcomes  Patient will continue to attend sessions and complete the program meeting his personal goals.  Patient will continue to attend sessions and complete the program meeting his personal goals.  Patient will continue to attend sessions and complete the program meeting both his personal and program goals.        Core Components/Risk Factors/Patient Goals at Discharge (Final Review):  Goals and Risk Factor Review - 08/11/19 1614      Core Components/Risk Factors/Patient Goals Review   Personal Goals Review  Weight Management/Obesity;Improve shortness of breath with ADL's   Decrease SOB; be more active.   Review  Patient has completed 21 sessions losing 3 lbs since his last 30 day review. He continues to do well in the program with progression. He states he has built strength in his muscles but his SOB has not changed. He does feel stronger. He says he is going to keep pushing himself to get through the program and also continue to exercise at home. Will continue to monitor for progress.    Expected Outcomes  Patient will continue to attend sessions and complete the program meeting both his personal and program  goals.       ITP Comments: ITP Comments    Row Name 05/28/19 1117           ITP Comments  Patient is eager to get started.          Comments: ITP REVIEW Pt is making expected progress toward pulmonary rehab goals after completing 21 sessions. Recommend continued exercise, life style modification, education, and utilization of breathing techniques to increase stamina and strength and decrease shortness of breath with exertion.

## 2019-08-12 ENCOUNTER — Other Ambulatory Visit: Payer: Self-pay

## 2019-08-12 ENCOUNTER — Encounter (HOSPITAL_COMMUNITY)
Admission: RE | Admit: 2019-08-12 | Discharge: 2019-08-12 | Disposition: A | Discharge status: Home / Self Care | Payer: BC Managed Care – PPO | Source: Ambulatory Visit | Attending: Emergency Medicine | Admitting: Emergency Medicine

## 2019-08-12 DIAGNOSIS — J439 Emphysema, unspecified: Secondary | ICD-10-CM | POA: Diagnosis not present

## 2019-08-12 NOTE — Progress Notes (Signed)
Daily Session Note  Patient Details  Name: Peter Mahoney MRN: [id number] Date of Birth: [birth year] Referring Provider:     PULMONARY REHAB OTHER RESP ORIENTATION from 05/28/2019 in [place]  Referring Provider  Dr. Lamonte Sakai      Encounter Date: 08/12/2019  Check In: Session Check In - 08/12/19 1045      Check-In   Supervising physician immediately available to respond to emergencies  See telemetry face sheet for immediately available MD    Location  [facility]    Staff Present  Russella Dar, MS, EP, [facility], Exercise Physiologist;Bernice Mcauliffe Wynetta Emery, RN, BSN    Virtual Visit  No    Medication changes reported      No    Fall or balance concerns reported     No    Tobacco Cessation  No Change    Warm-up and Cool-down  Performed as group-led instruction    Resistance Training Performed  Yes    VAD Patient?  No    PAD/SET Patient?  No      Pain Assessment   Currently in Pain?  No/denies    Pain Score  0-No pain    Multiple Pain Sites  No       Capillary Blood Glucose: No results found for this or any previous visit (from the past 24 hour(s)).    Social History   Tobacco Use  Smoking Status Former Smoker  . Packs/day: 1.00  . Years: 35.00  . Pack years: 35.00  . Types: Cigarettes  . Quit date: 11/01/2015  . Years since quitting: 3.7  Smokeless Tobacco Never Used    Goals Met:  Proper associated with RPD/PD & O2 Sat Independence with exercise equipment Improved SOB with ADL's Using PLB without cueing & demonstrates good technique Exercise tolerated well No report of cardiac concerns or symptoms Strength training completed today  Goals Unmet:  Not Applicable  Comments: Check out 1145.   Dr. Kate Sable is Medical Director for [facility] Cardiac and Pulmonary Rehab.

## 2019-08-13 ENCOUNTER — Ambulatory Visit: Payer: BC Managed Care – PPO | Attending: Internal Medicine

## 2019-08-13 DIAGNOSIS — Z23 Encounter for immunization: Secondary | ICD-10-CM

## 2019-08-13 NOTE — Progress Notes (Signed)
   Covid-19 Vaccination Clinic  Name:  Peter Mahoney    MRN: [id number] DOB: 11/01/1960  08/13/2019  [name] was observed post Covid-19 immunization for 15 minutes without incident. He was provided with Vaccine Information Sheet and instruction to access the V-Safe system.   [name] was instructed to call 911 with any severe reactions post vaccine: Marland Kitchen Difficulty breathing  . Swelling of face and throat  . A fast heartbeat  . A bad rash all over body  . Dizziness and weakness   Immunizations Administered    Name Date Dose VIS Date Route   Pfizer COVID-19 Vaccine 08/13/2019 11:25 AM 0.3 mL 06/16/2018 Intramuscular   Manufacturer: [place]   Lot: [id number]   [place]: [id number]

## 2019-08-17 ENCOUNTER — Encounter (HOSPITAL_COMMUNITY)
Admission: RE | Admit: 2019-08-17 | Discharge: 2019-08-17 | Disposition: A | Discharge status: Home / Self Care | Payer: BC Managed Care – PPO | Source: Ambulatory Visit | Attending: Emergency Medicine | Admitting: Emergency Medicine

## 2019-08-17 ENCOUNTER — Other Ambulatory Visit: Payer: Self-pay

## 2019-08-17 DIAGNOSIS — J439 Emphysema, unspecified: Secondary | ICD-10-CM | POA: Diagnosis not present

## 2019-08-17 NOTE — Progress Notes (Signed)
Daily Session Note  Patient Details  Name: Peter Mahoney MRN: [id number] Date of Birth: 03-Jul-1960 Referring Provider:     PULMONARY REHAB OTHER RESP ORIENTATION from 05/28/2019 in [place]  Referring Provider  Dr. Lamonte Sakai      Encounter Date: 08/17/2019  Check In: Session Check In - 08/17/19 1057      Check-In   Supervising physician immediately available to respond to emergencies  See telemetry face sheet for immediately available MD    Location  [facility]    Staff Present  Russella Dar, MS, EP, [facility], Exercise Physiologist;Other    Virtual Visit  No    Medication changes reported      No    Fall or balance concerns reported     No    Tobacco Cessation  No Change    Warm-up and Cool-down  Performed as group-led instruction    Resistance Training Performed  Yes    VAD Patient?  No    PAD/SET Patient?  No      Pain Assessment   Currently in Pain?  No/denies    Pain Score  0-No pain    Multiple Pain Sites  No       Capillary Blood Glucose: No results found for this or any previous visit (from the past 24 hour(s)).    Social History   Tobacco Use  Smoking Status Former Smoker  . Packs/day: 1.00  . Years: 35.00  . Pack years: 35.00  . Types: Cigarettes  . Quit date: 11/01/2015  . Years since quitting: 3.7  Smokeless Tobacco Never Used    Goals Met:  Proper associated with RPD/PD & O2 Sat Independence with exercise equipment Improved SOB with ADL's Using PLB without cueing & demonstrates good technique Exercise tolerated well Personal goals reviewed No report of cardiac concerns or symptoms Strength training completed today  Goals Unmet:  Not Applicable  Comments: check out 11:45   Dr. Kate Sable is Medical Director for [place] and Pulmonary Rehab.

## 2019-08-19 ENCOUNTER — Other Ambulatory Visit: Payer: Self-pay

## 2019-08-19 ENCOUNTER — Encounter (HOSPITAL_COMMUNITY)
Admission: RE | Admit: 2019-08-19 | Discharge: 2019-08-19 | Disposition: A | Discharge status: Home / Self Care | Payer: BC Managed Care – PPO | Source: Ambulatory Visit | Attending: Emergency Medicine | Admitting: Emergency Medicine

## 2019-08-19 DIAGNOSIS — J439 Emphysema, unspecified: Secondary | ICD-10-CM

## 2019-08-19 NOTE — Progress Notes (Signed)
Daily Session Note  Patient Details  Name: Peter Mahoney MRN: [id number] Date of Birth: February 09, 1961 Referring Provider:     PULMONARY REHAB OTHER RESP ORIENTATION from 05/28/2019 in [place]  Referring Provider  Dr. Lamonte Sakai      Encounter Date: 08/19/2019  Check In: Session Check In - 08/19/19 1045      Check-In   Supervising physician immediately available to respond to emergencies  See telemetry face sheet for immediately available MD    Location  [facility]    Staff Present  Russella Dar, MS, EP, [facility], Exercise Physiologist;Other;Linc Renne Wynetta Emery, RN, BSN    Virtual Visit  No    Medication changes reported      No    Fall or balance concerns reported     No    Tobacco Cessation  No Change    Warm-up and Cool-down  Performed as group-led instruction    Resistance Training Performed  Yes    VAD Patient?  No    PAD/SET Patient?  No      Pain Assessment   Currently in Pain?  No/denies    Pain Score  0-No pain    Multiple Pain Sites  No       Capillary Blood Glucose: No results found for this or any previous visit (from the past 24 hour(s)).    Social History   Tobacco Use  Smoking Status Former Smoker  . Packs/day: 1.00  . Years: 35.00  . Pack years: 35.00  . Types: Cigarettes  . Quit date: 11/01/2015  . Years since quitting: 3.8  Smokeless Tobacco Never Used    Goals Met:  Proper associated with RPD/PD & O2 Sat Independence with exercise equipment Improved SOB with ADL's Using PLB without cueing & demonstrates good technique Exercise tolerated well No report of cardiac concerns or symptoms Strength training completed today  Goals Unmet:  Not Applicable  Comments: Check out 1145.   Dr. Kate Sable is Medical Director for [facility] Cardiac and Pulmonary Rehab.

## 2019-08-20 ENCOUNTER — Telehealth: Payer: Self-pay | Admitting: Emergency Medicine

## 2019-08-20 NOTE — Telephone Encounter (Signed)
Received forms from RB's inbox. Will place forms in RB's look at folder.

## 2019-08-24 ENCOUNTER — Encounter (HOSPITAL_COMMUNITY)
Admission: RE | Admit: 2019-08-24 | Discharge: 2019-08-24 | Disposition: A | Discharge status: Home / Self Care | Payer: BC Managed Care – PPO | Source: Ambulatory Visit | Attending: Emergency Medicine | Admitting: Emergency Medicine

## 2019-08-24 ENCOUNTER — Other Ambulatory Visit: Payer: Self-pay

## 2019-08-24 DIAGNOSIS — J439 Emphysema, unspecified: Secondary | ICD-10-CM | POA: Diagnosis not present

## 2019-08-24 NOTE — Progress Notes (Signed)
Daily Session Note  Patient Details  Name: Peter Mahoney MRN: [id number] Date of Birth: Oct 19, 1960 Referring Provider:     PULMONARY REHAB OTHER RESP ORIENTATION from 05/28/2019 in [place]  Referring Provider  Dr. Lamonte Sakai      Encounter Date: 08/24/2019  Check In: Session Check In - 08/24/19 1045      Check-In   Supervising physician immediately available to respond to emergencies  See telemetry face sheet for immediately available MD    Location  [facility]    Staff Present  Russella Dar, MS, EP, [facility], Exercise Physiologist;Other;Anna-Marie Coller Wynetta Emery, RN, BSN    Virtual Visit  No    Medication changes reported      No    Fall or balance concerns reported     No    Tobacco Cessation  No Change    Warm-up and Cool-down  Performed as group-led instruction    Resistance Training Performed  Yes    VAD Patient?  No    PAD/SET Patient?  No      Pain Assessment   Currently in Pain?  No/denies    Pain Score  0-No pain    Multiple Pain Sites  No       Capillary Blood Glucose: No results found for this or any previous visit (from the past 24 hour(s)).    Social History   Tobacco Use  Smoking Status Former Smoker  . Packs/day: 1.00  . Years: 35.00  . Pack years: 35.00  . Types: Cigarettes  . Quit date: 11/01/2015  . Years since quitting: 3.8  Smokeless Tobacco Never Used    Goals Met:  Proper associated with RPD/PD & O2 Sat Independence with exercise equipment Improved SOB with ADL's Using PLB without cueing & demonstrates good technique Exercise tolerated well No report of cardiac concerns or symptoms Strength training completed today  Goals Unmet:  Not Applicable  Comments: Check out 1145.   Dr. Kate Sable is Medical Director for [facility] Cardiac and Pulmonary Rehab.

## 2019-08-26 ENCOUNTER — Encounter (HOSPITAL_COMMUNITY)
Admission: RE | Admit: 2019-08-26 | Discharge: 2019-08-26 | Disposition: A | Discharge status: Home / Self Care | Payer: BC Managed Care – PPO | Source: Ambulatory Visit | Attending: Emergency Medicine | Admitting: Emergency Medicine

## 2019-08-26 ENCOUNTER — Other Ambulatory Visit: Payer: Self-pay

## 2019-08-26 DIAGNOSIS — J439 Emphysema, unspecified: Secondary | ICD-10-CM | POA: Diagnosis not present

## 2019-08-26 NOTE — Progress Notes (Signed)
Daily Session Note  Patient Details  Name: LYONEL MOREJON MRN: [id number] Date of Birth: [birth year] Referring Provider:     PULMONARY REHAB OTHER RESP ORIENTATION from 05/28/2019 in [place]  Referring Provider  Dr. Lamonte Sakai      Encounter Date: 08/26/2019  Check In: Session Check In - 08/26/19 1045      Check-In   Supervising physician immediately available to respond to emergencies  See telemetry face sheet for immediately available MD    Location  [facility]    Staff Present  Russella Dar, MS, EP, [facility], Exercise Physiologist;Other    Virtual Visit  No    Medication changes reported      No    Fall or balance concerns reported     No    Tobacco Cessation  No Change    Warm-up and Cool-down  Performed as group-led instruction    Resistance Training Performed  Yes    VAD Patient?  No    PAD/SET Patient?  No      Pain Assessment   Currently in Pain?  No/denies    Pain Score  0-No pain    Multiple Pain Sites  No       Capillary Blood Glucose: No results found for this or any previous visit (from the past 24 hour(s)).    Social History   Tobacco Use  Smoking Status Former Smoker  . Packs/day: 1.00  . Years: 35.00  . Pack years: 35.00  . Types: Cigarettes  . Quit date: 11/01/2015  . Years since quitting: 3.8  Smokeless Tobacco Never Used    Goals Met:  Proper associated with RPD/PD & O2 Sat Independence with exercise equipment Improved SOB with ADL's Using PLB without cueing & demonstrates good technique Exercise tolerated well Personal goals reviewed No report of cardiac concerns or symptoms Strength training completed today  Goals Unmet:  Not Applicable  Comments: check out 11:45   Dr. Kate Sable is Medical Director for [place] and Pulmonary Rehab.

## 2019-08-27 NOTE — Telephone Encounter (Signed)
Dr Lamonte Sakai back in the clinic on 09/03/19  Will f/u after then to ensure this was signed

## 2019-08-31 ENCOUNTER — Encounter (HOSPITAL_COMMUNITY)
Admission: RE | Admit: 2019-08-31 | Discharge: 2019-08-31 | Disposition: A | Discharge status: Home / Self Care | Payer: BC Managed Care – PPO | Source: Ambulatory Visit | Attending: Emergency Medicine | Admitting: Emergency Medicine

## 2019-08-31 ENCOUNTER — Other Ambulatory Visit: Payer: Self-pay

## 2019-08-31 DIAGNOSIS — J439 Emphysema, unspecified: Secondary | ICD-10-CM | POA: Diagnosis not present

## 2019-08-31 NOTE — Progress Notes (Signed)
Daily Session Note  Patient Details  Name: GUSTABO GORDILLO MRN: [id number] Date of Birth: 1960/11/24 Referring Provider:     PULMONARY REHAB OTHER RESP ORIENTATION from 05/28/2019 in [place]  Referring Provider  Dr. Lamonte Sakai      Encounter Date: 08/31/2019  Check In: Session Check In - 08/31/19 1045      Check-In   Supervising physician immediately available to respond to emergencies  See telemetry face sheet for immediately available MD    Location  [facility]    Staff Present  Russella Dar, MS, EP, [facility], Exercise Physiologist;Other    Virtual Visit  No    Medication changes reported      No    Fall or balance concerns reported     No    Tobacco Cessation  No Change    Warm-up and Cool-down  Performed as group-led instruction    Resistance Training Performed  Yes    VAD Patient?  No    PAD/SET Patient?  No      Pain Assessment   Currently in Pain?  No/denies    Pain Score  0-No pain    Multiple Pain Sites  No       Capillary Blood Glucose: No results found for this or any previous visit (from the past 24 hour(s)).    Social History   Tobacco Use  Smoking Status Former Smoker  . Packs/day: 1.00  . Years: 35.00  . Pack years: 35.00  . Types: Cigarettes  . Quit date: 11/01/2015  . Years since quitting: 3.8  Smokeless Tobacco Never Used    Goals Met:  Proper associated with RPD/PD & O2 Sat Independence with exercise equipment Improved SOB with ADL's Using PLB without cueing & demonstrates good technique Exercise tolerated well Personal goals reviewed No report of cardiac concerns or symptoms Strength training completed today  Goals Unmet:  Not Applicable  Comments: check out 11:45   Dr. Kate Sable is Medical Director for [place] and Pulmonary Rehab.

## 2019-09-02 ENCOUNTER — Other Ambulatory Visit: Payer: Self-pay

## 2019-09-02 ENCOUNTER — Encounter (HOSPITAL_COMMUNITY)
Admission: RE | Admit: 2019-09-02 | Discharge: 2019-09-02 | Disposition: A | Discharge status: Home / Self Care | Payer: BC Managed Care – PPO | Source: Ambulatory Visit | Attending: Emergency Medicine | Admitting: Emergency Medicine

## 2019-09-02 DIAGNOSIS — J439 Emphysema, unspecified: Secondary | ICD-10-CM | POA: Diagnosis not present

## 2019-09-02 NOTE — Progress Notes (Signed)
Daily Session Note  Patient Details  Name: Peter Mahoney MRN: [id number] Date of Birth: December 25, 1960 Referring Provider:     PULMONARY REHAB OTHER RESP ORIENTATION from 05/28/2019 in [place]  Referring Provider  Dr. Lamonte Sakai      Encounter Date: 09/02/2019  Check In: Session Check In - 09/02/19 1045      Check-In   Supervising physician immediately available to respond to emergencies  See telemetry face sheet for immediately available MD    Location  [facility]    Staff Present  Russella Dar, MS, EP, [facility], Exercise Physiologist;Other    Virtual Visit  No    Medication changes reported      No    Fall or balance concerns reported     No    Tobacco Cessation  No Change    Warm-up and Cool-down  Performed as group-led instruction    Resistance Training Performed  Yes    VAD Patient?  No    PAD/SET Patient?  No      Pain Assessment   Currently in Pain?  No/denies    Pain Score  0-No pain    Multiple Pain Sites  No       Capillary Blood Glucose: No results found for this or any previous visit (from the past 24 hour(s)).    Social History   Tobacco Use  Smoking Status Former Smoker  . Packs/day: 1.00  . Years: 35.00  . Pack years: 35.00  . Types: Cigarettes  . Quit date: 11/01/2015  . Years since quitting: 3.8  Smokeless Tobacco Never Used    Goals Met:  Proper associated with RPD/PD & O2 Sat Independence with exercise equipment Improved SOB with ADL's Using PLB without cueing & demonstrates good technique Exercise tolerated well Personal goals reviewed No report of cardiac concerns or symptoms Strength training completed today  Goals Unmet:  Not Applicable  Comments: Check out: 1045   Dr. Kate Sable is Medical Director for [place] and Pulmonary Rehab.

## 2019-09-02 NOTE — Progress Notes (Signed)
Pulmonary Individual Treatment Plan  Patient Details  Name: Peter Mahoney MRN: [id number] Date of Birth: January 25, 1961 Referring Provider:     PULMONARY REHAB OTHER RESP ORIENTATION from 05/28/2019 in [place]  Referring Provider  Dr. Lamonte Sakai      Initial Encounter Date:    PULMONARY [place] from 05/28/2019 in [place]  Date  05/28/19      Visit Diagnosis: Pulmonary emphysema, unspecified emphysema type ([place])  Patient's Home Medications on Admission:   Current Outpatient Medications:  .  albuterol (VENTOLIN HFA) 108 (90 Base) MCG/ACT inhaler, Inhale 2 puffs into the lungs every 4 (four) hours as needed for wheezing or shortness of breath., Disp: 18 g, Rfl: 5 .  diazepam (VALIUM) 10 MG tablet, 10 mg at bedtime. , Disp: , Rfl:  .  RABEprazole (ACIPHEX) 20 MG tablet, Take 20 mg by mouth in the morning and at bedtime. , Disp: , Rfl:  .  sildenafil (VIAGRA) 100 MG tablet, Take 1 tablet (100 mg total) by mouth daily as needed for erectile dysfunction., Disp: 10 tablet, Rfl: 6 .  tamsulosin (FLOMAX) 0.4 MG CAPS capsule, Take 0.4 mg by mouth at bedtime. , Disp: , Rfl:  .  Tiotropium Bromide Monohydrate (SPIRIVA RESPIMAT) 2.5 MCG/ACT AERS, Inhale 2 puffs into the lungs daily., Disp: 8 g, Rfl: 0  Past Medical History: Past Medical History:  Diagnosis Date  . Dental crown present   . GERD (gastroesophageal reflux disease)   . Post-operative infection 09/2011   left leg  . Seasonal allergies   . Wound infection after surgery, left lateral leg 10/08/2011    Tobacco Use: Social History   Tobacco Use  Smoking Status Former Smoker  . Packs/day: 1.00  . Years: 35.00  . Pack years: 35.00  . Types: Cigarettes  . Quit date: 11/01/2015  . Years since quitting: 3.8  Smokeless Tobacco Never Used    Labs: Recent Review Flowsheet Data    There is no flowsheet data to display.      Capillary Blood Glucose: No results found  for: GLUCAP   Pulmonary Assessment Scores: Pulmonary Assessment Scores    Row Name 05/28/19 1152         ADL UCSD   ADL Phase  Entry     SOB Score total  105     Rest  4     Walk  14     Stairs  5     Bath  5     Dress  4     Shop  4       CAT Score   CAT Score  31       mMRC Score   mMRC Score  5       UCSD: Self-administered rating of dyspnea associated with activities of daily living (ADLs) 6-point scale (0 = "not at all" to 5 = "maximal or unable to do because of breathlessness")  Scoring Scores range from 0 to 120.  Minimally important difference is 5 units  CAT: CAT can identify the health impairment of COPD patients and is better correlated with disease progression.  CAT has a scoring range of zero to 40. The CAT score is classified into four groups of low (less than 10), medium (10 - 20), high (21-30) and very high (31-40) based on the impact level of disease on health status. A CAT score over 10 suggests significant symptoms.  A worsening CAT score could be explained  by an exacerbation, poor medication adherence, poor inhaler technique, or progression of COPD or comorbid conditions.  CAT MCID is 2 points  mMRC: mMRC (Modified Medical Research Council) Dyspnea Scale is used to assess the degree of baseline functional disability in patients of respiratory disease due to dyspnea. No minimal important difference is established. A decrease in score of 1 point or greater is considered a positive change.   Pulmonary Function Assessment: Pulmonary Function Assessment - 05/28/19 1151      Pulmonary Function Tests   FVC%  92 %    FEV1%  91 %    FEV1/FVC Ratio  99    DLCO%  48 %      Initial Spirometry Results   FVC%  92 %    FEV1%  91 %    FEV1/FVC Ratio  99      Post Bronchodilator Spirometry Results   FVC%  91 %    FEV1%  88 %    FEV1/FVC Ratio  97      Breath   Bilateral Breath Sounds  Clear    Shortness of Breath  Yes       Exercise Target  Goals: Exercise Program Goal: Individual exercise prescription set using results from initial 6 min walk test and THRR while considering  patient's activity barriers and safety.   Exercise Prescription Goal: Initial exercise prescription builds to 30-45 minutes a day of aerobic activity, 2-3 days per week.  Home exercise guidelines will be given to patient during program as part of exercise prescription that the participant will acknowledge.  Activity Barriers & Risk Stratification: Activity Barriers & Cardiac Risk Stratification - 05/28/19 1124      Activity Barriers & Cardiac Risk Stratification   Activity Barriers  Shortness of Breath;Other (comment)   knee surgeryx4, plantar fascitis (L) foot   Cardiac Risk Stratification  Low       6 Minute Walk: 6 Minute Walk    Row Name 05/28/19 1121         6 Minute Walk   Phase  Initial     Distance  1400 feet     Walk Time  6 minutes     # of Rest Breaks  0     MPH  2.65     METS  3.03     RPE  14     Perceived Dyspnea   16     VO2 Peak  13.49     Symptoms  Yes (comment)     Comments  SOB     Resting HR  66 bpm     Resting BP  120/80     Resting Oxygen Saturation   97 %     Exercise Oxygen Saturation  during 6 min walk  94 %     Max Ex. HR  99 bpm     Max Ex. BP  124/84     2 Minute Post BP  120/80        Oxygen Initial Assessment: Oxygen Initial Assessment - 05/28/19 1149      Home Oxygen   Home Oxygen Device  None    Sleep Oxygen Prescription  None    Home Exercise Oxygen Prescription  None    Home at Rest Exercise Oxygen Prescription  None      Initial 6 min Walk   Oxygen Used  None      Program Oxygen Prescription   Program Oxygen Prescription  None  Intervention   Short Term Goals  To learn and understand importance of monitoring SPO2 with pulse oximeter and demonstrate accurate use of the pulse oximeter.;To learn and understand importance of maintaining oxygen saturations>88%;To learn and demonstrate  proper pursed lip breathing techniques or other breathing techniques.    Long  Term Goals  Verbalizes importance of monitoring SPO2 with pulse oximeter and return demonstration;Maintenance of O2 saturations>88%;Exhibits proper breathing techniques, such as pursed lip breathing or other method taught during program session       Oxygen Re-Evaluation: Oxygen Re-Evaluation    Row Name 06/24/19 1256 07/16/19 0757 08/11/19 1612 09/02/19 0735       Program Oxygen Prescription   Program Oxygen Prescription  None  None  None  None      Home Oxygen   Home Oxygen Device  None  None  None  None    Sleep Oxygen Prescription  None  None  None  None    Home Exercise Oxygen Prescription  None  None  None  None    Home at Rest Exercise Oxygen Prescription  None  None  None  None    Compliance with Home Oxygen Use  --  Yes  -- NA  Yes      Goals/Expected Outcomes   Short Term Goals  To learn and understand importance of monitoring SPO2 with pulse oximeter and demonstrate accurate use of the pulse oximeter.;To learn and understand importance of maintaining oxygen saturations>88%;To learn and demonstrate proper pursed lip breathing techniques or other breathing techniques.  To learn and understand importance of monitoring SPO2 with pulse oximeter and demonstrate accurate use of the pulse oximeter.;To learn and understand importance of maintaining oxygen saturations>88%;To learn and demonstrate proper pursed lip breathing techniques or other breathing techniques.  To learn and understand importance of monitoring SPO2 with pulse oximeter and demonstrate accurate use of the pulse oximeter.;To learn and understand importance of maintaining oxygen saturations>88%;To learn and demonstrate proper pursed lip breathing techniques or other breathing techniques.;To learn and exhibit compliance with exercise, home and travel O2 prescription  To learn and understand importance of monitoring SPO2 with pulse oximeter and  demonstrate accurate use of the pulse oximeter.;To learn and demonstrate proper pursed lip breathing techniques or other breathing techniques.;To learn and exhibit compliance with exercise, home and travel O2 prescription;To learn and understand importance of maintaining oxygen saturations>88%    Long  Term Goals  Verbalizes importance of monitoring SPO2 with pulse oximeter and return demonstration;Maintenance of O2 saturations>88%;Exhibits proper breathing techniques, such as pursed lip breathing or other method taught during program session  Verbalizes importance of monitoring SPO2 with pulse oximeter and return demonstration;Maintenance of O2 saturations>88%;Exhibits proper breathing techniques, such as pursed lip breathing or other method taught during program session  Verbalizes importance of monitoring SPO2 with pulse oximeter and return demonstration;Maintenance of O2 saturations>88%;Exhibits proper breathing techniques, such as pursed lip breathing or other method taught during program session;Exhibits compliance with exercise, home and travel O2 prescription;Compliance with respiratory medication  Verbalizes importance of monitoring SPO2 with pulse oximeter and return demonstration;Maintenance of O2 saturations>88%;Exhibits proper breathing techniques, such as pursed lip breathing or other method taught during program session;Exhibits compliance with exercise, home and travel O2 prescription;Compliance with respiratory medication    Comments  Patiient is ablet to verbalize the importance of maintaining his O2 saturation >88% and the importance of monitoring his O2 saturation. He exhibits proper pursed lip breathing techniques in class.  Patiient is ablet to verbalize the importance of  maintaining his O2 saturation >88% and the importance of monitoring his O2 saturation and is albe to properly demonstrate usage of pulse oximeter. He exhibits proper pursed lip breathing techniques in class.  Patiient is  able to verbalize the importance of maintaining his O2 saturation >88% and the importance of monitoring his O2 saturation and is albe to properly demonstrate usage of pulse oximeter. He exhibits proper pursed lip breathing techniques in class.He is also complaint with his home exercise prescription and all medications. Will continue to monitor for progress.  Patiient is able to verbalize the importance of maintaining his O2 saturation >88% and the importance of monitoring his O2 saturation and is albe to properly demonstrate usage of pulse oximeter. He exhibits proper pursed lip breathing techniques in class.He is also complaint with his home exercise prescription and all medications. Will continue to monitor for progress.    Goals/Expected Outcomes  Patient will continue to meet both his short and long term goals.  Patient will continue to meet both his short and long term goals.  Patient will continue to meet both his short and long term goals.  Patient will continue to meet both his short and long term goals.       Oxygen Discharge (Final Oxygen Re-Evaluation): Oxygen Re-Evaluation - 09/02/19 0735      Program Oxygen Prescription   Program Oxygen Prescription  None      Home Oxygen   Home Oxygen Device  None    Sleep Oxygen Prescription  None    Home Exercise Oxygen Prescription  None    Home at Rest Exercise Oxygen Prescription  None    Compliance with Home Oxygen Use  Yes      Goals/Expected Outcomes   Short Term Goals  To learn and understand importance of monitoring SPO2 with pulse oximeter and demonstrate accurate use of the pulse oximeter.;To learn and demonstrate proper pursed lip breathing techniques or other breathing techniques.;To learn and exhibit compliance with exercise, home and travel O2 prescription;To learn and understand importance of maintaining oxygen saturations>88%    Long  Term Goals  Verbalizes importance of monitoring SPO2 with pulse oximeter and return  demonstration;Maintenance of O2 saturations>88%;Exhibits proper breathing techniques, such as pursed lip breathing or other method taught during program session;Exhibits compliance with exercise, home and travel O2 prescription;Compliance with respiratory medication    Comments  Patiient is able to verbalize the importance of maintaining his O2 saturation >88% and the importance of monitoring his O2 saturation and is albe to properly demonstrate usage of pulse oximeter. He exhibits proper pursed lip breathing techniques in class.He is also complaint with his home exercise prescription and all medications. Will continue to monitor for progress.    Goals/Expected Outcomes  Patient will continue to meet both his short and long term goals.       Initial Exercise Prescription: Initial Exercise Prescription - 05/28/19 1100      Date of Initial Exercise RX and Referring Provider   Date  05/28/19    Referring Provider  Dr. Lamonte Sakai    Expected Discharge Date  08/25/19      Treadmill   MPH  1.3    Grade  0    Minutes  17    METs  1.99      Recumbant Elliptical   Level  1    RPM  45    Watts  64    Minutes  20    METs  3.5  Prescription Details   Frequency (times per week)  2    Duration  Progress to 30 minutes of continuous aerobic without signs/symptoms of physical distress      Intensity   THRR 40-80% of Max Heartrate  [phone]    Ratings of Perceived Exertion  11-13    Perceived Dyspnea  0-4      Progression   Progression  Continue to progress workloads to maintain intensity without signs/symptoms of physical distress.      Resistance Training   Training Prescription  Yes    Weight  1    Reps  10-15       Perform Capillary Blood Glucose checks as needed.  Exercise Prescription Changes:  Exercise Prescription Changes    Row Name 06/14/19 1500 06/23/19 1400 07/15/19 1500 08/12/19 0800 09/06/19 1300     Response to Exercise   Blood Pressure (Admit)  142/88  148/82   110/60  122/70  104/62   Blood Pressure (Exercise)  148/82  138/80  118/70  132/78  110/50   Blood Pressure (Exit)  118/72  104/70  100/60  132/78  90/60   Heart Rate (Admit)  82 bpm  86 bpm  55 bpm  77 bpm  68 bpm   Heart Rate (Exercise)  105 bpm  118 bpm  83 bpm  89 bpm  82 bpm   Heart Rate (Exit)  86 bpm  81 bpm  63 bpm  76 bpm  68 bpm   Oxygen Saturation (Admit)  98 %  97 %  96 %  95 %  95 %   Oxygen Saturation (Exercise)  95 %  95 %  95 %  96 %  98 %   Oxygen Saturation (Exit)  97 %  97 %  95 %  95 %  95 %   Rating of Perceived Exertion (Exercise)  12  12  12  12  11    Perceived Dyspnea (Exercise)  13  11  12  12  13    Symptoms  SOB  SOB  SOB  SOB  SOB   Comments  has not been 2wks  --  --  --  --   Duration  Continue with 30 min of aerobic exercise without signs/symptoms of physical distress.  Continue with 30 min of aerobic exercise without signs/symptoms of physical distress.  Continue with 30 min of aerobic exercise without signs/symptoms of physical distress.  Continue with 30 min of aerobic exercise without signs/symptoms of physical distress.  Progress to 30 minutes of  aerobic without signs/symptoms of physical distress   Intensity  THRR unchanged  THRR unchanged  THRR New  THRR unchanged  THRR unchanged     Progression   Progression  Continue to progress workloads to maintain intensity without signs/symptoms of physical distress.  Continue to progress workloads to maintain intensity without signs/symptoms of physical distress.  Continue to progress workloads to maintain intensity without signs/symptoms of physical distress.  Continue to progress workloads to maintain intensity without signs/symptoms of physical distress.  Continue to progress workloads to maintain intensity without signs/symptoms of physical distress.   Average METs  2.64  2.54  2.73  2.87  2.96     Resistance Training   Training Prescription  Yes  Yes  Yes  Yes  Yes   Weight  1  2  2  3  4    Reps  10-15  10-15   10-15  10-15  10-15   Time  10  Minutes  10 Minutes  10 Minutes  10 Minutes  10 Minutes     Oxygen   Liters  0  0  0  --  0     Treadmill   MPH  1.3  1.4  1.5  1.6  1.7   Grade  0  0  0  0  0   Minutes  17  17  17  17  17    METs  1.99  2.07  2.07  2.22  2.3     Recumbant Elliptical   Level  1  1  1  2  3    RPM  50  47  49  51  52   Watts  60  57  60  64  66   Minutes  22  22  22  22  22    METs  3.3  3.1  3.4  3.6  3.7     Home Exercise Plan   Plans to continue exercise at  Home (comment)  Home (comment)  Home (comment)  Home (comment)  Home (comment)   Frequency  Add 3 additional days to program exercise sessions.  Add 3 additional days to program exercise sessions.  Add 3 additional days to program exercise sessions.  Add 3 additional days to program exercise sessions.  Add 3 additional days to program exercise sessions.   Initial Home Exercises Provided  05/28/19  05/28/19  05/28/19  05/28/19  05/28/19      Exercise Comments:  Exercise Comments    Row Name 06/14/19 1520 06/23/19 1528 07/15/19 1552 08/12/19 0832 09/06/19 1406   Exercise Comments  Patient is doing well for such a short period of time. Will continue to progress his workloads.  Patient is doing well with his first 2 week. Will continue to progress as tolerated.  We will continue to progress as tolerated.  Patient has been consistent with his attendance. He is doing well. Even though he is still SOB it is making him stronger. Patient does enjoy coming to class.  Patient is coming regularly and has a great attitude. Patient tries very hard in order to improve. Will continue to progress as tolerated.      Exercise Goals and Review:  Exercise Goals    Row Name 05/28/19 1141             Exercise Goals   Increase Physical Activity  Yes       Intervention  Provide advice, education, support and counseling about physical activity/exercise needs.;Develop an individualized exercise prescription for aerobic and  resistive training based on initial evaluation findings, risk stratification, comorbidities and participant's personal goals.       Expected Outcomes  Short Term: Attend rehab on a regular basis to increase amount of physical activity.;Long Term: Add in home exercise to make exercise part of routine and to increase amount of physical activity.       Increase Strength and Stamina  Yes       Intervention  Provide advice, education, support and counseling about physical activity/exercise needs.;Develop an individualized exercise prescription for aerobic and resistive training based on initial evaluation findings, risk stratification, comorbidities and participant's personal goals.       Expected Outcomes  Short Term: Perform resistance training exercises routinely during rehab and add in resistance training at home;Long Term: Improve cardiorespiratory fitness, muscular endurance and strength as measured by increased METs and functional capacity (6MWT)       Able to understand and  use rate of perceived exertion (RPE) scale  Yes       Intervention  Provide education and explanation on how to use RPE scale       Expected Outcomes  Short Term: Able to use RPE daily in rehab to express subjective intensity level;Long Term:  Able to use RPE to guide intensity level when exercising independently       Able to understand and use Dyspnea scale  Yes       Intervention  Provide education and explanation on how to use Dyspnea scale       Expected Outcomes  Short Term: Able to use Dyspnea scale daily in rehab to express subjective sense of shortness of breath during exertion;Long Term: Able to use Dyspnea scale to guide intensity level when exercising independently       Knowledge and understanding of Target Heart Rate Range (THRR)  Yes       Intervention  Provide education and explanation of THRR including how the numbers were predicted and where they are located for reference       Expected Outcomes  Short Term: Able  to use daily as guideline for intensity in rehab;Long Term: Able to use THRR to govern intensity when exercising independently       Able to check pulse independently  Yes       Intervention  Provide education and demonstration on how to check pulse in carotid and radial arteries.;Review the importance of being able to check your own pulse for safety during independent exercise       Expected Outcomes  Short Term: Able to explain why pulse checking is important during independent exercise;Long Term: Able to check pulse independently and accurately       Understanding of Exercise Prescription  Yes       Intervention  Provide education, explanation, and written materials on patient's individual exercise prescription       Expected Outcomes  Short Term: Able to explain program exercise prescription;Long Term: Able to explain home exercise prescription to exercise independently          Exercise Goals Re-Evaluation : Exercise Goals Re-Evaluation    Row Name 06/14/19 1516 06/23/19 1525 07/15/19 1549 08/12/19 0831 09/06/19 1403     Exercise Goal Re-Evaluation   Exercise Goals Review  Increase Physical Activity;Increase Strength and Stamina  Increase Physical Activity;Increase Strength and Stamina  Increase Physical Activity;Increase Strength and Stamina  Increase Physical Activity;Increase Strength and Stamina  Increase Physical Activity;Increase Strength and Stamina   Comments  Patient has just started program. This is his 4th session. He is already making progress with walking on the TM and using the seated ellipitcal. We will progress his workload in the next 2 weeks.  Patient has done well. This is his first progression. Will monitor how he tolerates it and his level of SOB. He has improved walking on the TM. He can now do the entire 17 minutes without stopping. He tolerates the seated elliptical well.  Patient is progressing slow but steady. He is still SOB and has a persistent cough. He has a doctors  appointment schduled with his pulmonologist for next week. His goals are to decrease his SOB, and become active.  Main goals are to help with his SOB and become more active.  Patient wants to improve shortness of breathe and be more active. Patient is becoming more active even though he is still short or breath.   Expected Outcomes  His main goals are  to reduce his SOB and to become more active. We will monitor his this and ask patient if he feels he is reaching his expected goals.  Reach in expected goals of decreasing his SOB, being able to become more active.  To reach his personal goals  Reach his personal goals.  To reach expected goals      Discharge Exercise Prescription (Final Exercise Prescription Changes): Exercise Prescription Changes - 09/06/19 1300      Response to Exercise   Blood Pressure (Admit)  104/62    Blood Pressure (Exercise)  110/50    Blood Pressure (Exit)  90/60    Heart Rate (Admit)  68 bpm    Heart Rate (Exercise)  82 bpm    Heart Rate (Exit)  68 bpm    Oxygen Saturation (Admit)  95 %    Oxygen Saturation (Exercise)  98 %    Oxygen Saturation (Exit)  95 %    Rating of Perceived Exertion (Exercise)  11    Perceived Dyspnea (Exercise)  13    Symptoms  SOB    Duration  Progress to 30 minutes of  aerobic without signs/symptoms of physical distress    Intensity  THRR unchanged      Progression   Progression  Continue to progress workloads to maintain intensity without signs/symptoms of physical distress.    Average METs  2.96      Resistance Training   Training Prescription  Yes    Weight  4    Reps  10-15    Time  10 Minutes      Oxygen   Liters  0      Treadmill   MPH  1.7    Grade  0    Minutes  17    METs  2.3      Recumbant Elliptical   Level  3    RPM  52    Watts  66    Minutes  22    METs  3.7      Home Exercise Plan   Plans to continue exercise at  Home (comment)    Frequency  Add 3 additional days to program exercise sessions.     Initial Home Exercises Provided  05/28/19       Nutrition:  Target Goals: Understanding of nutrition guidelines, daily intake of sodium 1500mg , cholesterol 200mg , calories 30% from fat and 7% or less from saturated fats, daily to have 5 or more servings of fruits and vegetables.  Biometrics: Pre Biometrics - 05/28/19 1144      Pre Biometrics   Height  5\' 10"  (1.778 m)    Weight  85.6 kg    Waist Circumference  38 inches    Hip Circumference  36 inches    Waist to Hip Ratio  1.06 %    BMI (Calculated)  27.08    Triceps Skinfold  8 mm    % Body Fat  23.2 %    Grip Strength  31.6 kg    Flexibility  0 in    Single Leg Stand  60 seconds        Nutrition Therapy Plan and Nutrition Goals: Nutrition Therapy & Goals - 09/02/19 0735      Personal Nutrition Goals   Comments  We continue to work on scheduling patients for the RD class. Patient scored 66 on his medificts diet assessment score. We have provided handouts for education. He says he is eating less red meat and fried  foods and eating more salads and more chicken. Will continue to monitor for progress.      Intervention Plan   Intervention  Nutrition handout(s) given to patient.       Nutrition Assessments: Nutrition Assessments - 05/28/19 1158      MEDFICTS Scores   Pre Score  66       Nutrition Goals Re-Evaluation:   Nutrition Goals Discharge (Final Nutrition Goals Re-Evaluation):   Psychosocial: Target Goals: Acknowledge presence or absence of significant depression and/or stress, maximize coping skills, provide positive support system. Participant is able to verbalize types and ability to use techniques and skills needed for reducing stress and depression.  Initial Review & Psychosocial Screening: Initial Psych Review & Screening - 05/28/19 1154      Initial Review   Current issues with  None Identified      Family Dynamics   Good Support System?  Yes    Concerns  No support system      Barriers    Psychosocial barriers to participate in program  There are no identifiable barriers or psychosocial needs.      Screening Interventions   Interventions  Encouraged to exercise    Expected Outcomes  Short Term goal: Identification and review with participant of any Quality of Life or Depression concerns found by scoring the questionnaire.;Long Term goal: The participant improves quality of Life and PHQ9 Scores as seen by post scores and/or verbalization of changes       Quality of Life Scores: Quality of Life - 05/28/19 1155      Quality of Life Scores   Health/Function Pre  12.38 %    Socioeconomic Pre  30 %    Psych/Spiritual Pre  27.43 %    Family Pre  24 %    GLOBAL Pre  20.29 %      Scores of 19 and below usually indicate a poorer quality of life in these areas.  A difference of  2-3 points is a clinically meaningful difference.  A difference of 2-3 points in the total score of the Quality of Life Index has been associated with significant improvement in overall quality of life, self-image, physical symptoms, and general health in studies assessing change in quality of life.   PHQ-9: Recent Review Flowsheet Data    Depression screen [facility] 2/9 05/28/2019   Decreased Interest 0   Down, Depressed, Hopeless 0   PHQ - 2 Score 0   Altered sleeping 3   Tired, decreased energy 2   Change in appetite 0   Feeling bad or failure about yourself  0   Trouble concentrating 0   Moving slowly or fidgety/restless 0   Suicidal thoughts 0   PHQ-9 Score 5   Difficult doing work/chores Very difficult     Interpretation of Total Score  Total Score Depression Severity:  1-4 = Minimal depression, 5-9 = Mild depression, 10-14 = Moderate depression, 15-19 = Moderately severe depression, 20-27 = Severe depression   Psychosocial Evaluation and Intervention: Psychosocial Evaluation - 05/28/19 1155      Psychosocial Evaluation & Interventions   Interventions  Encouraged to exercise with the program  and follow exercise prescription    Continue Psychosocial Services   No Follow up required       Psychosocial Re-Evaluation: Psychosocial Re-Evaluation    [place] Name 06/24/19 1300 07/16/19 0803 08/11/19 1618 09/02/19 0735       Psychosocial Re-Evaluation   Current issues with  None Identified  None Identified  None Identified  None Identified    Comments  Patient's initial QOL was 20.29 and PHQ-9 was 5. He continues to take Valium 10 mg daily. He has no psychosocial issues identified.  Patient's initial QOL was 20.29 and PHQ-9 was 5. He continues to take Valium 10 mg daily. He has no psychosocial issues identified.  Patient's initial QOL was 20.29 and PHQ-9 was 5. He continues to take Valium 10 mg daily. He has no psychosocial issues identified.  Patient's initial QOL was 20.29 and PHQ-9 was 5. He continues to take Valium 10 mg daily. He has no psychosocial issues identified.    Expected Outcomes  Patient will have no psychosocial issues identified at discharge.  Patient will have no psychosocial issues identified at discharge.  Patient will have no psychosocial issues identified at discharge.  Patient will have no psychosocial issues identified at discharge.    Interventions  Encouraged to attend Pulmonary Rehabilitation for the exercise;Relaxation education;Stress management education  Encouraged to attend Pulmonary Rehabilitation for the exercise;Relaxation education;Stress management education  Encouraged to attend Pulmonary Rehabilitation for the exercise;Relaxation education;Stress management education  Encouraged to attend Pulmonary Rehabilitation for the exercise;Relaxation education;Stress management education    Continue Psychosocial Services   No Follow up required  No Follow up required  No Follow up required  No Follow up required       Psychosocial Discharge (Final Psychosocial Re-Evaluation): Psychosocial Re-Evaluation - 09/02/19 0735      Psychosocial Re-Evaluation   Current  issues with  None Identified    Comments  Patient's initial QOL was 20.29 and PHQ-9 was 5. He continues to take Valium 10 mg daily. He has no psychosocial issues identified.    Expected Outcomes  Patient will have no psychosocial issues identified at discharge.    Interventions  Encouraged to attend Pulmonary Rehabilitation for the exercise;Relaxation education;Stress management education    Continue Psychosocial Services   No Follow up required        Education: Education Goals: Education classes will be provided on a weekly basis, covering required topics. Participant will state understanding/return demonstration of topics presented.  Learning Barriers/Preferences: Learning Barriers/Preferences - 05/28/19 1159      Learning Barriers/Preferences   Learning Barriers  None    Learning Preferences  Group Instruction;Skilled Demonstration       Education Topics: How Lungs Work and Diseases: - Discuss the anatomy of the lungs and diseases that can affect the lungs, such as COPD.   PULMONARY REHAB OTHER RESPIRATORY from 08/26/2019 in [place]  Date  07/29/19  Educator  D. Coad  Instruction Review Code  2- Demonstrated Understanding      Exercise: -Discuss the importance of exercise, FITT principles of exercise, normal and abnormal responses to exercise, and how to exercise safely.   Environmental Irritants: -Discuss types of environmental irritants and how to limit exposure to environmental irritants.   PULMONARY REHAB OTHER RESPIRATORY from 08/26/2019 in [place]  Date  08/05/19  Educator  DC  Instruction Review Code  2- Demonstrated Understanding      Meds/Inhalers and oxygen: - Discuss respiratory medications, definition of an inhaler and oxygen, and the proper way to use an inhaler and oxygen.   PULMONARY REHAB OTHER RESPIRATORY from 08/26/2019 in [place]  Date  08/12/19  Educator  D. Wynetta Emery       Energy Saving Techniques: - Discuss methods to conserve energy and decrease shortness of breath when performing activities of daily living.  PULMONARY REHAB OTHER RESPIRATORY from 08/26/2019 in [place]  Date  07/15/19  Educator  Etheleen Mayhew  Instruction Review Code  2- Demonstrated Understanding      Bronchial Hygiene / Breathing Techniques: - Discuss breathing mechanics, pursed-lip breathing technique,  proper posture, effective ways to clear airways, and other functional breathing techniques   Cleaning Equipment: - Provides group verbal and written instruction about the health risks of elevated stress, cause of high stress, and healthy ways to reduce stress.   PULMONARY REHAB OTHER RESPIRATORY from 08/26/2019 in [place]  Date  08/26/19  Educator  DC  Instruction Review Code  2- Demonstrated Understanding      Nutrition I: Fats: - Discuss the types of cholesterol, what cholesterol does to the body, and how cholesterol levels can be controlled.   Nutrition II: Labels: -Discuss the different components of food labels and how to read food labels.   PULMONARY REHAB OTHER RESPIRATORY from 08/26/2019 in [place]  Date  06/17/19  Educator  DWynetta Emery  Instruction Review Code  2- Demonstrated Understanding      Respiratory Infections: - Discuss the signs and symptoms of respiratory infections, ways to prevent respiratory infections, and the importance of seeking medical treatment when having a respiratory infection.   Stress I: Signs and Symptoms: - Discuss the causes of stress, how stress may lead to anxiety and depression, and ways to limit stress.   PULMONARY REHAB OTHER RESPIRATORY from 08/26/2019 in [place]  Date  07/01/19  Educator  D.Lizbett Garciagarcia  Instruction Review Code  2- Demonstrated Understanding      Stress II: Relaxation: -Discuss relaxation techniques to limit  stress.   PULMONARY REHAB OTHER RESPIRATORY from 08/26/2019 in [place]  Date  06/24/19  Educator  DC  Instruction Review Code  2- Demonstrated Understanding      Oxygen for Home/Travel: - Discuss how to prepare for travel when on oxygen and proper ways to transport and store oxygen to ensure safety.   PULMONARY REHAB OTHER RESPIRATORY from 08/26/2019 in [place]  Date  07/15/19  Educator  Etheleen Mayhew  Instruction Review Code  2- Demonstrated Understanding      Knowledge Questionnaire Score: Knowledge Questionnaire Score - 05/28/19 1204      Knowledge Questionnaire Score   Pre Score  16/18       Core Components/Risk Factors/Patient Goals at Admission: Personal Goals and Risk Factors at Admission - 05/28/19 1204      Core Components/Risk Factors/Patient Goals on Admission    Weight Management  Weight Maintenance    Personal Goal Other  Yes    Personal Goal  Help with SOB, Be more active    Intervention  Attend PR 2 x week and supplement with 3 x week of TM waling and stationary bike riding at home.    Expected Outcomes  Reach goals       Core Components/Risk Factors/Patient Goals Review:  Goals and Risk Factor Review    Row Name 06/24/19 1259 07/16/19 0758 08/11/19 1614 09/02/19 0735       Core Components/Risk Factors/Patient Goals Review   Personal Goals Review  Weight Management/Obesity;Improve shortness of breath with ADL's Decrease SOB; be more active.  Weight Management/Obesity;Improve shortness of breath with ADL's Decrease SOB; be more active.  Weight Management/Obesity;Improve shortness of breath with ADL's Decrease SOB; be more active.  Weight Management/Obesity;Improve shortness of breath with ADL's Decrease SOB; be more  active.    Review  Patient has completed 7 sessions gaining 6 lbs since his orientation visit. He is doing well in the program with some progressions. He says he has not noticed an improvement in his  SOB yet but is enjoying the program. Will continue to monitor for progress.  Patient has completed 14 sessions gaining 1 lb since last 30 day review. He continues to do well in the program with progression. He has recently been having more congestion and coughing up more phlegm. His pulmonologist retired and he has finally found a MD to go to. He has an appointment in April. He does feel like he is making progress with getting stronger but his SOB has not changed. He still howerer, feels the program is a benefit and hopes his new MD will have some new treatment options. Will continue to  monitor for progress.  Patient has completed 21 sessions losing 3 lbs since his last 30 day review. He continues to do well in the program with progression. He states he has built strength in his muscles but his SOB has not changed. He does feel stronger. He says he is going to keep pushing himself to get through the program and also continue to exercise at home. Will continue to monitor for progress.  Patient has completed 26 sessions maintaining his weight since last 30 day review. He continues to do well in the program with progression. He says he does feel stronger and continues to feel like he is building muscle strength but his SOB has not improved. He says he has more energy and feels like he is able to be more active. He continues to exercise at home. He is pleased with his progress in the program. Will continue to monitor for progress.    Expected Outcomes  Patient will continue to attend sessions and complete the program meeting his personal goals.  Patient will continue to attend sessions and complete the program meeting his personal goals.  Patient will continue to attend sessions and complete the program meeting both his personal and program goals.  Patient will continue to attend sessions and complete the program meeting both his personal and program goals.       Core Components/Risk Factors/Patient Goals at  Discharge (Final Review):  Goals and Risk Factor Review - 09/02/19 0735      Core Components/Risk Factors/Patient Goals Review   Personal Goals Review  Weight Management/Obesity;Improve shortness of breath with ADL's   Decrease SOB; be more active.   Review  Patient has completed 26 sessions maintaining his weight since last 30 day review. He continues to do well in the program with progression. He says he does feel stronger and continues to feel like he is building muscle strength but his SOB has not improved. He says he has more energy and feels like he is able to be more active. He continues to exercise at home. He is pleased with his progress in the program. Will continue to monitor for progress.    Expected Outcomes  Patient will continue to attend sessions and complete the program meeting both his personal and program goals.       ITP Comments: ITP Comments    Row Name 05/28/19 1117           ITP Comments  Patient is eager to get started.          Comments: ITP REVIEW Pt is making expected progress toward pulmonary rehab goals after completing 26 sessions.  Recommend continued exercise, life style modification, education, and utilization of breathing techniques to increase stamina and strength and decrease shortness of breath with exertion.

## 2019-09-06 ENCOUNTER — Other Ambulatory Visit: Payer: Self-pay | Admitting: *Deleted

## 2019-09-06 DIAGNOSIS — Z87891 Personal history of nicotine dependence: Secondary | ICD-10-CM

## 2019-09-06 NOTE — Telephone Encounter (Signed)
Linds, do you know if this was completed? thanks

## 2019-09-07 ENCOUNTER — Other Ambulatory Visit: Payer: Self-pay

## 2019-09-07 ENCOUNTER — Ambulatory Visit: Payer: BC Managed Care – PPO | Attending: Internal Medicine

## 2019-09-07 ENCOUNTER — Encounter (HOSPITAL_COMMUNITY)
Admission: RE | Admit: 2019-09-07 | Discharge: 2019-09-07 | Disposition: A | Discharge status: Home / Self Care | Payer: BC Managed Care – PPO | Source: Ambulatory Visit | Attending: Emergency Medicine | Admitting: Emergency Medicine

## 2019-09-07 DIAGNOSIS — Z23 Encounter for immunization: Secondary | ICD-10-CM

## 2019-09-07 DIAGNOSIS — J439 Emphysema, unspecified: Secondary | ICD-10-CM | POA: Diagnosis not present

## 2019-09-07 NOTE — Progress Notes (Signed)
   Covid-19 Vaccination Clinic  Name:  Peter Mahoney    MRN: [id number] DOB: 1960-07-05  09/07/2019  [name] was observed post Covid-19 immunization for 15 minutes without incident. He was provided with Vaccine Information Sheet and instruction to access the V-Safe system.   [name] was instructed to call 911 with any severe reactions post vaccine: Marland Kitchen Difficulty breathing  . Swelling of face and throat  . A fast heartbeat  . A bad rash all over body  . Dizziness and weakness   Immunizations Administered    Name Date Dose VIS Date Route   Pfizer COVID-19 Vaccine 09/07/2019  1:33 PM 0.3 mL 06/16/2018 Intramuscular   Manufacturer: [place]   Lot: [id number]   [place]: [id number]

## 2019-09-07 NOTE — Progress Notes (Signed)
Daily Session Note  Patient Details  Name: EVERITT WENNER MRN: [id number] Date of Birth: 06/13/1960 Referring Provider:     PULMONARY REHAB OTHER RESP ORIENTATION from 05/28/2019 in [place]  Referring Provider  Dr. Lamonte Sakai      Encounter Date: 09/07/2019  Check In: Session Check In - 09/07/19 1201      Check-In   Supervising physician immediately available to respond to emergencies  See telemetry face sheet for immediately available MD    Location  [facility]    Staff Present  Russella Dar, MS, EP, [facility], Exercise Physiologist;Other    Virtual Visit  No    Medication changes reported      No    Fall or balance concerns reported     No    Tobacco Cessation  No Change    Warm-up and Cool-down  Performed as group-led instruction    Resistance Training Performed  Yes    VAD Patient?  No    PAD/SET Patient?  No      Pain Assessment   Currently in Pain?  No/denies    Pain Score  0-No pain    Multiple Pain Sites  No       Capillary Blood Glucose: No results found for this or any previous visit (from the past 24 hour(s)).    Social History   Tobacco Use  Smoking Status Former Smoker  . Packs/day: 1.00  . Years: 35.00  . Pack years: 35.00  . Types: Cigarettes  . Quit date: 11/01/2015  . Years since quitting: 3.8  Smokeless Tobacco Never Used    Goals Met:  Proper associated with RPD/PD & O2 Sat Independence with exercise equipment Improved SOB with ADL's Using PLB without cueing & demonstrates good technique Exercise tolerated well Personal goals reviewed No report of cardiac concerns or symptoms Strength training completed today  Goals Unmet:  Not Applicable  Comments: Check out:1145   Dr. Kate Sable is Medical Director for [facility] and Pulmonary Rehab.

## 2019-09-07 NOTE — Telephone Encounter (Signed)
RB has not been back in the office to sign paperwork. He is back in the office on 09/09/19, will give folder to him to sign at this time.

## 2019-09-09 ENCOUNTER — Ambulatory Visit (INDEPENDENT_AMBULATORY_CARE_PROVIDER_SITE_OTHER): Payer: BC Managed Care – PPO | Admitting: Emergency Medicine

## 2019-09-09 ENCOUNTER — Encounter (HOSPITAL_COMMUNITY): Payer: BC Managed Care – PPO

## 2019-09-09 ENCOUNTER — Encounter: Payer: Self-pay | Admitting: Emergency Medicine

## 2019-09-09 ENCOUNTER — Other Ambulatory Visit: Payer: Self-pay

## 2019-09-09 DIAGNOSIS — R0600 Dyspnea, unspecified: Secondary | ICD-10-CM | POA: Diagnosis not present

## 2019-09-09 DIAGNOSIS — R0609 Other forms of dyspnea: Secondary | ICD-10-CM

## 2019-09-09 DIAGNOSIS — J431 Panlobular emphysema: Secondary | ICD-10-CM | POA: Diagnosis not present

## 2019-09-09 DIAGNOSIS — F17210 Nicotine dependence, cigarettes, uncomplicated: Secondary | ICD-10-CM | POA: Diagnosis not present

## 2019-09-09 NOTE — Progress Notes (Signed)
Subjective:    Patient ID: AUDRA MCSWEENEY, male    DOB: 03-15-1961, 59 y.o.   MRN: [id number]  HPI 59 year old former smoker (45 pack years) who has been followed by Dr. Luan Pulling for COPD.  Also with pulmonary nodular disease for which he underwent serial CT scans of the chest 10/30/2015 through 08/05/2017, stable.  Quantitative alpha-1 antitrypsin done 05/30/2017 was 161 (normal), no genotype tested.  Most recent pulmonary function testing reviewed by me from 05/20/2018 shows normal airflows and volumes but curve to his flow volume loop.  He also has a significant defect in diffusion capacity.  He is doing Pulmonary Rehab at [facility]. Has not had any desaturations.   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   He has exertional SOB, also sometimes at rest. He has tried Trelegy, Anoro without effect. He has albuterol to use prn. Uses rarely and has had minimal effect. He has some daily cough, has phlegm, clear, happens every day. Last prednisone was a year ago.   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  He also has a history of pulmonary nodular disease it was deemed benign based on serial scans, now participates in the lung cancer screening program >> should repeat June '21.  At his last visit we decided to retry bronchodilators, started Spiriva Respimat.  He has been participating in the cardiopulmonary rehab, feels that his breathing is about the same. He stopped the Spiriva at 6 weeks. He doesn't notice improvement with SABA either.  He has had covid vaccines Being followed by cardiology for sinus brady  Review of Systems As above  Past Medical History:  Diagnosis Date  . Dental crown present   . GERD (gastroesophageal reflux disease)   . Post-operative infection 09/2011   left leg  . Seasonal allergies   . Wound infection after surgery, left  lateral leg 10/08/2011     Family History  Problem Relation Age of Onset  . Heart Problems Mother   . Stroke Mother   . Pulmonary fibrosis Mother   . Cancer - Lung Father        smoked  . Heart failure Maternal Grandmother   . Emphysema Maternal Grandfather        "his whole family had Emphysema"  . Diabetes Paternal Grandmother   . Cancer Paternal Grandfather      Social History   Socioeconomic History  . Marital status: Married    Spouse name: [name]  . Number of children: 2  . Years of education: 32  . Highest education level: Not on file  Occupational History    Employer: [employer]    Comment: [employer]  Tobacco Use  . Smoking status: Former Smoker    Packs/day: 1.00    Years: 35.00    Pack years: 35.00    Types: Cigarettes    Quit date: 11/01/2015    Years since quitting: 3.8  . Smokeless tobacco: Never Used  Substance and Sexual Activity  . Alcohol use: Yes    Alcohol/week: 2.0 standard drinks    Types: 2 Standard drinks or equivalent per week    Comment: occasionally  . Drug use: No  . Sexual activity: Not on file  Other Topics Concern  . Not on file  Social History Narrative   Married father of 2, Jon Gills  of 3. Wife's name is [name].   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   Currently smokes a pack a day, and drinks maybe 2-3 alcohol beverages a week.         Epworth Sleepiness Scale      Score: 9      --I seem to be losing my sex drive   --I feel stressed and lack motivation   --I have COPD   --I have been told that I snore   --I am overweight or am gaining weight   --I awake feeling not rested   Social Determinants of Health   Financial Resource Strain:   . Difficulty of Paying Living Expenses:   Food Insecurity:   . Worried About Charity fundraiser in the Last Year:   . Arboriculturist in the Last Year:   Transportation Needs:   . Film/video editor (Medical):   Marland Kitchen  Lack of Transportation (Non-Medical):   Physical Activity:   . Days of Exercise per Week:   . Minutes of Exercise per Session:   Stress:   . Feeling of Stress :   Social Connections:   . Frequency of Communication with Friends and Family:   . Frequency of Social Gatherings with Friends and Family:   . Attends Religious Services:   . Active Member of Clubs or Organizations:   . Attends Archivist Meetings:   Marland Kitchen Marital Status:   Intimate Partner Violence:   . Fear of Current or Ex-Partner:   . Emotionally Abused:   Marland Kitchen Physically Abused:   . Sexually Abused:   [employer] Hotel manager  Cigarette company   Allergies  Allergen Reactions  . Dilaudid [Hydromorphone Hcl] Other (See Comments)    Urinary retention- says with most opioids taken  . Doxycycline Nausea And Vomiting  . Oxycodone      Outpatient Medications Prior to Visit  Medication Sig Dispense Refill  . albuterol (VENTOLIN HFA) 108 (90 Base) MCG/ACT inhaler Inhale 2 puffs into the lungs every 4 (four) hours as needed for wheezing or shortness of breath. 18 g 5  . diazepam (VALIUM) 10 MG tablet 10 mg at bedtime.     . RABEprazole (ACIPHEX) 20 MG tablet Take 20 mg by mouth in the morning and at bedtime.     . sildenafil (VIAGRA) 100 MG tablet Take 1 tablet (100 mg total) by mouth daily as needed for erectile dysfunction. 10 tablet 6  . tamsulosin (FLOMAX) 0.4 MG CAPS capsule Take 0.4 mg by mouth at bedtime.     . Tiotropium Bromide Monohydrate (SPIRIVA RESPIMAT) 2.5 MCG/ACT AERS Inhale 2 puffs into the lungs daily. (Patient not taking: Reported on 09/09/2019) 8 g 0   No facility-administered medications prior to visit.         Objective:   Physical Exam Vitals:   09/09/19 0951  BP: 130/62  Pulse: 72  Temp: 98.3 F (36.8 C)  TempSrc: Temporal  SpO2: 98%  Weight: 192 lb 9.6 oz (87.4 kg)  Height: 5\' 10"  (1.778 m)   Gen: Pleasant, well-nourished, in no distress,  normal affect  ENT: No lesions,  mouth  clear,  oropharynx clear, no postnasal drip  Neck: No JVD, no stridor  Lungs: No use of accessory muscles, distant, no crackles or wheezing on normal respiration, no wheeze on forced expiration  Cardiovascular: RRR, heart sounds normal, no murmur or gallops, no  peripheral edem  Musculoskeletal: No deformities, no cyanosis or clubbing  Neuro: alert, awake, non focal  Skin: Warm, no lesions or rash       Assessment & Plan:  COPD with emphysema ([place]) His COPD is an emphysematous phenotype as noted by his imaging, minimal obstruction on spirometry.  He has a significantly decreased DLCO but he does not desaturate with exertion yet.  We will need to follow him for this and start oxygen when indicated.  We talked about possibly starting a LABA today since he has had some side effects with Spiriva have urinary retention.  For now we will hold off, continue albuterol as needed and reassess next time.  He will continue to push his exercise tolerance, finish cardiopulmonary rehab.  DOE (dyspnea on exertion) He does have some coronary disease, has sinus bradycardia that it is unclear whether this is symptomatic.  May need further work-up depending on persistence of his dyspnea.  He is not responding to standard therapy for COPD or emphysema.  Cigarette smoker Former smoker.  He is due for lung cancer screening in June 2021.  We will follow up the results with him  Baltazar Apo, MD, PhD 09/09/2019, 10:23 AM [facility] [phone] or if no answer [phone]

## 2019-09-09 NOTE — Telephone Encounter (Signed)
Completed 09/09/19

## 2019-09-09 NOTE — Assessment & Plan Note (Signed)
He does have some coronary disease, has sinus bradycardia that it is unclear whether this is symptomatic.  May need further work-up depending on persistence of his dyspnea.  He is not responding to standard therapy for COPD or emphysema.

## 2019-09-09 NOTE — Assessment & Plan Note (Signed)
Former smoker.  He is due for lung cancer screening in June 2021.  We will follow up the results with him

## 2019-09-09 NOTE — Patient Instructions (Addendum)
Get your Lung cancer CT scan in June as planned. Keep albuterol available to use 2 puffs up to every 4 hours if needed for shortness of breath, chest tightness, wheezing.  We will consider adding back an every-day inhaler at some point in the future.  COVID vaccine up to date Follow with Dr Lamonte Sakai in 6 months or sooner if you have any problems

## 2019-09-09 NOTE — Assessment & Plan Note (Signed)
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  He has a significantly decreased DLCO but he does not desaturate with exertion yet.  We will need to follow him for this and start oxygen when indicated.  We talked about possibly starting a LABA today since he has had some side effects with Spiriva have urinary retention.  For now we will hold off, continue albuterol as needed and reassess next time.  He will continue to push his exercise tolerance, finish cardiopulmonary rehab.

## 2019-09-13 ENCOUNTER — Telehealth: Payer: Self-pay | Admitting: Emergency Medicine

## 2019-09-13 NOTE — Telephone Encounter (Signed)
Paperwork completed and ready for pick up -pr

## 2019-09-14 ENCOUNTER — Encounter (HOSPITAL_COMMUNITY)
Admission: RE | Admit: 2019-09-14 | Discharge: 2019-09-14 | Disposition: A | Discharge status: Home / Self Care | Payer: BC Managed Care – PPO | Source: Ambulatory Visit | Attending: Emergency Medicine | Admitting: Emergency Medicine

## 2019-09-14 ENCOUNTER — Other Ambulatory Visit: Payer: Self-pay

## 2019-09-14 DIAGNOSIS — J439 Emphysema, unspecified: Secondary | ICD-10-CM | POA: Diagnosis not present

## 2019-09-14 NOTE — Progress Notes (Signed)
Daily Session Note  Patient Details  Name: Peter Mahoney MRN: [id number] Date of Birth: [birth year] Referring Provider:     PULMONARY REHAB OTHER RESP ORIENTATION from 05/28/2019 in [place]  Referring Provider  Dr. Lamonte Sakai      Encounter Date: 09/14/2019  Check In: Session Check In - 09/14/19 1045      Check-In   Supervising physician immediately available to respond to emergencies  See telemetry face sheet for immediately available MD    Location  [facility]    Staff Present  Russella Dar, MS, EP, [facility], Exercise Physiologist;Sonika Levins, Exercise Physiologist;Other    Virtual Visit  No    Medication changes reported      No    Fall or balance concerns reported     No    Tobacco Cessation  No Change    Warm-up and Cool-down  Performed as group-led instruction    Resistance Training Performed  Yes    VAD Patient?  No    PAD/SET Patient?  No      Pain Assessment   Currently in Pain?  No/denies    Pain Score  0-No pain    Multiple Pain Sites  No       Capillary Blood Glucose: No results found for this or any previous visit (from the past 24 hour(s)).    Social History   Tobacco Use  Smoking Status Former Smoker  . Packs/day: 1.00  . Years: 35.00  . Pack years: 35.00  . Types: Cigarettes  . Quit date: 11/01/2015  . Years since quitting: 3.8  Smokeless Tobacco Never Used    Goals Met:  Proper associated with RPD/PD & O2 Sat Independence with exercise equipment Improved SOB with ADL's Using PLB without cueing & demonstrates good technique Exercise tolerated well No report of cardiac concerns or symptoms Strength training completed today  Goals Unmet:  Not Applicable  Comments: Check out: 1145   Dr. Kathie Dike is Medical Director for [facility] Pulmonary Rehab.

## 2019-09-16 ENCOUNTER — Other Ambulatory Visit: Payer: Self-pay

## 2019-09-16 ENCOUNTER — Encounter (HOSPITAL_COMMUNITY)
Admission: RE | Admit: 2019-09-16 | Discharge: 2019-09-16 | Disposition: A | Discharge status: Home / Self Care | Payer: BC Managed Care – PPO | Source: Ambulatory Visit | Attending: Emergency Medicine | Admitting: Emergency Medicine

## 2019-09-16 DIAGNOSIS — J439 Emphysema, unspecified: Secondary | ICD-10-CM | POA: Diagnosis not present

## 2019-09-16 NOTE — Progress Notes (Signed)
Daily Session Note  Patient Details  Name: Peter Mahoney MRN: [id number] Date of Birth: 1960/05/25 Referring Provider:     PULMONARY REHAB OTHER RESP ORIENTATION from 05/28/2019 in [place]  Referring Provider  Dr. Lamonte Sakai      Encounter Date: 09/16/2019  Check In: Session Check In - 09/16/19 1127      Check-In   Supervising physician immediately available to respond to emergencies  See telemetry face sheet for immediately available MD    Location  [facility]    Staff Present  Russella Dar, MS, EP, [facility], Exercise Physiologist;Vaniece Hatchett, Exercise Physiologist    Virtual Visit  No    Medication changes reported      No    Fall or balance concerns reported     No    Tobacco Cessation  No Change    Warm-up and Cool-down  Performed as group-led instruction    Resistance Training Performed  No    VAD Patient?  No      Pain Assessment   Currently in Pain?  No/denies    Pain Score  0-No pain    Multiple Pain Sites  No       Capillary Blood Glucose: No results found for this or any previous visit (from the past 24 hour(s)).    Social History   Tobacco Use  Smoking Status Former Smoker  . Packs/day: 1.00  . Years: 35.00  . Pack years: 35.00  . Types: Cigarettes  . Quit date: 11/01/2015  . Years since quitting: 3.8  Smokeless Tobacco Never Used    Goals Met:  Proper associated with RPD/PD & O2 Sat Independence with exercise equipment Improved SOB with ADL's Using PLB without cueing & demonstrates good technique Exercise tolerated well Personal goals reviewed No report of cardiac concerns or symptoms Strength training completed today  Goals Unmet:  Not Applicable  Comments: Check out: 1145   Dr. Kathie Dike is Medical Director for [facility] Pulmonary Rehab.

## 2019-09-21 ENCOUNTER — Encounter (HOSPITAL_COMMUNITY): Payer: BC Managed Care – PPO

## 2019-09-23 ENCOUNTER — Encounter (HOSPITAL_COMMUNITY)
Admission: RE | Admit: 2019-09-23 | Discharge: 2019-09-23 | Disposition: A | Discharge status: Home / Self Care | Payer: BC Managed Care – PPO | Source: Ambulatory Visit | Attending: Emergency Medicine | Admitting: Emergency Medicine

## 2019-09-23 ENCOUNTER — Other Ambulatory Visit: Payer: Self-pay

## 2019-09-23 DIAGNOSIS — J439 Emphysema, unspecified: Secondary | ICD-10-CM | POA: Insufficient documentation

## 2019-09-23 NOTE — Progress Notes (Signed)
Daily Session Note  Patient Details  Name: Peter Mahoney MRN: [id number] Date of Birth: June 10, 1960 Referring Provider:     PULMONARY REHAB OTHER RESP ORIENTATION from 05/28/2019 in [place]  Referring Provider  Dr. Lamonte Sakai      Encounter Date: 09/23/2019  Check In: Session Check In - 09/23/19 1045      Check-In   Supervising physician immediately available to respond to emergencies  See telemetry face sheet for immediately available MD    Location  [facility]    Staff Present  Russella Dar, MS, EP, [facility], Exercise Physiologist;Vaniece Hatchett, Exercise Physiologist;Other    Virtual Visit  No    Medication changes reported      No    Fall or balance concerns reported     No    Tobacco Cessation  No Change    Warm-up and Cool-down  Performed as group-led instruction    Resistance Training Performed  Yes    VAD Patient?  No    PAD/SET Patient?  No      Pain Assessment   Currently in Pain?  No/denies    Multiple Pain Sites  No       Capillary Blood Glucose: No results found for this or any previous visit (from the past 24 hour(s)).    Social History   Tobacco Use  Smoking Status Former Smoker  . Packs/day: 1.00  . Years: 35.00  . Pack years: 35.00  . Types: Cigarettes  . Quit date: 11/01/2015  . Years since quitting: 3.8  Smokeless Tobacco Never Used    Goals Met:  Proper associated with RPD/PD & O2 Sat Independence with exercise equipment Improved SOB with ADL's Using PLB without cueing & demonstrates good technique Exercise tolerated well Personal goals reviewed No report of cardiac concerns or symptoms Strength training completed today  Goals Unmet:  Not Applicable  Comments: check out 11:45   Dr. Kate Sable is Medical Director for [place] and Pulmonary Rehab.

## 2019-09-28 ENCOUNTER — Encounter (HOSPITAL_COMMUNITY)
Admission: RE | Admit: 2019-09-28 | Discharge: 2019-09-28 | Disposition: A | Discharge status: Home / Self Care | Payer: BC Managed Care – PPO | Source: Ambulatory Visit | Attending: Emergency Medicine | Admitting: Emergency Medicine

## 2019-09-28 ENCOUNTER — Other Ambulatory Visit: Payer: Self-pay

## 2019-09-28 DIAGNOSIS — J439 Emphysema, unspecified: Secondary | ICD-10-CM | POA: Diagnosis not present

## 2019-09-28 NOTE — Progress Notes (Signed)
Daily Session Note  Patient Details  Name: Peter Mahoney MRN: [id number]  Date of Birth: 04-21-1961 Referring Provider:     PULMONARY REHAB OTHER RESP ORIENTATION from 05/28/2019 in [place]  Referring Provider  Dr. Lamonte Sakai      Encounter Date: 09/28/2019  Check In: Session Check In - 09/28/19 1045      Check-In   Supervising physician immediately available to respond to emergencies  See telemetry face sheet for immediately available MD    Location  [facility]    Staff Present  Algis Downs, Exercise Physiologist;Debra Wynetta Emery, RN, BSN;Other    Virtual Visit  No    Medication changes reported      No    Fall or balance concerns reported     No    Tobacco Cessation  No Change    Warm-up and Cool-down  Performed as group-led instruction    Resistance Training Performed  Yes    VAD Patient?  No    PAD/SET Patient?  No      Pain Assessment   Currently in Pain?  No/denies    Pain Score  0-No pain    Multiple Pain Sites  No       Capillary Blood Glucose: No results found for this or any previous visit (from the past 24 hour(s)).    Social History   Tobacco Use  Smoking Status Former Smoker  . Packs/day: 1.00  . Years: 35.00  . Pack years: 35.00  . Types: Cigarettes  . Quit date: 11/01/2015  . Years since quitting: 3.9  Smokeless Tobacco Never Used    Goals Met:  Proper associated with RPD/PD & O2 Sat Independence with exercise equipment Improved SOB with ADL's Using PLB without cueing & demonstrates good technique Exercise tolerated well Personal goals reviewed No report of cardiac concerns or symptoms Strength training completed today  Goals Unmet:  Not Applicable  Comments: check out 11:45   Dr. Kathie Dike is Medical Director for [facility] Pulmonary Rehab.

## 2019-09-29 NOTE — Progress Notes (Signed)
Pulmonary Individual Treatment Plan  Patient Details  Name: Peter Mahoney MRN: [id number] Date of Birth: [birth year] Referring Provider:     PULMONARY REHAB OTHER RESP ORIENTATION from 05/28/2019 in [facility]  Referring Provider  Dr. Lamonte Sakai      Initial Encounter Date:    PULMONARY [place] from 05/28/2019 in [place]  Date  05/28/19      Visit Diagnosis: Pulmonary emphysema, unspecified emphysema type ([place])  Patient's Home Medications on Admission:   Current Outpatient Medications:  .  albuterol (VENTOLIN HFA) 108 (90 Base) MCG/ACT inhaler, Inhale 2 puffs into the lungs every 4 (four) hours as needed for wheezing or shortness of breath., Disp: 18 g, Rfl: 5 .  diazepam (VALIUM) 10 MG tablet, 10 mg at bedtime. , Disp: , Rfl:  .  RABEprazole (ACIPHEX) 20 MG tablet, Take 20 mg by mouth in the morning and at bedtime. , Disp: , Rfl:  .  sildenafil (VIAGRA) 100 MG tablet, Take 1 tablet (100 mg total) by mouth daily as needed for erectile dysfunction., Disp: 10 tablet, Rfl: 6 .  tamsulosin (FLOMAX) 0.4 MG CAPS capsule, Take 0.4 mg by mouth at bedtime. , Disp: , Rfl:  .  Tiotropium Bromide Monohydrate (SPIRIVA RESPIMAT) 2.5 MCG/ACT AERS, Inhale 2 puffs into the lungs daily. (Patient not taking: Reported on 09/09/2019), Disp: 8 g, Rfl: 0  Past Medical History: Past Medical History:  Diagnosis Date  . Dental crown present   . GERD (gastroesophageal reflux disease)   . Post-operative infection 09/2011   left leg  . Seasonal allergies   . Wound infection after surgery, left lateral leg 10/08/2011    Tobacco Use: Social History   Tobacco Use  Smoking Status Former Smoker  . Packs/day: 1.00  . Years: 35.00  . Pack years: 35.00  . Types: Cigarettes  . Quit date: 11/01/2015  . Years since quitting: 3.9  Smokeless Tobacco Never Used    Labs: Recent Review Flowsheet Data    There is no flowsheet data to display.       Capillary Blood Glucose: No results found for: GLUCAP   Pulmonary Assessment Scores: Pulmonary Assessment Scores    Row Name 05/28/19 1152         ADL UCSD   ADL Phase  Entry     SOB Score total  105     Rest  4     Walk  14     Stairs  5     Bath  5     Dress  4     Shop  4       CAT Score   CAT Score  31       mMRC Score   mMRC Score  5       UCSD: Self-administered rating of dyspnea associated with activities of daily living (ADLs) 6-point scale (0 = "not at all" to 5 = "maximal or unable to do because of breathlessness")  Scoring Scores range from 0 to 120.  Minimally important difference is 5 units  CAT: CAT can identify the health impairment of COPD patients and is better correlated with disease progression.  CAT has a scoring range of zero to 40. The CAT score is classified into four groups of low (less than 10), medium (10 - 20), high (21-30) and very high (31-40) based on the impact level of disease on health status. A CAT score over 10 suggests significant symptoms.  A  worsening CAT score could be explained by an exacerbation, poor medication adherence, poor inhaler technique, or progression of COPD or comorbid conditions.  CAT MCID is 2 points  mMRC: mMRC (Modified Medical Research Council) Dyspnea Scale is used to assess the degree of baseline functional disability in patients of respiratory disease due to dyspnea. No minimal important difference is established. A decrease in score of 1 point or greater is considered a positive change.   Pulmonary Function Assessment: Pulmonary Function Assessment - 05/28/19 1151      Pulmonary Function Tests   FVC%  92 %    FEV1%  91 %    FEV1/FVC Ratio  99    DLCO%  48 %      Initial Spirometry Results   FVC%  92 %    FEV1%  91 %    FEV1/FVC Ratio  99      Post Bronchodilator Spirometry Results   FVC%  91 %    FEV1%  88 %    FEV1/FVC Ratio  97      Breath   Bilateral Breath Sounds  Clear    Shortness  of Breath  Yes       Exercise Target Goals: Exercise Program Goal: Individual exercise prescription set using results from initial 6 min walk test and THRR while considering  patient's activity barriers and safety.   Exercise Prescription Goal: Initial exercise prescription builds to 30-45 minutes a day of aerobic activity, 2-3 days per week.  Home exercise guidelines will be given to patient during program as part of exercise prescription that the participant will acknowledge.  Activity Barriers & Risk Stratification: Activity Barriers & Cardiac Risk Stratification - 05/28/19 1124      Activity Barriers & Cardiac Risk Stratification   Activity Barriers  Shortness of Breath;Other (comment)   knee surgeryx4, plantar fascitis (L) foot   Cardiac Risk Stratification  Low       6 Minute Walk: 6 Minute Walk    Row Name 05/28/19 1121         6 Minute Walk   Phase  Initial     Distance  1400 feet     Walk Time  6 minutes     # of Rest Breaks  0     MPH  2.65     METS  3.03     RPE  14     Perceived Dyspnea   16     VO2 Peak  13.49     Symptoms  Yes (comment)     Comments  SOB     Resting HR  66 bpm     Resting BP  120/80     Resting Oxygen Saturation   97 %     Exercise Oxygen Saturation  during 6 min walk  94 %     Max Ex. HR  99 bpm     Max Ex. BP  124/84     2 Minute Post BP  120/80        Oxygen Initial Assessment: Oxygen Initial Assessment - 05/28/19 1149      Home Oxygen   Home Oxygen Device  None    Sleep Oxygen Prescription  None    Home Exercise Oxygen Prescription  None    Home at Rest Exercise Oxygen Prescription  None      Initial 6 min Walk   Oxygen Used  None      Program Oxygen Prescription   Program Oxygen Prescription  None      Intervention   Short Term Goals  To learn and understand importance of monitoring SPO2 with pulse oximeter and demonstrate accurate use of the pulse oximeter.;To learn and understand importance of maintaining oxygen  saturations>88%;To learn and demonstrate proper pursed lip breathing techniques or other breathing techniques.    Long  Term Goals  Verbalizes importance of monitoring SPO2 with pulse oximeter and return demonstration;Maintenance of O2 saturations>88%;Exhibits proper breathing techniques, such as pursed lip breathing or other method taught during program session       Oxygen Re-Evaluation: Oxygen Re-Evaluation    Row Name 06/24/19 1256 07/16/19 0757 08/11/19 1612 09/02/19 0735 09/29/19 1331     Program Oxygen Prescription   Program Oxygen Prescription  None  None  None  None  None     Home Oxygen   Home Oxygen Device  None  None  None  None  None   Sleep Oxygen Prescription  None  None  None  None  None   Home Exercise Oxygen Prescription  None  None  None  None  None   Home at Rest Exercise Oxygen Prescription  None  None  None  None  None   Compliance with Home Oxygen Use  --  Yes  -- NA  Yes  Yes     Goals/Expected Outcomes   Short Term Goals  To learn and understand importance of monitoring SPO2 with pulse oximeter and demonstrate accurate use of the pulse oximeter.;To learn and understand importance of maintaining oxygen saturations>88%;To learn and demonstrate proper pursed lip breathing techniques or other breathing techniques.  To learn and understand importance of monitoring SPO2 with pulse oximeter and demonstrate accurate use of the pulse oximeter.;To learn and understand importance of maintaining oxygen saturations>88%;To learn and demonstrate proper pursed lip breathing techniques or other breathing techniques.  To learn and understand importance of monitoring SPO2 with pulse oximeter and demonstrate accurate use of the pulse oximeter.;To learn and understand importance of maintaining oxygen saturations>88%;To learn and demonstrate proper pursed lip breathing techniques or other breathing techniques.;To learn and exhibit compliance with exercise, home and travel O2 prescription   To learn and understand importance of monitoring SPO2 with pulse oximeter and demonstrate accurate use of the pulse oximeter.;To learn and demonstrate proper pursed lip breathing techniques or other breathing techniques.;To learn and exhibit compliance with exercise, home and travel O2 prescription;To learn and understand importance of maintaining oxygen saturations>88%  To learn and understand importance of monitoring SPO2 with pulse oximeter and demonstrate accurate use of the pulse oximeter.;To learn and demonstrate proper pursed lip breathing techniques or other breathing techniques.;To learn and exhibit compliance with exercise, home and travel O2 prescription;To learn and understand importance of maintaining oxygen saturations>88%   Long  Term Goals  Verbalizes importance of monitoring SPO2 with pulse oximeter and return demonstration;Maintenance of O2 saturations>88%;Exhibits proper breathing techniques, such as pursed lip breathing or other method taught during program session  Verbalizes importance of monitoring SPO2 with pulse oximeter and return demonstration;Maintenance of O2 saturations>88%;Exhibits proper breathing techniques, such as pursed lip breathing or other method taught during program session  Verbalizes importance of monitoring SPO2 with pulse oximeter and return demonstration;Maintenance of O2 saturations>88%;Exhibits proper breathing techniques, such as pursed lip breathing or other method taught during program session;Exhibits compliance with exercise, home and travel O2 prescription;Compliance with respiratory medication  Verbalizes importance of monitoring SPO2 with pulse oximeter and return demonstration;Maintenance of O2 saturations>88%;Exhibits proper breathing techniques, such as pursed lip breathing  or other method taught during program session;Exhibits compliance with exercise, home and travel O2 prescription;Compliance with respiratory medication  Verbalizes importance of  monitoring SPO2 with pulse oximeter and return demonstration;Maintenance of O2 saturations>88%;Exhibits proper breathing techniques, such as pursed lip breathing or other method taught during program session;Exhibits compliance with exercise, home and travel O2 prescription;Compliance with respiratory medication   Comments  Patiient is ablet to verbalize the importance of maintaining his O2 saturation >88% and the importance of monitoring his O2 saturation. He exhibits proper pursed lip breathing techniques in class.  Patiient is ablet to verbalize the importance of maintaining his O2 saturation >88% and the importance of monitoring his O2 saturation and is albe to properly demonstrate usage of pulse oximeter. He exhibits proper pursed lip breathing techniques in class.  Patiient is able to verbalize the importance of maintaining his O2 saturation >88% and the importance of monitoring his O2 saturation and is albe to properly demonstrate usage of pulse oximeter. He exhibits proper pursed lip breathing techniques in class.He is also complaint with his home exercise prescription and all medications. Will continue to monitor for progress.  Patiient is able to verbalize the importance of maintaining his O2 saturation >88% and the importance of monitoring his O2 saturation and is albe to properly demonstrate usage of pulse oximeter. He exhibits proper pursed lip breathing techniques in class.He is also complaint with his home exercise prescription and all medications. Will continue to monitor for progress.  Patiient is able to verbalize the importance of maintaining his O2 saturation >88% and the importance of monitoring his O2 saturation and is albe to properly demonstrate usage of pulse oximeter. He exhibits proper pursed lip breathing techniques in class.He is also complaint with his home exercise prescription and all medications. Will continue to monitor for progress.   Goals/Expected Outcomes  Patient will  continue to meet both his short and long term goals.  Patient will continue to meet both his short and long term goals.  Patient will continue to meet both his short and long term goals.  Patient will continue to meet both his short and long term goals.  Patient will continue to meet both his short and long term goals.      Oxygen Discharge (Final Oxygen Re-Evaluation): Oxygen Re-Evaluation - 09/29/19 1331      Program Oxygen Prescription   Program Oxygen Prescription  None      Home Oxygen   Home Oxygen Device  None    Sleep Oxygen Prescription  None    Home Exercise Oxygen Prescription  None    Home at Rest Exercise Oxygen Prescription  None    Compliance with Home Oxygen Use  Yes      Goals/Expected Outcomes   Short Term Goals  To learn and understand importance of monitoring SPO2 with pulse oximeter and demonstrate accurate use of the pulse oximeter.;To learn and demonstrate proper pursed lip breathing techniques or other breathing techniques.;To learn and exhibit compliance with exercise, home and travel O2 prescription;To learn and understand importance of maintaining oxygen saturations>88%    Long  Term Goals  Verbalizes importance of monitoring SPO2 with pulse oximeter and return demonstration;Maintenance of O2 saturations>88%;Exhibits proper breathing techniques, such as pursed lip breathing or other method taught during program session;Exhibits compliance with exercise, home and travel O2 prescription;Compliance with respiratory medication    Comments  Patiient is able to verbalize the importance of maintaining his O2 saturation >88% and the importance of monitoring his O2 saturation and is  albe to properly demonstrate usage of pulse oximeter. He exhibits proper pursed lip breathing techniques in class.He is also complaint with his home exercise prescription and all medications. Will continue to monitor for progress.    Goals/Expected Outcomes  Patient will continue to meet both his  short and long term goals.       Initial Exercise Prescription: Initial Exercise Prescription - 05/28/19 1100      Date of Initial Exercise RX and Referring Provider   Date  05/28/19    Referring Provider  Dr. Lamonte Sakai    Expected Discharge Date  08/25/19      Treadmill   MPH  1.3    Grade  0    Minutes  17    METs  1.99      Recumbant Elliptical   Level  1    RPM  45    Watts  64    Minutes  20    METs  3.5      Prescription Details   Frequency (times per week)  2    Duration  Progress to 30 minutes of continuous aerobic without signs/symptoms of physical distress      Intensity   THRR 40-80% of Max Heartrate  [phone]    Ratings of Perceived Exertion  11-13    Perceived Dyspnea  0-4      Progression   Progression  Continue to progress workloads to maintain intensity without signs/symptoms of physical distress.      Resistance Training   Training Prescription  Yes    Weight  1    Reps  10-15       Perform Capillary Blood Glucose checks as needed.  Exercise Prescription Changes:  Exercise Prescription Changes    Row Name 06/14/19 1500 06/23/19 1400 07/15/19 1500 08/12/19 0800 09/06/19 1300     Response to Exercise   Blood Pressure (Admit)  142/88  148/82  110/60  122/70  104/62   Blood Pressure (Exercise)  148/82  138/80  118/70  132/78  110/50   Blood Pressure (Exit)  118/72  104/70  100/60  132/78  90/60   Heart Rate (Admit)  82 bpm  86 bpm  55 bpm  77 bpm  68 bpm   Heart Rate (Exercise)  105 bpm  118 bpm  83 bpm  89 bpm  82 bpm   Heart Rate (Exit)  86 bpm  81 bpm  63 bpm  76 bpm  68 bpm   Oxygen Saturation (Admit)  98 %  97 %  96 %  95 %  95 %   Oxygen Saturation (Exercise)  95 %  95 %  95 %  96 %  98 %   Oxygen Saturation (Exit)  97 %  97 %  95 %  95 %  95 %   Rating of Perceived Exertion (Exercise)  12  12  12  12  11    Perceived Dyspnea (Exercise)  13  11  12  12  13    Symptoms  SOB  SOB  SOB  SOB  SOB   Comments  has not been 2wks  --  --  --  --    Duration  Continue with 30 min of aerobic exercise without signs/symptoms of physical distress.  Continue with 30 min of aerobic exercise without signs/symptoms of physical distress.  Continue with 30 min of aerobic exercise without signs/symptoms of physical distress.  Continue with 30 min of aerobic exercise without signs/symptoms of  physical distress.  Progress to 30 minutes of  aerobic without signs/symptoms of physical distress   Intensity  THRR unchanged  THRR unchanged  THRR New  THRR unchanged  THRR unchanged     Progression   Progression  Continue to progress workloads to maintain intensity without signs/symptoms of physical distress.  Continue to progress workloads to maintain intensity without signs/symptoms of physical distress.  Continue to progress workloads to maintain intensity without signs/symptoms of physical distress.  Continue to progress workloads to maintain intensity without signs/symptoms of physical distress.  Continue to progress workloads to maintain intensity without signs/symptoms of physical distress.   Average METs  2.64  2.54  2.73  2.87  2.96     Resistance Training   Training Prescription  Yes  Yes  Yes  Yes  Yes   Weight  1  2  2  3  4    Reps  10-15  10-15  10-15  10-15  10-15   Time  10 Minutes  10 Minutes  10 Minutes  10 Minutes  10 Minutes     Oxygen   Liters  0  0  0  --  0     Treadmill   MPH  1.3  1.4  1.5  1.6  1.7   Grade  0  0  0  0  0   Minutes  17  17  17  17  17    METs  1.99  2.07  2.07  2.22  2.3     Recumbant Elliptical   Level  1  1  1  2  3    RPM  50  67  34  19  37   TKWIO  97  35  32  99  24   Minutes  22  22  22  22  22    METs  3.3  3.1  3.4  3.6  3.7     Home Exercise Plan   Plans to continue exercise at  Home (comment)  Home (comment)  Home (comment)  Home (comment)  Home (comment)   Frequency  Add 3 additional days to program exercise sessions.  Add 3 additional days to program exercise sessions.  Add 3 additional days to  program exercise sessions.  Add 3 additional days to program exercise sessions.  Add 3 additional days to program exercise sessions.   Initial Home Exercises Provided  05/28/19  05/28/19  05/28/19  05/28/19  05/28/19   Row Name 09/17/19 1600             Response to Exercise   Blood Pressure (Admit)  108/68       Blood Pressure (Exercise)  104/58       Blood Pressure (Exit)  92/60       Heart Rate (Admit)  65 bpm       Heart Rate (Exercise)  79 bpm       Heart Rate (Exit)  72 bpm       Oxygen Saturation (Admit)  95 %       Oxygen Saturation (Exercise)  95 %       Oxygen Saturation (Exit)  95 %       Rating of Perceived Exertion (Exercise)  12       Perceived Dyspnea (Exercise)  14       Symptoms  SOB       Duration  Continue with 30 min of aerobic exercise without signs/symptoms of physical distress.  Intensity  THRR unchanged         Progression   Progression  Continue to progress workloads to maintain intensity without signs/symptoms of physical distress.       Average METs  3         Resistance Training   Training Prescription  Yes       Weight  4       Reps  10-15       Time  10 Minutes         Oxygen   Liters  0         Treadmill   MPH  1.7       Grade  0       Minutes  17       METs  2.3         Recumbant Elliptical   Level  3       RPM  50       Watts  66       Minutes  22       METs  3.7         Home Exercise Plan   Plans to continue exercise at  Home (comment)       Frequency  Add 3 additional days to program exercise sessions.       Initial Home Exercises Provided  05/28/19          Exercise Comments:  Exercise Comments    Row Name 06/14/19 1520 06/23/19 1528 07/15/19 1552 08/12/19 0832 09/06/19 1406   Exercise Comments  Patient is doing well for such a short period of time. Will continue to progress his workloads.  Patient is doing well with his first 2 week. Will continue to progress as tolerated.  We will continue to progress as tolerated.   Patient has been consistent with his attendance. He is doing well. Even though he is still SOB it is making him stronger. Patient does enjoy coming to class.  Patient is coming regularly and has a great attitude. Patient tries very hard in order to improve. Will continue to progress as tolerated.   [place] Name 09/17/19 1609           Exercise Comments  Patient is very motivated despite his difficulty to breath. Patient shared that coming is making him stronger and able to do more of his ADLs. We will continue to progress as tolerated to continue his trend of getting stronger.          Exercise Goals and Review:  Exercise Goals    Row Name 05/28/19 1141             Exercise Goals   Increase Physical Activity  Yes       Intervention  Provide advice, education, support and counseling about physical activity/exercise needs.;Develop an individualized exercise prescription for aerobic and resistive training based on initial evaluation findings, risk stratification, comorbidities and participant's personal goals.       Expected Outcomes  Short Term: Attend rehab on a regular basis to increase amount of physical activity.;Long Term: Add in home exercise to make exercise part of routine and to increase amount of physical activity.       Increase Strength and Stamina  Yes       Intervention  Provide advice, education, support and counseling about physical activity/exercise needs.;Develop an individualized exercise prescription for aerobic and resistive training based on initial evaluation findings, risk stratification, comorbidities and participant's personal goals.  Expected Outcomes  Short Term: Perform resistance training exercises routinely during rehab and add in resistance training at home;Long Term: Improve cardiorespiratory fitness, muscular endurance and strength as measured by increased METs and functional capacity (6MWT)       Able to understand and use rate of perceived exertion (RPE)  scale  Yes       Intervention  Provide education and explanation on how to use RPE scale       Expected Outcomes  Short Term: Able to use RPE daily in rehab to express subjective intensity level;Long Term:  Able to use RPE to guide intensity level when exercising independently       Able to understand and use Dyspnea scale  Yes       Intervention  Provide education and explanation on how to use Dyspnea scale       Expected Outcomes  Short Term: Able to use Dyspnea scale daily in rehab to express subjective sense of shortness of breath during exertion;Long Term: Able to use Dyspnea scale to guide intensity level when exercising independently       Knowledge and understanding of Target Heart Rate Range (THRR)  Yes       Intervention  Provide education and explanation of THRR including how the numbers were predicted and where they are located for reference       Expected Outcomes  Short Term: Able to use daily as guideline for intensity in rehab;Long Term: Able to use THRR to govern intensity when exercising independently       Able to check pulse independently  Yes       Intervention  Provide education and demonstration on how to check pulse in carotid and radial arteries.;Review the importance of being able to check your own pulse for safety during independent exercise       Expected Outcomes  Short Term: Able to explain why pulse checking is important during independent exercise;Long Term: Able to check pulse independently and accurately       Understanding of Exercise Prescription  Yes       Intervention  Provide education, explanation, and written materials on patient's individual exercise prescription       Expected Outcomes  Short Term: Able to explain program exercise prescription;Long Term: Able to explain home exercise prescription to exercise independently          Exercise Goals Re-Evaluation : Exercise Goals Re-Evaluation    Row Name 06/14/19 1516 06/23/19 1525 07/15/19 1549 08/12/19  0831 09/06/19 1403     Exercise Goal Re-Evaluation   Exercise Goals Review  Increase Physical Activity;Increase Strength and Stamina  Increase Physical Activity;Increase Strength and Stamina  Increase Physical Activity;Increase Strength and Stamina  Increase Physical Activity;Increase Strength and Stamina  Increase Physical Activity;Increase Strength and Stamina   Comments  Patient has just started program. This is his 4th session. He is already making progress with walking on the TM and using the seated ellipitcal. We will progress his workload in the next 2 weeks.  Patient has done well. This is his first progression. Will monitor how he tolerates it and his level of SOB. He has improved walking on the TM. He can now do the entire 17 minutes without stopping. He tolerates the seated elliptical well.  Patient is progressing slow but steady. He is still SOB and has a persistent cough. He has a doctors appointment schduled with his pulmonologist for next week. His goals are to decrease his SOB, and become active.  Main goals are to help with his SOB and become more active.  Patient wants to improve shortness of breathe and be more active. Patient is becoming more active even though he is still short or breath.   Expected Outcomes  His main goals are to reduce his SOB and to become more active. We will monitor his this and ask patient if he feels he is reaching his expected goals.  Reach in expected goals of decreasing his SOB, being able to become more active.  To reach his personal goals  Reach his personal goals.  To reach expected goals   Row Name 09/20/19 1315             Exercise Goal Re-Evaluation   Exercise Goals Review  Increase Physical Activity;Increase Strength and Stamina       Comments  Patients goals are to have less SOB and to be more active. Patient is doing well in the program. He is still SOB but he does not let that stop him from doing the equipment. He does his workouts well inspite  of his SOB. His workloads are increasing with each session. We will continue to monitor his progress in an effort to help him reach his goals.       Expected Outcomes  To reach his expected goals.          Discharge Exercise Prescription (Final Exercise Prescription Changes): Exercise Prescription Changes - 09/17/19 1600      Response to Exercise   Blood Pressure (Admit)  108/68    Blood Pressure (Exercise)  104/58    Blood Pressure (Exit)  92/60    Heart Rate (Admit)  65 bpm    Heart Rate (Exercise)  79 bpm    Heart Rate (Exit)  72 bpm    Oxygen Saturation (Admit)  95 %    Oxygen Saturation (Exercise)  95 %    Oxygen Saturation (Exit)  95 %    Rating of Perceived Exertion (Exercise)  12    Perceived Dyspnea (Exercise)  14    Symptoms  SOB    Duration  Continue with 30 min of aerobic exercise without signs/symptoms of physical distress.    Intensity  THRR unchanged      Progression   Progression  Continue to progress workloads to maintain intensity without signs/symptoms of physical distress.    Average METs  3      Resistance Training   Training Prescription  Yes    Weight  4    Reps  10-15    Time  10 Minutes      Oxygen   Liters  0      Treadmill   MPH  1.7    Grade  0    Minutes  17    METs  2.3      Recumbant Elliptical   Level  3    RPM  50    Watts  66    Minutes  22    METs  3.7      Home Exercise Plan   Plans to continue exercise at  Home (comment)    Frequency  Add 3 additional days to program exercise sessions.    Initial Home Exercises Provided  05/28/19       Nutrition:  Target Goals: Understanding of nutrition guidelines, daily intake of sodium 1500mg , cholesterol 200mg , calories 30% from fat and 7% or less from saturated fats, daily to have 5 or more servings of fruits and  vegetables.  Biometrics: Pre Biometrics - 05/28/19 1144      Pre Biometrics   Height  5\' 10"  (1.778 m)    Weight  85.6 kg    Waist Circumference  38 inches    Hip  Circumference  36 inches    Waist to Hip Ratio  1.06 %    BMI (Calculated)  27.08    Triceps Skinfold  8 mm    % Body Fat  23.2 %    Grip Strength  31.6 kg    Flexibility  0 in    Single Leg Stand  60 seconds        Nutrition Therapy Plan and Nutrition Goals: Nutrition Therapy & Goals - 09/29/19 1331      Personal Nutrition Goals   Comments  We continue to work on scheduling patients for the RD class. Patient scored 66 on his medificts diet assessment score. We have provided handouts for education. He says he is eating less red meat and fried foods and eating more salads and more chicken. Will continue to monitor for progress.      Intervention Plan   Intervention  Nutrition handout(s) given to patient.       Nutrition Assessments: Nutrition Assessments - 05/28/19 1158      MEDFICTS Scores   Pre Score  66       Nutrition Goals Re-Evaluation:   Nutrition Goals Discharge (Final Nutrition Goals Re-Evaluation):   Psychosocial: Target Goals: Acknowledge presence or absence of significant depression and/or stress, maximize coping skills, provide positive support system. Participant is able to verbalize types and ability to use techniques and skills needed for reducing stress and depression.  Initial Review & Psychosocial Screening: Initial Psych Review & Screening - 05/28/19 1154      Initial Review   Current issues with  None Identified      Family Dynamics   Good Support System?  Yes    Concerns  No support system      Barriers   Psychosocial barriers to participate in program  There are no identifiable barriers or psychosocial needs.      Screening Interventions   Interventions  Encouraged to exercise    Expected Outcomes  Short Term goal: Identification and review with participant of any Quality of Life or Depression concerns found by scoring the questionnaire.;Long Term goal: The participant improves quality of Life and PHQ9 Scores as seen by post scores and/or  verbalization of changes       Quality of Life Scores: Quality of Life - 05/28/19 1155      Quality of Life Scores   Health/Function Pre  12.38 %    Socioeconomic Pre  30 %    Psych/Spiritual Pre  27.43 %    Family Pre  24 %    GLOBAL Pre  20.29 %      Scores of 19 and below usually indicate a poorer quality of life in these areas.  A difference of  2-3 points is a clinically meaningful difference.  A difference of 2-3 points in the total score of the Quality of Life Index has been associated with significant improvement in overall quality of life, self-image, physical symptoms, and general health in studies assessing change in quality of life.   PHQ-9: Recent Review Flowsheet Data    Depression screen [facility] 2/9 05/28/2019   Decreased Interest 0   Down, Depressed, Hopeless 0   PHQ - 2 Score 0   Altered sleeping 3  Tired, decreased energy 2   Change in appetite 0   Feeling bad or failure about yourself  0   Trouble concentrating 0   Moving slowly or fidgety/restless 0   Suicidal thoughts 0   PHQ-9 Score 5   Difficult doing work/chores Very difficult     Interpretation of Total Score  Total Score Depression Severity:  1-4 = Minimal depression, 5-9 = Mild depression, 10-14 = Moderate depression, 15-19 = Moderately severe depression, 20-27 = Severe depression   Psychosocial Evaluation and Intervention: Psychosocial Evaluation - 05/28/19 1155      Psychosocial Evaluation & Interventions   Interventions  Encouraged to exercise with the program and follow exercise prescription    Continue Psychosocial Services   No Follow up required       Psychosocial Re-Evaluation: Psychosocial Re-Evaluation    [place] Name 06/24/19 1300 07/16/19 0803 08/11/19 1618 09/02/19 0735 09/29/19 1331     Psychosocial Re-Evaluation   Current issues with  None Identified  None Identified  None Identified  None Identified  None Identified   Comments  Patient's initial QOL was 20.29 and PHQ-9 was 5. He  continues to take Valium 10 mg daily. He has no psychosocial issues identified.  Patient's initial QOL was 20.29 and PHQ-9 was 5. He continues to take Valium 10 mg daily. He has no psychosocial issues identified.  Patient's initial QOL was 20.29 and PHQ-9 was 5. He continues to take Valium 10 mg daily. He has no psychosocial issues identified.  Patient's initial QOL was 20.29 and PHQ-9 was 5. He continues to take Valium 10 mg daily. He has no psychosocial issues identified.  Patient's initial QOL was 20.29 and PHQ-9 was 5. He continues to take Valium 10 mg daily. He has no psychosocial issues identified.   Expected Outcomes  Patient will have no psychosocial issues identified at discharge.  Patient will have no psychosocial issues identified at discharge.  Patient will have no psychosocial issues identified at discharge.  Patient will have no psychosocial issues identified at discharge.  Patient will have no psychosocial issues identified at discharge.   Interventions  Encouraged to attend Pulmonary Rehabilitation for the exercise;Relaxation education;Stress management education  Encouraged to attend Pulmonary Rehabilitation for the exercise;Relaxation education;Stress management education  Encouraged to attend Pulmonary Rehabilitation for the exercise;Relaxation education;Stress management education  Encouraged to attend Pulmonary Rehabilitation for the exercise;Relaxation education;Stress management education  Encouraged to attend Pulmonary Rehabilitation for the exercise;Relaxation education;Stress management education   Continue Psychosocial Services   No Follow up required  No Follow up required  No Follow up required  No Follow up required  No Follow up required      Psychosocial Discharge (Final Psychosocial Re-Evaluation): Psychosocial Re-Evaluation - 09/29/19 1331      Psychosocial Re-Evaluation   Current issues with  None Identified    Comments  Patient's initial QOL was 20.29 and PHQ-9 was 5.  He continues to take Valium 10 mg daily. He has no psychosocial issues identified.    Expected Outcomes  Patient will have no psychosocial issues identified at discharge.    Interventions  Encouraged to attend Pulmonary Rehabilitation for the exercise;Relaxation education;Stress management education    Continue Psychosocial Services   No Follow up required        Education: Education Goals: Education classes will be provided on a weekly basis, covering required topics. Participant will state understanding/return demonstration of topics presented.  Learning Barriers/Preferences: Learning Barriers/Preferences - 05/28/19 1159      Learning Barriers/Preferences  Learning Barriers  None    Learning Preferences  Group Instruction;Skilled Demonstration       Education Topics: How Lungs Work and Diseases: - Discuss the anatomy of the lungs and diseases that can affect the lungs, such as COPD.   PULMONARY REHAB OTHER RESPIRATORY from 09/16/2019 in [place]  Date  07/29/19  Educator  D. Coad  Instruction Review Code  2- Demonstrated Understanding      Exercise: -Discuss the importance of exercise, FITT principles of exercise, normal and abnormal responses to exercise, and how to exercise safely.   Environmental Irritants: -Discuss types of environmental irritants and how to limit exposure to environmental irritants.   PULMONARY REHAB OTHER RESPIRATORY from 09/16/2019 in [place]  Date  08/05/19  Educator  DC  Instruction Review Code  2- Demonstrated Understanding      Meds/Inhalers and oxygen: - Discuss respiratory medications, definition of an inhaler and oxygen, and the proper way to use an inhaler and oxygen.   PULMONARY REHAB OTHER RESPIRATORY from 09/16/2019 in [place]  Date  08/12/19  Educator  D. Wynetta Emery      Energy Saving Techniques: - Discuss methods to conserve energy and decrease shortness  of breath when performing activities of daily living.    PULMONARY REHAB OTHER RESPIRATORY from 09/16/2019 in [place]  Date  07/15/19  Educator  Etheleen Mayhew  Instruction Review Code  2- Demonstrated Understanding      Bronchial Hygiene / Breathing Techniques: - Discuss breathing mechanics, pursed-lip breathing technique,  proper posture, effective ways to clear airways, and other functional breathing techniques   Cleaning Equipment: - Provides group verbal and written instruction about the health risks of elevated stress, cause of high stress, and healthy ways to reduce stress.   PULMONARY REHAB OTHER RESPIRATORY from 09/16/2019 in [place]  Date  08/26/19  Educator  DC  Instruction Review Code  2- Demonstrated Understanding      Nutrition I: Fats: - Discuss the types of cholesterol, what cholesterol does to the body, and how cholesterol levels can be controlled.   Nutrition II: Labels: -Discuss the different components of food labels and how to read food labels.   PULMONARY REHAB OTHER RESPIRATORY from 09/16/2019 in [place]  Date  06/17/19  Educator  DWynetta Emery  Instruction Review Code  2- Demonstrated Understanding      Respiratory Infections: - Discuss the signs and symptoms of respiratory infections, ways to prevent respiratory infections, and the importance of seeking medical treatment when having a respiratory infection.   Stress I: Signs and Symptoms: - Discuss the causes of stress, how stress may lead to anxiety and depression, and ways to limit stress.   PULMONARY REHAB OTHER RESPIRATORY from 09/16/2019 in [place]  Date  07/01/19  Educator  D.[place] Giammona  Instruction Review Code  2- Demonstrated Understanding      Stress II: Relaxation: -Discuss relaxation techniques to limit stress.   PULMONARY REHAB OTHER RESPIRATORY from 09/16/2019 in [place]  Date  06/24/19  Educator  DC  Instruction Review Code  2- Demonstrated Understanding      Oxygen for Home/Travel: - Discuss how to prepare for travel when on oxygen and proper ways to transport and store oxygen to ensure safety.   PULMONARY REHAB OTHER RESPIRATORY from 09/16/2019 in [place]  Date  07/15/19  Educator  Etheleen Mayhew  Instruction Review  Code  2- Demonstrated Understanding      Knowledge Questionnaire Score: Knowledge Questionnaire Score - 05/28/19 1204      Knowledge Questionnaire Score   Pre Score  16/18       Core Components/Risk Factors/Patient Goals at Admission: Personal Goals and Risk Factors at Admission - 05/28/19 1204      Core Components/Risk Factors/Patient Goals on Admission    Weight Management  Weight Maintenance    Personal Goal Other  Yes    Personal Goal  Help with SOB, Be more active    Intervention  Attend PR 2 x week and supplement with 3 x week of TM waling and stationary bike riding at home.    Expected Outcomes  Reach goals       Core Components/Risk Factors/Patient Goals Review:  Goals and Risk Factor Review    Row Name 06/24/19 1259 07/16/19 0758 08/11/19 1614 09/02/19 0735 09/29/19 1334     Core Components/Risk Factors/Patient Goals Review   Personal Goals Review  Weight Management/Obesity;Improve shortness of breath with ADL's Decrease SOB; be more active.  Weight Management/Obesity;Improve shortness of breath with ADL's Decrease SOB; be more active.  Weight Management/Obesity;Improve shortness of breath with ADL's Decrease SOB; be more active.  Weight Management/Obesity;Improve shortness of breath with ADL's Decrease SOB; be more active.  Weight Management/Obesity;Improve shortness of breath with ADL's Decrease SOB; be more active.   Review  Patient has completed 7 sessions gaining 6 lbs since his orientation visit. He is doing well in the program with some progressions. He says he has not  noticed an improvement in his SOB yet but is enjoying the program. Will continue to monitor for progress.  Patient has completed 14 sessions gaining 1 lb since last 30 day review. He continues to do well in the program with progression. He has recently been having more congestion and coughing up more phlegm. His pulmonologist retired and he has finally found a MD to go to. He has an appointment in April. He does feel like he is making progress with getting stronger but his SOB has not changed. He still howerer, feels the program is a benefit and hopes his new MD will have some new treatment options. Will continue to  monitor for progress.  Patient has completed 21 sessions losing 3 lbs since his last 30 day review. He continues to do well in the program with progression. He states he has built strength in his muscles but his SOB has not changed. He does feel stronger. He says he is going to keep pushing himself to get through the program and also continue to exercise at home. Will continue to monitor for progress.  Patient has completed 26 sessions maintaining his weight since last 30 day review. He continues to do well in the program with progression. He says he does feel stronger and continues to feel like he is building muscle strength but his SOB has not improved. He says he has more energy and feels like he is able to be more active. He continues to exercise at home. He is pleased with his progress in the program. Will continue to monitor for progress.  Patient has completed 33 sessions maintaining his weight since last 30 day review. He continues to do well in the program with progression. He says he continues to feel stronger and feels like his muscle strength is improving. He is still exercising at home and is pleased with the progress he has made in the progam. Will continue  to monitor for progress.   Expected Outcomes  Patient will continue to attend sessions and complete the program meeting his personal  goals.  Patient will continue to attend sessions and complete the program meeting his personal goals.  Patient will continue to attend sessions and complete the program meeting both his personal and program goals.  Patient will continue to attend sessions and complete the program meeting both his personal and program goals.  Patient will continue to attend sessions and complete the program meeting both his personal and program goals.      Core Components/Risk Factors/Patient Goals at Discharge (Final Review):  Goals and Risk Factor Review - 09/29/19 1334      Core Components/Risk Factors/Patient Goals Review   Personal Goals Review  Weight Management/Obesity;Improve shortness of breath with ADL's   Decrease SOB; be more active.   Review  Patient has completed 33 sessions maintaining his weight since last 30 day review. He continues to do well in the program with progression. He says he continues to feel stronger and feels like his muscle strength is improving. He is still exercising at home and is pleased with the progress he has made in the progam. Will continue to monitor for progress.    Expected Outcomes  Patient will continue to attend sessions and complete the program meeting both his personal and program goals.       ITP Comments: ITP Comments    Row Name 05/28/19 1117           ITP Comments  Patient is eager to get started.          Comments: ITP REVIEW Pt is making expected progress toward pulmonary rehab goals after completing 33 sessions. Recommend continued exercise, life style modification, education, and utilization of breathing techniques to increase stamina and strength and decrease shortness of breath with exertion.

## 2019-09-30 ENCOUNTER — Encounter (HOSPITAL_COMMUNITY)
Admission: RE | Admit: 2019-09-30 | Discharge: 2019-09-30 | Disposition: A | Discharge status: Home / Self Care | Payer: BC Managed Care – PPO | Source: Ambulatory Visit | Attending: Emergency Medicine | Admitting: Emergency Medicine

## 2019-09-30 ENCOUNTER — Other Ambulatory Visit: Payer: Self-pay

## 2019-09-30 VITALS — Wt 190.0 lb

## 2019-09-30 DIAGNOSIS — J439 Emphysema, unspecified: Secondary | ICD-10-CM | POA: Diagnosis not present

## 2019-09-30 NOTE — Progress Notes (Signed)
Daily Session Note  Patient Details  Name: Peter Mahoney MRN: [id number] Date of Birth: 1960/06/06 Referring Provider:     PULMONARY REHAB OTHER RESP ORIENTATION from 05/28/2019 in [place]  Referring Provider Dr. Lamonte Sakai      Encounter Date: 09/30/2019  Check In:  Session Check In - 09/30/19 1045      Check-In   Supervising physician immediately available to respond to emergencies See telemetry face sheet for immediately available MD    Location [facility]    Staff Present Algis Downs, Exercise Physiologist;Michell Giuliano Wynetta Emery, RN, BSN    Virtual Visit No    Medication changes reported     No    Fall or balance concerns reported    No    Tobacco Cessation No Change    Warm-up and Cool-down Performed as group-led instruction    Resistance Training Performed Yes    VAD Patient? No    PAD/SET Patient? No      Pain Assessment   Currently in Pain? No/denies    Pain Score 0-No pain    Multiple Pain Sites No           Capillary Blood Glucose: No results found for this or any previous visit (from the past 24 hour(s)).    Social History   Tobacco Use  Smoking Status Former Smoker  . Packs/day: 1.00  . Years: 35.00  . Pack years: 35.00  . Types: Cigarettes  . Quit date: 11/01/2015  . Years since quitting: 3.9  Smokeless Tobacco Never Used    Goals Met:  Proper associated with RPD/PD & O2 Sat Independence with exercise equipment Improved SOB with ADL's Using PLB without cueing & demonstrates good technique Exercise tolerated well No report of cardiac concerns or symptoms Strength training completed today  Goals Unmet:  Not Applicable  Comments: Check out 1145.   Dr. Kathie Dike is Medical Director for [facility].

## 2019-10-05 ENCOUNTER — Other Ambulatory Visit: Payer: Self-pay

## 2019-10-05 ENCOUNTER — Encounter (HOSPITAL_COMMUNITY)
Admission: RE | Admit: 2019-10-05 | Discharge: 2019-10-05 | Disposition: A | Discharge status: Home / Self Care | Payer: BC Managed Care – PPO | Source: Ambulatory Visit | Attending: Emergency Medicine | Admitting: Emergency Medicine

## 2019-10-05 DIAGNOSIS — J439 Emphysema, unspecified: Secondary | ICD-10-CM | POA: Diagnosis not present

## 2019-10-05 NOTE — Progress Notes (Signed)
Daily Session Note  Patient Details  Name: Peter Mahoney MRN: [id number] Date of Birth: 1961/02/06 Referring Provider:     PULMONARY REHAB OTHER RESP ORIENTATION from 05/28/2019 in [place]  Referring Provider Dr. Lamonte Sakai      Encounter Date: 10/05/2019  Check In:  Session Check In - 10/05/19 1042      Check-In   Supervising physician immediately available to respond to emergencies See telemetry face sheet for immediately available MD    Location [facility]    Staff Present Algis Downs, Exercise Physiologist;Other   Geanie Cooley RN   Virtual Visit No    Medication changes reported     No    Fall or balance concerns reported    No    Tobacco Cessation No Change    Warm-up and Cool-down Performed as group-led instruction    Resistance Training Performed Yes    VAD Patient? No    PAD/SET Patient? No      Pain Assessment   Currently in Pain? No/denies    Pain Score 0-No pain    Multiple Pain Sites No           Capillary Blood Glucose: No results found for this or any previous visit (from the past 24 hour(s)).    Social History   Tobacco Use  Smoking Status Former Smoker  . Packs/day: 1.00  . Years: 35.00  . Pack years: 35.00  . Types: Cigarettes  . Quit date: 11/01/2015  . Years since quitting: 3.9  Smokeless Tobacco Never Used    Goals Met:  Proper associated with RPD/PD & O2 Sat Independence with exercise equipment Improved SOB with ADL's Exercise tolerated well Personal goals reviewed No report of cardiac concerns or symptoms Strength training completed today  Goals Unmet:  Not Applicable  Comments: check out 11:45   Dr. Kathie Dike is Medical Director for [facility].

## 2019-10-06 ENCOUNTER — Ambulatory Visit (INDEPENDENT_AMBULATORY_CARE_PROVIDER_SITE_OTHER): Payer: BC Managed Care – PPO | Admitting: Acute Care

## 2019-10-06 ENCOUNTER — Encounter: Payer: Self-pay | Admitting: Acute Care

## 2019-10-06 ENCOUNTER — Ambulatory Visit: Payer: BC Managed Care – PPO

## 2019-10-06 ENCOUNTER — Ambulatory Visit (HOSPITAL_COMMUNITY)
Admission: RE | Admit: 2019-10-06 | Discharge: 2019-10-06 | Disposition: A | Discharge status: Home / Self Care | Payer: BC Managed Care – PPO | Source: Ambulatory Visit | Attending: Acute Care | Admitting: Acute Care

## 2019-10-06 ENCOUNTER — Other Ambulatory Visit: Payer: Self-pay

## 2019-10-06 DIAGNOSIS — Z87891 Personal history of nicotine dependence: Secondary | ICD-10-CM | POA: Diagnosis not present

## 2019-10-06 DIAGNOSIS — Z122 Encounter for screening for malignant neoplasm of respiratory organs: Secondary | ICD-10-CM | POA: Insufficient documentation

## 2019-10-06 NOTE — Progress Notes (Signed)
Shared Decision Making Visit Lung Cancer Screening Program [phone])   Eligibility:  Age 59 y.o.  Pack Years Smoking History Calculation 35 pack year smoking history (# packs/per year x # years smoked)  Recent History of coughing up blood  no  Unexplained weight loss? no ( >Than 15 pounds within the last 6 months )  Prior History Lung / other cancer no (Diagnosis within the last 5 years already requiring surveillance chest CT Scans).  Smoking Status Former Smoker  Former Smokers: Years since quit: 3 years  Quit Date: 11/01/2015  Visit Components:  Discussion included one or more decision making aids. yes  Discussion included risk/benefits of screening. yes  Discussion included potential follow up diagnostic testing for abnormal scans. yes  Discussion included meaning and risk of over diagnosis. yes  Discussion included meaning and risk of False Positives. yes  Discussion included meaning of total radiation exposure. yes  Counseling Included:  Importance of adherence to annual lung cancer LDCT screening. yes  Impact of comorbidities on ability to participate in the program. yes  Ability and willingness to under diagnostic treatment. yes  Smoking Cessation Counseling:  Current Smokers:   Discussed importance of smoking cessation. yes  Information about tobacco cessation classes and interventions provided to patient. yes  Patient provided with "ticket" for LDCT Scan. yes  Symptomatic Patient. no  Counseling  Diagnosis Code: Tobacco Use Z72.0  Asymptomatic Patient yes  Counseling (Intermediate counseling: > three minutes counseling) D7412  Former Smokers:   Discussed the importance of maintaining cigarette abstinence. yes  Diagnosis Code: Personal History of Nicotine Dependence. I78.676  Information about tobacco cessation classes and interventions provided to patient. Yes  Patient provided with "ticket" for LDCT Scan. yes  Written Order for Lung  Cancer Screening with LDCT placed in Epic. Yes (CT Chest Lung Cancer Screening Low Dose W/O CM) [id number] Z12.2-Screening of respiratory organs Z87.891-Personal history of nicotine dependence   I spent 25 minutes of face to face time with Peter Mahoney discussing the risks and benefits of lung cancer screening. We viewed a power point together that explained in detail the above noted topics. We took the time to pause the power point at intervals to allow for questions to be asked and answered to ensure understanding. We discussed that he had taken the single most powerful action possible to decrease his risk of developing lung cancer when he quit smoking. I counseled him to remain smoke free, and to contact me if he ever had the desire to smoke again so that I can provide resources and tools to help support the effort to remain smoke free. We discussed the time and location of the scan, and that either  Doroteo Glassman RN or I will call with the results within  24-48 hours of receiving them. He has my card and contact information in the event he needs to speak with me, in addition to a copy of the power point we reviewed as a resource. He verbalized understanding of all of the above and had no further questions upon leaving the office.     I explained to the patient that there has been a high incidence of coronary artery disease noted on these exams. I explained that this is a non-gated exam therefore degree or severity cannot be determined. This patient is not on statin therapy. I have asked the patient to follow-up with their PCP regarding any incidental finding of coronary artery disease and management with diet or medication as they feel is  clinically indicated. The patient verbalized understanding of the above and had no further questions.    Magdalen Spatz, NP  10/06/2019

## 2019-10-07 ENCOUNTER — Encounter (HOSPITAL_COMMUNITY)
Admission: RE | Admit: 2019-10-07 | Discharge: 2019-10-07 | Disposition: A | Discharge status: Home / Self Care | Payer: BC Managed Care – PPO | Source: Ambulatory Visit | Attending: Emergency Medicine | Admitting: Emergency Medicine

## 2019-10-07 VITALS — Ht 70.0 in | Wt 187.8 lb

## 2019-10-07 DIAGNOSIS — J439 Emphysema, unspecified: Secondary | ICD-10-CM

## 2019-10-07 NOTE — Progress Notes (Signed)
Please call patient and let them  know their  low dose Ct was read as a Lung RADS 2: nodules that are benign in appearance and behavior with a very low likelihood of becoming a clinically active cancer due to size or lack of growth. Recommendation per radiology is for a repeat LDCT in 12 months. .Please let them  know we will order and schedule their  annual screening scan for 09/2020 Please let them  know there was notation of CAD on their  scan.  Please remind the patient  that this is a non-gated exam therefore degree or severity of disease  cannot be determined. Please have them  follow up with their PCP regarding potential risk factor modification, dietary therapy or pharmacologic therapy if clinically indicated. Pt.  is  not currently on statin therapy. Please place order for annual  screening scan for  09/2020 and fax results to PCP. Thanks so much. 

## 2019-10-07 NOTE — Progress Notes (Signed)
Daily Session Note  Patient Details  Name: Peter Mahoney MRN: [id number] Date of Birth: July 22, 1960 Referring Provider:     PULMONARY REHAB OTHER RESP ORIENTATION from 05/28/2019 in [place]  Referring Provider Dr. Lamonte Sakai      Encounter Date: 10/07/2019  Check In:  Session Check In - 10/07/19 1100      Check-In   Supervising physician immediately available to respond to emergencies See telemetry face sheet for immediately available MD    Location [facility]    Staff Present Algis Downs, Exercise Physiologist;Faythe Heitzenrater Kris Mouton, MS, ACSM-CEP, Exercise Physiologist;Debra Wynetta Emery, RN, BSN    Virtual Visit No    Medication changes reported     No    Fall or balance concerns reported    No    Tobacco Cessation No Change    Warm-up and Cool-down Performed as group-led instruction    Resistance Training Performed Yes    VAD Patient? No    PAD/SET Patient? No      Pain Assessment   Currently in Pain? No/denies    Pain Score 0-No pain    Multiple Pain Sites No           Capillary Blood Glucose: No results found for this or any previous visit (from the past 24 hour(s)).    Social History   Tobacco Use  Smoking Status Former Smoker  . Packs/day: 1.00  . Years: 35.00  . Pack years: 35.00  . Types: Cigarettes  . Start date: 14  . Quit date: 11/01/2015  . Years since quitting: 3.9  Smokeless Tobacco Never Used    Goals Met:  Independence with exercise equipment Exercise tolerated well Strength training completed today  Goals Unmet:  Not Applicable  Comments: checkout time is 1145   Dr. Kate Sable is Medical Director for [facility].

## 2019-10-08 ENCOUNTER — Other Ambulatory Visit: Payer: Self-pay | Admitting: *Deleted

## 2019-10-08 DIAGNOSIS — Z87891 Personal history of nicotine dependence: Secondary | ICD-10-CM

## 2019-10-11 NOTE — Progress Notes (Signed)
Pulmonary Individual Treatment Plan  Patient Details  Name: Peter Mahoney MRN: [id number] Date of Birth: 13-Jun-1960 Referring Provider:     PULMONARY REHAB OTHER RESP ORIENTATION from 05/28/2019 in [place]  Referring Provider Dr. Lamonte Sakai      Initial Encounter Date:    PULMONARY [place] from 05/28/2019 in [place]  Date 05/28/19      Visit Diagnosis: Pulmonary emphysema, unspecified emphysema type ([place])  Patient's Home Medications on Admission:   Current Outpatient Medications:  .  albuterol (VENTOLIN HFA) 108 (90 Base) MCG/ACT inhaler, Inhale 2 puffs into the lungs every 4 (four) hours as needed for wheezing or shortness of breath., Disp: 18 g, Rfl: 5 .  diazepam (VALIUM) 10 MG tablet, 10 mg at bedtime. , Disp: , Rfl:  .  RABEprazole (ACIPHEX) 20 MG tablet, Take 20 mg by mouth in the morning and at bedtime. , Disp: , Rfl:  .  sildenafil (VIAGRA) 100 MG tablet, Take 1 tablet (100 mg total) by mouth daily as needed for erectile dysfunction., Disp: 10 tablet, Rfl: 6 .  tamsulosin (FLOMAX) 0.4 MG CAPS capsule, Take 0.4 mg by mouth at bedtime. , Disp: , Rfl:  .  Tiotropium Bromide Monohydrate (SPIRIVA RESPIMAT) 2.5 MCG/ACT AERS, Inhale 2 puffs into the lungs daily. (Patient not taking: Reported on 09/09/2019), Disp: 8 g, Rfl: 0  Past Medical History: Past Medical History:  Diagnosis Date  . Dental crown present   . GERD (gastroesophageal reflux disease)   . Post-operative infection 09/2011   left leg  . Seasonal allergies   . Wound infection after surgery, left lateral leg 10/08/2011    Tobacco Use: Social History   Tobacco Use  Smoking Status Former Smoker  . Packs/day: 1.00  . Years: 35.00  . Pack years: 35.00  . Types: Cigarettes  . Start date: 48  . Quit date: 11/01/2015  . Years since quitting: 3.9  Smokeless Tobacco Never Used    Labs: Recent Review Flowsheet Data   There is no flowsheet data to  display.     Capillary Blood Glucose: No results found for: GLUCAP   Pulmonary Assessment Scores:  Pulmonary Assessment Scores    Row Name 05/28/19 1152 10/07/19 1204 10/11/19 1458     ADL [facility]   ADL Phase Entry Exit Exit   SOB Score total 105 -- 97   Rest 4 -- 3   Walk 14 -- 8   Stairs 5 -- 5   Bath 5 -- 4   Dress 4 -- 4   Shop 4 -- 4     CAT Score   CAT Score 31 -- 31     mMRC Score   mMRC Score _0 [facility]: Self-administered rating of dyspnea associated with activities of daily living (ADLs) 6-point scale (0 = "not at all" to 5 = "maximal or unable to do because of breathlessness")  Scoring Scores range from 0 to 120.  Minimally important difference is 5 units  CAT: CAT can identify the health impairment of COPD patients and is better correlated with disease progression.  CAT has a scoring range of zero to 40. The CAT score is classified into four groups of low (less than 10), medium (10 - 20), high (21-30) and very high (31-40) based on the impact level of disease on health status. A CAT score over 10 suggests significant symptoms.  A worsening CAT score could  be explained by an exacerbation, poor medication adherence, poor inhaler technique, or progression of COPD or comorbid conditions.  CAT MCID is 2 points  mMRC: mMRC (Modified Medical Research Council) Dyspnea Scale is used to assess the degree of baseline functional disability in patients of respiratory disease due to dyspnea. No minimal important difference is established. A decrease in score of 1 point or greater is considered a positive change.   Pulmonary Function Assessment:  Pulmonary Function Assessment - 05/28/19 1151      Pulmonary Function Tests   FVC% 92 %    FEV1% 91 %    FEV1/FVC Ratio 99    DLCO% 48 %      Initial Spirometry Results   FVC% 92 %    FEV1% 91 %    FEV1/FVC Ratio 99      Post Bronchodilator Spirometry Results   FVC% 91 %    FEV1% 88 %    FEV1/FVC Ratio 97       Breath   Bilateral Breath Sounds Clear    Shortness of Breath Yes           Exercise Target Goals: Exercise Program Goal: Individual exercise prescription set using results from initial 6 min walk test and THRR while considering  patient's activity barriers and safety.   Exercise Prescription Goal: Initial exercise prescription builds to 30-45 minutes a day of aerobic activity, 2-3 days per week.  Home exercise guidelines will be given to patient during program as part of exercise prescription that the participant will acknowledge.  Activity Barriers & Risk Stratification:  Activity Barriers & Cardiac Risk Stratification - 05/28/19 1124      Activity Barriers & Cardiac Risk Stratification   Activity Barriers Shortness of Breath;Other (comment)   knee surgeryx4, plantar fascitis (L) foot   Cardiac Risk Stratification Low           6 Minute Walk:  6 Minute Walk    Row Name 05/28/19 1121 10/07/19 1156       6 Minute Walk   Phase Initial Discharge    Distance 1400 feet 1350 feet    Distance % Change -- -3.57 %    Distance Feet Change -- -50 ft    Walk Time 6 minutes 6 minutes    # of Rest Breaks 0 0    MPH 2.65 2.55    METS 3.03 3.66    RPE 14 13    Perceived Dyspnea  16 15    VO2 Peak 13.49 12.83    Symptoms Yes (comment) No    Comments SOB --    Resting HR 66 bpm 65 bpm    Resting BP 120/80 112/82    Resting Oxygen Saturation  97 % 97 %    Exercise Oxygen Saturation  during 6 min walk 94 % 96 %    Max Ex. HR 99 bpm 89 bpm    Max Ex. BP 124/84 130/68    2 Minute Post BP 120/80 100/70      Interval HR   1 Minute HR -- 85    2 Minute HR -- 89    3 Minute HR -- 85    4 Minute HR -- 86    5 Minute HR -- 84    6 Minute HR -- 85    2 Minute Post HR -- 66    Interval Heart Rate? -- Yes      Interval Oxygen   Interval Oxygen? -- Yes  Baseline Oxygen Saturation % -- 97 %    1 Minute Oxygen Saturation % -- 97 %    1 Minute Liters of Oxygen -- 0 L    2 Minute  Oxygen Saturation % -- 96 %    2 Minute Liters of Oxygen -- 0 L    3 Minute Oxygen Saturation % -- 97 %    3 Minute Liters of Oxygen -- 0 L    4 Minute Oxygen Saturation % -- 96 %    4 Minute Liters of Oxygen -- 0 L    5 Minute Oxygen Saturation % -- 97 %    5 Minute Liters of Oxygen -- 0 L    6 Minute Oxygen Saturation % -- 97 %    6 Minute Liters of Oxygen -- 0 L    2 Minute Post Oxygen Saturation % -- 98 %    2 Minute Post Liters of Oxygen -- 0 L           Oxygen Initial Assessment:  Oxygen Initial Assessment - 05/28/19 1149      Home Oxygen   Home Oxygen Device None    Sleep Oxygen Prescription None    Home Exercise Oxygen Prescription None    Home at Rest Exercise Oxygen Prescription None      Initial 6 min Walk   Oxygen Used None      Program Oxygen Prescription   Program Oxygen Prescription None      Intervention   Short Term Goals To learn and understand importance of monitoring SPO2 with pulse oximeter and demonstrate accurate use of the pulse oximeter.;To learn and understand importance of maintaining oxygen saturations>88%;To learn and demonstrate proper pursed lip breathing techniques or other breathing techniques.    Long  Term Goals Verbalizes importance of monitoring SPO2 with pulse oximeter and return demonstration;Maintenance of O2 saturations>88%;Exhibits proper breathing techniques, such as pursed lip breathing or other method taught during program session           Oxygen Re-Evaluation:  Oxygen Re-Evaluation    Row Name 06/24/19 1256 07/16/19 0757 08/11/19 1612 09/02/19 0735 09/29/19 1331     Program Oxygen Prescription   Program Oxygen Prescription _0      Home Oxygen   Home Oxygen Device _1    Sleep Oxygen Prescription _2    Home Exercise Oxygen Prescription _3    Home at Rest Exercise Oxygen Prescription _4    Compliance with Home Oxygen  Use -- Yes --  NA Yes Yes     Goals/Expected Outcomes   Short Term Goals To learn and understand importance of monitoring SPO2 with pulse oximeter and demonstrate accurate use of the pulse oximeter.;To learn and understand importance of maintaining oxygen saturations>88%;To learn and demonstrate proper pursed lip breathing techniques or other breathing techniques. To learn and understand importance of monitoring SPO2 with pulse oximeter and demonstrate accurate use of the pulse oximeter.;To learn and understand importance of maintaining oxygen saturations>88%;To learn and demonstrate proper pursed lip breathing techniques or other breathing techniques. To learn and understand importance of monitoring SPO2 with pulse oximeter and demonstrate accurate use of the pulse oximeter.;To learn and understand importance of maintaining oxygen saturations>88%;To learn and demonstrate proper pursed lip breathing techniques or other breathing techniques.;To learn and exhibit compliance with exercise, home and travel O2 prescription To learn and understand importance of monitoring SPO2 with pulse oximeter and demonstrate  accurate use of the pulse oximeter.;To learn and demonstrate proper pursed lip breathing techniques or other breathing techniques.;To learn and exhibit compliance with exercise, home and travel O2 prescription;To learn and understand importance of maintaining oxygen saturations>88% To learn and understand importance of monitoring SPO2 with pulse oximeter and demonstrate accurate use of the pulse oximeter.;To learn and demonstrate proper pursed lip breathing techniques or other breathing techniques.;To learn and exhibit compliance with exercise, home and travel O2 prescription;To learn and understand importance of maintaining oxygen saturations>88%   Long  Term Goals Verbalizes importance of monitoring SPO2 with pulse oximeter and return demonstration;Maintenance of O2 saturations>88%;Exhibits proper  breathing techniques, such as pursed lip breathing or other method taught during program session Verbalizes importance of monitoring SPO2 with pulse oximeter and return demonstration;Maintenance of O2 saturations>88%;Exhibits proper breathing techniques, such as pursed lip breathing or other method taught during program session Verbalizes importance of monitoring SPO2 with pulse oximeter and return demonstration;Maintenance of O2 saturations>88%;Exhibits proper breathing techniques, such as pursed lip breathing or other method taught during program session;Exhibits compliance with exercise, home and travel O2 prescription;Compliance with respiratory medication Verbalizes importance of monitoring SPO2 with pulse oximeter and return demonstration;Maintenance of O2 saturations>88%;Exhibits proper breathing techniques, such as pursed lip breathing or other method taught during program session;Exhibits compliance with exercise, home and travel O2 prescription;Compliance with respiratory medication Verbalizes importance of monitoring SPO2 with pulse oximeter and return demonstration;Maintenance of O2 saturations>88%;Exhibits proper breathing techniques, such as pursed lip breathing or other method taught during program session;Exhibits compliance with exercise, home and travel O2 prescription;Compliance with respiratory medication   Comments Patiient is ablet to verbalize the importance of maintaining his O2 saturation >88% and the importance of monitoring his O2 saturation. He exhibits proper pursed lip breathing techniques in class. Patiient is ablet to verbalize the importance of maintaining his O2 saturation >88% and the importance of monitoring his O2 saturation and is albe to properly demonstrate usage of pulse oximeter. He exhibits proper pursed lip breathing techniques in class. Patiient is able to verbalize the importance of maintaining his O2 saturation >88% and the importance of monitoring his O2 saturation  and is albe to properly demonstrate usage of pulse oximeter. He exhibits proper pursed lip breathing techniques in class.He is also complaint with his home exercise prescription and all medications. Will continue to monitor for progress. Patiient is able to verbalize the importance of maintaining his O2 saturation >88% and the importance of monitoring his O2 saturation and is albe to properly demonstrate usage of pulse oximeter. He exhibits proper pursed lip breathing techniques in class.He is also complaint with his home exercise prescription and all medications. Will continue to monitor for progress. Patiient is able to verbalize the importance of maintaining his O2 saturation >88% and the importance of monitoring his O2 saturation and is albe to properly demonstrate usage of pulse oximeter. He exhibits proper pursed lip breathing techniques in class.He is also complaint with his home exercise prescription and all medications. Will continue to monitor for progress.   Goals/Expected Outcomes Patient will continue to meet both his short and long term goals. Patient will continue to meet both his short and long term goals. Patient will continue to meet both his short and long term goals. Patient will continue to meet both his short and long term goals. Patient will continue to meet both his short and long term goals.          Oxygen Discharge (Final Oxygen Re-Evaluation):  Oxygen Re-Evaluation - 09/29/19  [place]   Program Oxygen Prescription None      Home Oxygen   Home Oxygen Device None    Sleep Oxygen Prescription None    Home Exercise Oxygen Prescription None    Home at Rest Exercise Oxygen Prescription None    Compliance with Home Oxygen Use Yes      Goals/Expected Outcomes   Short Term Goals To learn and understand importance of monitoring SPO2 with pulse oximeter and demonstrate accurate use of the pulse oximeter.;To learn and demonstrate proper pursed lip  breathing techniques or other breathing techniques.;To learn and exhibit compliance with exercise, home and travel O2 prescription;To learn and understand importance of maintaining oxygen saturations>88%    Long  Term Goals Verbalizes importance of monitoring SPO2 with pulse oximeter and return demonstration;Maintenance of O2 saturations>88%;Exhibits proper breathing techniques, such as pursed lip breathing or other method taught during program session;Exhibits compliance with exercise, home and travel O2 prescription;Compliance with respiratory medication    Comments Patiient is able to verbalize the importance of maintaining his O2 saturation >88% and the importance of monitoring his O2 saturation and is albe to properly demonstrate usage of pulse oximeter. He exhibits proper pursed lip breathing techniques in class.He is also complaint with his home exercise prescription and all medications. Will continue to monitor for progress.    Goals/Expected Outcomes Patient will continue to meet both his short and long term goals.           Initial Exercise Prescription:  Initial Exercise Prescription - 05/28/19 1100      Date of Initial Exercise RX and Referring Provider   Date 05/28/19    Referring Provider Dr. Lamonte Sakai    Expected Discharge Date 08/25/19      Treadmill   MPH 1.3    Grade 0    Minutes 17    METs 1.99      Recumbant Elliptical   Level 1    RPM 45    Watts 64    Minutes 20    METs 3.5      Prescription Details   Frequency (times per week) 2    Duration Progress to 30 minutes of continuous aerobic without signs/symptoms of physical distress      Intensity   THRR 40-80% of Max Heartrate [phone]    Ratings of Perceived Exertion 11-13    Perceived Dyspnea 0-4      Progression   Progression Continue to progress workloads to maintain intensity without signs/symptoms of physical distress.      Resistance Training   Training Prescription Yes    Weight 1    Reps 10-15            Perform Capillary Blood Glucose checks as needed.  Exercise Prescription Changes:   Exercise Prescription Changes    Row Name 06/14/19 1500 06/23/19 1400 07/15/19 1500 08/12/19 0800 09/06/19 1300     Response to Exercise   Blood Pressure (Admit) 142/88 148/82 110/60 122/70 104/62   Blood Pressure (Exercise) 148/82 138/80 118/70 132/78 110/50   Blood Pressure (Exit) 118/72 104/70 100/60 132/78 90/60   Heart Rate (Admit) 82 bpm 86 bpm 55 bpm 77 bpm 68 bpm   Heart Rate (Exercise) 105 bpm 118 bpm 83 bpm 89 bpm 82 bpm   Heart Rate (Exit) 86 bpm 81 bpm 63 bpm 76 bpm 68 bpm   Oxygen Saturation (Admit) 98 % 97 % 96 % 95 % 95 %   Oxygen  Saturation (Exercise) 95 % 95 % 95 % 96 % 98 %   Oxygen Saturation (Exit) 97 % 97 % 95 % 95 % 95 %   Rating of Perceived Exertion (Exercise) _0 Perceived Dyspnea (Exercise) _1 Symptoms _2    Comments has not been 2wks -- -- -- --   Duration Continue with 30 min of aerobic exercise without signs/symptoms of physical distress. Continue with 30 min of aerobic exercise without signs/symptoms of physical distress. Continue with 30 min of aerobic exercise without signs/symptoms of physical distress. Continue with 30 min of aerobic exercise without signs/symptoms of physical distress. Progress to 30 minutes of  aerobic without signs/symptoms of physical distress   Intensity THRR unchanged THRR unchanged THRR New THRR unchanged THRR unchanged     Progression   Progression Continue to progress workloads to maintain intensity without signs/symptoms of physical distress. Continue to progress workloads to maintain intensity without signs/symptoms of physical distress. Continue to progress workloads to maintain intensity without signs/symptoms of physical distress. Continue to progress workloads to maintain intensity without signs/symptoms of physical distress. Continue to progress workloads to maintain intensity without  signs/symptoms of physical distress.   Average METs 2.64 2.54 2.73 2.87 2.96     Resistance Training   Training Prescription _3    Weight _4 Reps 10-15 10-15 10-15 10-15 10-15   Time 10 Minutes 10 Minutes 10 Minutes 10 Minutes 10 Minutes     Oxygen   Liters 0 0 0 -- 0     Treadmill   MPH 1.3 1.4 1.5 1.6 1.7   Grade 0 0 0 0 0   Minutes _5 METs 1.99 2.07 2.07 2.22 2.3     Recumbant Elliptical   Level _6 RPM 50 71 16 57 90   XYBFX 83 29 19 16 60   Minutes _7 METs 3.3 3.1 3.4 3.6 3.7     Home Exercise Plan   Plans to continue exercise at Home (comment) Home (comment) Home (comment) Home (comment) Home (comment)   Frequency Add 3 additional days to program exercise sessions. Add 3 additional days to program exercise sessions. Add 3 additional days to program exercise sessions. Add 3 additional days to program exercise sessions. Add 3 additional days to program exercise sessions.   Initial Home Exercises Provided 05/28/19 05/28/19 05/28/19 05/28/19 05/28/19   Row Name 09/17/19 1600 10/04/19 1500           Response to Exercise   Blood Pressure (Admit) 108/68 100/60      Blood Pressure (Exercise) 104/58 110/60      Blood Pressure (Exit) 92/60 102/50      Heart Rate (Admit) 65 bpm 55 bpm      Heart Rate (Exercise) 79 bpm 104 bpm      Heart Rate (Exit) 72 bpm 68 bpm      Oxygen Saturation (Admit) 95 % 96 %      Oxygen Saturation (Exercise) 95 % 94 %      Oxygen Saturation (Exit) 95 % 94 %      Rating of Perceived Exertion (Exercise) 12 12      Perceived Dyspnea (Exercise) 14 14      Symptoms SOB --      Duration Continue with 30 min  of aerobic exercise without signs/symptoms of physical distress. Continue with 30 min of aerobic exercise without signs/symptoms of physical distress.      Intensity THRR unchanged THRR unchanged        Progression   Progression Continue to progress workloads to maintain intensity  without signs/symptoms of physical distress. Continue to progress workloads to maintain intensity without signs/symptoms of physical distress.      Average METs 3 --        Resistance Training   Training Prescription Yes Yes      Weight 4 3      Reps 10-15 10-15      Time 10 Minutes --        Oxygen   Liters 0 --        Treadmill   MPH 1.7 1.7      Grade 0 0      Minutes 17 17      METs 2.3 2.3        Recumbant Elliptical   Level 3 3      RPM 50 53      Watts 66 70      Minutes 22 22      METs 3.7 3.9        Home Exercise Plan   Plans to continue exercise at Home (comment) --      Frequency Add 3 additional days to program exercise sessions. --      Initial Home Exercises Provided 05/28/19 --             Exercise Comments:   Exercise Comments    Row Name 06/14/19 1520 06/23/19 1528 07/15/19 1552 08/12/19 0832 09/06/19 1406   Exercise Comments Patient is doing well for such a short period of time. Will continue to progress his workloads. Patient is doing well with his first 2 week. Will continue to progress as tolerated. We will continue to progress as tolerated. Patient has been consistent with his attendance. He is doing well. Even though he is still SOB it is making him stronger. Patient does enjoy coming to class. Patient is coming regularly and has a great attitude. Patient tries very hard in order to improve. Will continue to progress as tolerated.   [place] Name 09/17/19 1609           Exercise Comments Patient is very motivated despite his difficulty to breath. Patient shared that coming is making him stronger and able to do more of his ADLs. We will continue to progress as tolerated to continue his trend of getting stronger.              Exercise Goals and Review:   Exercise Goals    Row Name 05/28/19 1141             Exercise Goals   Increase Physical Activity Yes       Intervention Provide advice, education, support and counseling about physical  activity/exercise needs.;Develop an individualized exercise prescription for aerobic and resistive training based on initial evaluation findings, risk stratification, comorbidities and participant's personal goals.       Expected Outcomes Short Term: Attend rehab on a regular basis to increase amount of physical activity.;Long Term: Add in home exercise to make exercise part of routine and to increase amount of physical activity.       Increase Strength and Stamina Yes       Intervention Provide advice, education, support and counseling about physical activity/exercise needs.;Develop an individualized exercise prescription  for aerobic and resistive training based on initial evaluation findings, risk stratification, comorbidities and participant's personal goals.       Expected Outcomes Short Term: Perform resistance training exercises routinely during rehab and add in resistance training at home;Long Term: Improve cardiorespiratory fitness, muscular endurance and strength as measured by increased METs and functional capacity (6MWT)       Able to understand and use rate of perceived exertion (RPE) scale Yes       Intervention Provide education and explanation on how to use RPE scale       Expected Outcomes Short Term: Able to use RPE daily in rehab to express subjective intensity level;Long Term:  Able to use RPE to guide intensity level when exercising independently       Able to understand and use Dyspnea scale Yes       Intervention Provide education and explanation on how to use Dyspnea scale       Expected Outcomes Short Term: Able to use Dyspnea scale daily in rehab to express subjective sense of shortness of breath during exertion;Long Term: Able to use Dyspnea scale to guide intensity level when exercising independently       Knowledge and understanding of Target Heart Rate Range (THRR) Yes       Intervention Provide education and explanation of THRR including how the numbers were predicted and  where they are located for reference       Expected Outcomes Short Term: Able to use daily as guideline for intensity in rehab;Long Term: Able to use THRR to govern intensity when exercising independently       Able to check pulse independently Yes       Intervention Provide education and demonstration on how to check pulse in carotid and radial arteries.;Review the importance of being able to check your own pulse for safety during independent exercise       Expected Outcomes Short Term: Able to explain why pulse checking is important during independent exercise;Long Term: Able to check pulse independently and accurately       Understanding of Exercise Prescription Yes       Intervention Provide education, explanation, and written materials on patient's individual exercise prescription       Expected Outcomes Short Term: Able to explain program exercise prescription;Long Term: Able to explain home exercise prescription to exercise independently              Exercise Goals Re-Evaluation :  Exercise Goals Re-Evaluation    Row Name 06/14/19 1516 06/23/19 1525 07/15/19 1549 08/12/19 0831 09/06/19 1403     Exercise Goal Re-Evaluation   Exercise Goals Review Increase Physical Activity;Increase Strength and Stamina Increase Physical Activity;Increase Strength and Stamina Increase Physical Activity;Increase Strength and [place]amina Increase Physical Activity;Increase Strength and Stamina Increase Physical Activity;Increase Strength and Stamina   Comments Patient has just started program. This is his 4th session. He is already making progress with walking on the TM and using the seated ellipitcal. We will progress his workload in the next 2 weeks. Patient has done well. This is his first progression. Will monitor how he tolerates it and his level of SOB. He has improved walking on the TM. He can now do the entire 17 minutes without stopping. He tolerates the seated elliptical well. Patient is progressing slow  but steady. He is still SOB and has a persistent cough. He has a doctors appointment schduled with his pulmonologist for next week. His goals are to decrease his SOB,  and become active. Main goals are to help with his SOB and become more active. Patient wants to improve shortness of breathe and be more active. Patient is becoming more active even though he is still short or breath.   Expected Outcomes His main goals are to reduce his SOB and to become more active. We will monitor his this and ask patient if he feels he is reaching his expected goals. Reach in expected goals of decreasing his SOB, being able to become more active. To reach his personal goals Reach his personal goals. To reach expected goals   Row Name 09/20/19 1315             Exercise Goal Re-Evaluation   Exercise Goals Review Increase Physical Activity;Increase Strength and Stamina       Comments Patients goals are to have less SOB and to be more active. Patient is doing well in the program. He is still SOB but he does not let that stop him from doing the equipment. He does his workouts well inspite of his SOB. His workloads are increasing with each session. We will continue to monitor his progress in an effort to help him reach his goals.       Expected Outcomes To reach his expected goals.              Discharge Exercise Prescription (Final Exercise Prescription Changes):  Exercise Prescription Changes - 10/04/19 1500      Response to Exercise   Blood Pressure (Admit) 100/60    Blood Pressure (Exercise) 110/60    Blood Pressure (Exit) 102/50    Heart Rate (Admit) 55 bpm    Heart Rate (Exercise) 104 bpm    Heart Rate (Exit) 68 bpm    Oxygen Saturation (Admit) 96 %    Oxygen Saturation (Exercise) 94 %    Oxygen Saturation (Exit) 94 %    Rating of Perceived Exertion (Exercise) 12    Perceived Dyspnea (Exercise) 14    Duration Continue with 30 min of aerobic exercise without signs/symptoms of physical distress.     Intensity THRR unchanged      Progression   Progression Continue to progress workloads to maintain intensity without signs/symptoms of physical distress.      Resistance Training   Training Prescription Yes    Weight 3    Reps 10-15      Treadmill   MPH 1.7    Grade 0    Minutes 17    METs 2.3      Recumbant Elliptical   Level 3    RPM 53    Watts 70    Minutes 22    METs 3.9           Nutrition:  Target Goals: Understanding of nutrition guidelines, daily intake of sodium <159m, cholesterol <2065m calories 30% from fat and 7% or less from saturated fats, daily to have 5 or more servings of fruits and vegetables.  Biometrics:  Pre Biometrics - 05/28/19 1144      Pre Biometrics   Height _0  (1.778 m)    Weight 85.6 kg    Waist Circumference 38 inches    Hip Circumference 36 inches    Waist to Hip Ratio 1.06 %    BMI (Calculated) 27.08    Triceps Skinfold 8 mm    % Body Fat 23.2 %    Grip Strength 31.6 kg    Flexibility 0 in    Single Leg Stand 60 seconds  Post Biometrics - 10/07/19 1207       Post  Biometrics   Height _0  (1.778 m)    Weight 85.2 kg    Waist Circumference 38.5 inches    Hip Circumference 39 inches    Waist to Hip Ratio 0.99 %    BMI (Calculated) 26.95    Triceps Skinfold 6 mm    % Body Fat 22.3 %    Grip Strength 38.1 kg    Single Leg Stand 60 seconds           Nutrition Therapy Plan and Nutrition Goals:  Nutrition Therapy & Goals - 09/29/19 1331      Personal Nutrition Goals   Comments We continue to work on scheduling patients for the RD class. Patient scored 66 on his medificts diet assessment score. We have provided handouts for education. He says he is eating less red meat and fried foods and eating more salads and more chicken. Will continue to monitor for progress.      Intervention Plan   Intervention Nutrition handout(s) given to patient.           Nutrition Assessments:  Nutrition Assessments -  10/11/19 1500      MEDFICTS Scores   Pre Score 66    Post Score 9    Score Difference -57           Nutrition Goals Re-Evaluation:   Nutrition Goals Discharge (Final Nutrition Goals Re-Evaluation):   Psychosocial: Target Goals: Acknowledge presence or absence of significant depression and/or stress, maximize coping skills, provide positive support system. Participant is able to verbalize types and ability to use techniques and skills needed for reducing stress and depression.  Initial Review & Psychosocial Screening:  Initial Psych Review & Screening - 05/28/19 1154      Initial Review   Current issues with None Identified      Family Dynamics   Good Support System? Yes    Concerns No support system      Barriers   Psychosocial barriers to participate in program There are no identifiable barriers or psychosocial needs.      Screening Interventions   Interventions Encouraged to exercise    Expected Outcomes Short Term goal: Identification and review with participant of any Quality of Life or Depression concerns found by scoring the questionnaire.;Long Term goal: The participant improves quality of Life and PHQ9 Scores as seen by post scores and/or verbalization of changes           Quality of Life Scores:  Quality of Life - 10/07/19 1301      Quality of Life   Select Quality of Life      Quality of Life Scores   Health/Function Pre 12.38 %    Health/Function Post 15.75 %    Health/Function % Change 27.22 %    Socioeconomic Pre 30 %    Socioeconomic Post 30 %    Socioeconomic % Change  0 %    Psych/Spiritual Pre 27.43 %    Psych/Spiritual Post 29.14 %    Psych/Spiritual % Change 6.23 %    Family Pre 24 %    Family Post 30 %    Family % Change 25 %    GLOBAL Pre 20.29 %    GLOBAL Post 23.12 %    GLOBAL % Change 13.95 %          Scores of 19 and below usually indicate a poorer quality of life in these areas.  A difference of  2-3 points is a clinically  meaningful difference.  A difference of 2-3 points in the total score of the Quality of Life Index has been associated with significant improvement in overall quality of life, self-image, physical symptoms, and general health in studies assessing change in quality of life.   PHQ-9: Recent Review Flowsheet Data    Depression screen [facility] 2/9 10/11/2019 05/28/2019   Decreased Interest 0 0   Down, Depressed, Hopeless 0 0   PHQ - 2 Score 0 0   Altered sleeping 3 3   Tired, decreased energy 2 2   Change in appetite 0 0   Feeling bad or failure about yourself  0 0   Trouble concentrating 0 0   Moving slowly or fidgety/restless 0 0   Suicidal thoughts 0 0   PHQ-9 Score 5 5   Difficult doing work/chores Very difficult Very difficult     Interpretation of Total Score  Total Score Depression Severity:  1-4 = Minimal depression, 5-9 = Mild depression, 10-14 = Moderate depression, 15-19 = Moderately severe depression, 20-27 = Severe depression   Psychosocial Evaluation and Intervention:  Psychosocial Evaluation - 10/11/19 1513      Discharge Psychosocial Assessment & Intervention   Comments Patient's exit QOL score improved by 3.91% at 23.12% an his PHQ-9 score remained the same at 5. He has no psychosocial issues identified at discharge.           Psychosocial Re-Evaluation:  Psychosocial Re-Evaluation    Row Name 06/24/19 1300 07/16/19 0803 08/11/19 1618 09/02/19 0735 09/29/19 1331     Psychosocial Re-Evaluation   Current issues with _0    Comments Patient's initial QOL was 20.29 and PHQ-9 was 5. He continues to take Valium 10 mg daily. He has no psychosocial issues identified. Patient's initial QOL was 20.29 and PHQ-9 was 5. He continues to take Valium 10 mg daily. He has no psychosocial issues identified. Patient's initial QOL was 20.29 and PHQ-9 was 5. He continues to take Valium 10 mg daily. He has no  psychosocial issues identified. Patient's initial QOL was 20.29 and PHQ-9 was 5. He continues to take Valium 10 mg daily. He has no psychosocial issues identified. Patient's initial QOL was 20.29 and PHQ-9 was 5. He continues to take Valium 10 mg daily. He has no psychosocial issues identified.   Expected Outcomes Patient will have no psychosocial issues identified at discharge. Patient will have no psychosocial issues identified at discharge. Patient will have no psychosocial issues identified at discharge. Patient will have no psychosocial issues identified at discharge. Patient will have no psychosocial issues identified at discharge.   Interventions Encouraged to attend Pulmonary Rehabilitation for the exercise;Relaxation education;Stress management education Encouraged to attend Pulmonary Rehabilitation for the exercise;Relaxation education;Stress management education Encouraged to attend Pulmonary Rehabilitation for the exercise;Relaxation education;Stress management education Encouraged to attend Pulmonary Rehabilitation for the exercise;Relaxation education;Stress management education Encouraged to attend Pulmonary Rehabilitation for the exercise;Relaxation education;Stress management education   Continue Psychosocial Services  No Follow up required No Follow up required No Follow up required No Follow up required No Follow up required          Psychosocial Discharge (Final Psychosocial Re-Evaluation):  Psychosocial Re-Evaluation - 09/29/19 1331      Psychosocial Re-Evaluation   Current issues with None Identified    Comments Patient's initial QOL was 20.29 and PHQ-9 was 5. He continues to take Valium 10 mg daily.  He has no psychosocial issues identified.    Expected Outcomes Patient will have no psychosocial issues identified at discharge.    Interventions Encouraged to attend Pulmonary Rehabilitation for the exercise;Relaxation education;Stress management education    Continue Psychosocial  Services  No Follow up required            Education: Education Goals: Education classes will be provided on a weekly basis, covering required topics. Participant will state understanding/return demonstration of topics presented.  Learning Barriers/Preferences:  Learning Barriers/Preferences - 05/28/19 1159      Learning Barriers/Preferences   Learning Barriers None    Learning Preferences Group Instruction;Skilled Demonstration           Education Topics: How Lungs Work and Diseases: - Discuss the anatomy of the lungs and diseases that can affect the lungs, such as COPD.   PULMONARY REHAB OTHER RESPIRATORY from 10/07/2019 in [place]  Date 07/29/19  Educator D. Coad  Instruction Review Code 2- Demonstrated Understanding      Exercise: -Discuss the importance of exercise, FITT principles of exercise, normal and abnormal responses to exercise, and how to exercise safely.   Environmental Irritants: -Discuss types of environmental irritants and how to limit exposure to environmental irritants.   PULMONARY REHAB OTHER RESPIRATORY from 10/07/2019 in [place]  Date 08/05/19  Educator [place]  Instruction Review Code 2- Demonstrated Understanding      Meds/Inhalers and oxygen: - Discuss respiratory medications, definition of an inhaler and oxygen, and the proper way to use an inhaler and oxygen.   PULMONARY REHAB OTHER RESPIRATORY from 10/07/2019 in [place]  Date 08/12/19  Educator D. Wynetta Emery      Energy Saving Techniques: - Discuss methods to conserve energy and decrease shortness of breath when performing activities of daily living.    PULMONARY REHAB OTHER RESPIRATORY from 10/07/2019 in [place]  Date 07/15/19  Educator [place]  Instruction Review Code 2- Demonstrated Understanding      Bronchial Hygiene / Breathing Techniques: - Discuss breathing mechanics,  pursed-lip breathing technique,  proper posture, effective ways to clear airways, and other functional breathing techniques   Cleaning Equipment: - Provides group verbal and written instruction about the health risks of elevated stress, cause of high stress, and healthy ways to reduce stress.   PULMONARY REHAB OTHER RESPIRATORY from 10/07/2019 in [place]  Date 08/26/19  Educator [place]  Instruction Review Code 2- Demonstrated Understanding      Nutrition I: Fats: - Discuss the types of cholesterol, what cholesterol does to the body, and how cholesterol levels can be controlled.   Nutrition II: Labels: -Discuss the different components of food labels and how to read food labels.   PULMONARY REHAB OTHER RESPIRATORY from 10/07/2019 in [place]  Date 06/17/19  Educator DWynetta Emery  Instruction Review Code 2- Demonstrated Understanding      Respiratory Infections: - Discuss the signs and symptoms of respiratory infections, ways to prevent respiratory infections, and the importance of seeking medical treatment when having a respiratory infection.   Stress I: Signs and Symptoms: - Discuss the causes of stress, how stress may lead to anxiety and depression, and ways to limit stress.   PULMONARY REHAB OTHER RESPIRATORY from 10/07/2019 in [place]  Date 07/01/19  Educator D.Shivan Hodes  Instruction Review Code 2- Demonstrated Understanding      Stress II: Relaxation: -Discuss relaxation techniques to limit stress.   PULMONARY  REHAB OTHER RESPIRATORY from 10/07/2019 in [place]  Date 10/07/19  Educator DF  Instruction Review Code 2- Demonstrated Understanding      Oxygen for Home/Travel: - Discuss how to prepare for travel when on oxygen and proper ways to transport and store oxygen to ensure safety.   PULMONARY REHAB OTHER RESPIRATORY from 10/07/2019 in [place]   Date 07/15/19  Educator Etheleen Mayhew  Instruction Review Code 2- Demonstrated Understanding      Knowledge Questionnaire Score:  Knowledge Questionnaire Score - 10/11/19 1502      Knowledge Questionnaire Score   Pre Score 16/18    Post Score 14/18           Core Components/Risk Factors/Patient Goals at Admission:  Personal Goals and Risk Factors at Admission - 05/28/19 1204      Core Components/Risk Factors/Patient Goals on Admission    Weight Management Weight Maintenance    Personal Goal Other Yes    Personal Goal Help with SOB, Be more active    Intervention Attend PR 2 x week and supplement with 3 x week of TM waling and stationary bike riding at home.    Expected Outcomes Reach goals           Core Components/Risk Factors/Patient Goals Review:   Goals and Risk Factor Review    Row Name 06/24/19 1259 07/16/19 0758 08/11/19 1614 09/02/19 0735 09/29/19 1334     Core Components/Risk Factors/Patient Goals Review   Personal Goals Review Weight Management/Obesity;Improve shortness of breath with ADL's  Decrease SOB; be more active. Weight Management/Obesity;Improve shortness of breath with ADL's  Decrease SOB; be more active. Weight Management/Obesity;Improve shortness of breath with ADL's  Decrease SOB; be more active. Weight Management/Obesity;Improve shortness of breath with ADL's  Decrease SOB; be more active. Weight Management/Obesity;Improve shortness of breath with ADL's  Decrease SOB; be more active.   Review Patient has completed 7 sessions gaining 6 lbs since his orientation visit. He is doing well in the program with some progressions. He says he has not noticed an improvement in his SOB yet but is enjoying the program. Will continue to monitor for progress. Patient has completed 14 sessions gaining 1 lb since last 30 day review. He continues to do well in the program with progression. He has recently been having more congestion and coughing up more phlegm. His  pulmonologist retired and he has finally found a MD to go to. He has an appointment in April. He does feel like he is making progress with getting stronger but his SOB has not changed. He still howerer, feels the program is a benefit and hopes his new MD will have some new treatment options. Will continue to  monitor for progress. Patient has completed 21 sessions losing 3 lbs since his last 30 day review. He continues to do well in the program with progression. He states he has built strength in his muscles but his SOB has not changed. He does feel stronger. He says he is going to keep pushing himself to get through the program and also continue to exercise at home. Will continue to monitor for progress. Patient has completed 26 sessions maintaining his weight since last 30 day review. He continues to do well in the program with progression. He says he does feel stronger and continues to feel like he is building muscle strength but his SOB has not improved. He says he has more energy and feels like he is able to be  more active. He continues to exercise at home. He is pleased with his progress in the program. Will continue to monitor for progress. Patient has completed 33 sessions maintaining his weight since last 30 day review. He continues to do well in the program with progression. He says he continues to feel stronger and feels like his muscle strength is improving. He is still exercising at home and is pleased with the progress he has made in the progam. Will continue to monitor for progress.   Expected Outcomes Patient will continue to attend sessions and complete the program meeting his personal goals. Patient will continue to attend sessions and complete the program meeting his personal goals. Patient will continue to attend sessions and complete the program meeting both his personal and program goals. Patient will continue to attend sessions and complete the program meeting both his personal and program  goals. Patient will continue to attend sessions and complete the program meeting both his personal and program goals.   [place] Name 10/11/19 1504             Core Components/Risk Factors/Patient Goals Review   Personal Goals Review Weight Management/Obesity;Improve shortness of breath with ADL's  Help with SOB; Be more active.       Review Patient graduated with 36 sessions maintaining his weight throughout the program. He did very well in the program. His exit measurements improved in grip strength. His exit walk test did not improve at -3.57%. He says the day was humid and his breathing was not good. His exit pulmonary assessment scores did improve and his CAT score remained the same. He says he feels he did get stronger in the program but his breathing and SOB did not improve. He still feels like the program was beneficial. He says the educational handouts was helpful. His medficts assessment score improved by 86% so he feels he learned how to eat healthier while in the program. He plans to continue exercising at home on his treadmill and stationary bike. PR will f/u for one year.       Expected Outcomes Patient will continue to exercise at home on his treadmill and stationary bike and continue to work toward meeting his personal goals.              Core Components/Risk Factors/Patient Goals at Discharge (Final Review):   Goals and Risk Factor Review - 10/11/19 1504      Core Components/Risk Factors/Patient Goals Review   Personal Goals Review Weight Management/Obesity;Improve shortness of breath with ADL's   Help with SOB; Be more active.   Review Patient graduated with 36 sessions maintaining his weight throughout the program. He did very well in the program. His exit measurements improved in grip strength. His exit walk test did not improve at -3.57%. He says the day was humid and his breathing was not good. His exit pulmonary assessment scores did improve and his CAT score remained the same.  He says he feels he did get stronger in the program but his breathing and SOB did not improve. He still feels like the program was beneficial. He says the educational handouts was helpful. His medficts assessment score improved by 86% so he feels he learned how to eat healthier while in the program. He plans to continue exercising at home on his treadmill and stationary bike. PR will f/u for one year.    Expected Outcomes Patient will continue to exercise at home on his treadmill and stationary bike and continue to  work toward meeting his personal goals.           ITP Comments:  ITP Comments    Row Name 05/28/19 1117           ITP Comments Patient is eager to get started.              Comments: Patient graduated from Pulmonary Rehabilitation today on 10/07/19 after completing 36 sessions. He achieved LTG of 30 minutes of aerobic exercise at Max Met level of 3.9. All patients vitals are WNL.  Discharge instruction has been reviewed in detail and patient stated an understanding of material given. Patient plans to exercise at home on his Treadmill and stationary bike. Pulmonary Rehab staff will make f/u calls at 1 month, 6 months, and 1 year. Patient had no complaints of any abnormal S/S or pain on their exit visit.

## 2019-11-05 ENCOUNTER — Ambulatory Visit (HOSPITAL_COMMUNITY): Payer: BC Managed Care – PPO

## 2020-01-06 DIAGNOSIS — E663 Overweight: Secondary | ICD-10-CM | POA: Diagnosis not present

## 2020-01-06 DIAGNOSIS — J449 Chronic obstructive pulmonary disease, unspecified: Secondary | ICD-10-CM | POA: Diagnosis not present

## 2020-01-06 DIAGNOSIS — Z23 Encounter for immunization: Secondary | ICD-10-CM | POA: Diagnosis not present

## 2020-01-06 DIAGNOSIS — Z6825 Body mass index (BMI) 25.0-25.9, adult: Secondary | ICD-10-CM | POA: Diagnosis not present

## 2020-01-06 DIAGNOSIS — G4700 Insomnia, unspecified: Secondary | ICD-10-CM | POA: Diagnosis not present

## 2020-03-01 ENCOUNTER — Ambulatory Visit (INDEPENDENT_AMBULATORY_CARE_PROVIDER_SITE_OTHER): Payer: BC Managed Care – PPO | Admitting: Cardiology

## 2020-03-01 ENCOUNTER — Other Ambulatory Visit: Payer: Self-pay

## 2020-03-01 ENCOUNTER — Encounter: Payer: Self-pay | Admitting: Cardiology

## 2020-03-01 VITALS — BP 124/78 | HR 74 | Ht 71.0 in | Wt 192.0 lb

## 2020-03-01 DIAGNOSIS — J431 Panlobular emphysema: Secondary | ICD-10-CM

## 2020-03-01 DIAGNOSIS — R001 Bradycardia, unspecified: Secondary | ICD-10-CM

## 2020-03-01 NOTE — Progress Notes (Signed)
Cardiology Office Note:    Date:  03/01/2020   ID:  Peter Mahoney, DOB 1960/06/10, MRN [id number]  PCP:  [name], MD  Cardiologist:  No primary care provider on file.  Electrophysiologist:  None   Referring MD: [name], MD   No chief complaint on file.   History of Present Illness:    Peter Mahoney is a 59 y.o. male with a hx of emphysema who was seen in the office today for routine follow-up.  The patient has been evaluated in the past, coronary CT scan in September 2020 showed nonobstructive coronary disease with a calcium score of 10.  The patient has noticed intermittent bradycardia, he has a watch that monitors his heart rate.  He has had heart rates as low as 48.  He did have a monitor in July 2020 that showed sinus rhythm with no significant bradycardia.  He denies any symptoms of unusual exertional fatigue, near-syncope, or syncope.  His main limitation to activity is his emphysema.  Past Medical History:  Diagnosis Date  . Dental crown present   . GERD (gastroesophageal reflux disease)   . Post-operative infection 09/2011   left leg  . Seasonal allergies   . Wound infection after surgery, left lateral leg 10/08/2011    Past Surgical History:  Procedure Laterality Date  . BACK SURGERY  04/1995   L5-S1  . COLONOSCOPY N/A 05/12/2015   Procedure: COLONOSCOPY;  Surgeon: Aviva Signs, MD;  Location: [facility];  Service: Gastroenterology;  Laterality: N/A;  . INCISION AND DRAINAGE OF WOUND  10/08/2011   Procedure: IRRIGATION AND DEBRIDEMENT WOUND;  Surgeon: Johnny Bridge, MD;  Location: [facility];  Service: Orthopedics;  Laterality: Left;  left left post op wound  . KNEE ARTHROSCOPY  11/2010   left knee - meniscus tear  . KNEE SURGERY  08/26/2011   hamstring tendon tear left  . NM MYOVIEW LTD  11/17/2012   Exercise 7 minutes. EF 60%. No ischemia or infarction.; Low risk  . TONSILLECTOMY     as a child  . TRANSTHORACIC ECHOCARDIOGRAM  01/2016    Normal LV function EF 5560%. No regional wall motion abnormality. 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 Mild LA dilation. Otherwise normal    Current Medications: Current Meds  Medication Sig  . albuterol (VENTOLIN HFA) 108 (90 Base) MCG/ACT inhaler Inhale 2 puffs into the lungs every 4 (four) hours as needed for wheezing or shortness of breath.  . diazepam (VALIUM) 10 MG tablet 10 mg at bedtime.   . RABEprazole (ACIPHEX) 20 MG tablet Take 20 mg by mouth in the morning and at bedtime.   . sildenafil (VIAGRA) 100 MG tablet Take 1 tablet (100 mg total) by mouth daily as needed for erectile dysfunction.  . tamsulosin (FLOMAX) 0.4 MG CAPS capsule Take 0.4 mg by mouth at bedtime.      Allergies:   Dilaudid [hydromorphone hcl], Doxycycline, and Oxycodone   Social History   Socioeconomic History  . Marital status: Married    Spouse name: Peter Mahoney  . Number of children: 2  . Years of education: 15  . Highest education level: Not on file  Occupational History    Employer: [employer]    Comment: [employer]  Tobacco Use  . Smoking status: Former Smoker    Packs/day: 1.00    Years: 35.00    Pack years: 35.00    Types: Cigarettes  Start date: 74    Quit date: 11/01/2015    Years since quitting: 4.3  . Smokeless tobacco: Never Used  Vaping Use  . Vaping Use: Never used  Substance and Sexual Activity  . Alcohol use: Yes    Alcohol/week: 2.0 standard drinks    Types: 2 Standard drinks or equivalent per week    Comment: occasionally  . Drug use: No  . Sexual activity: Not on file  Other Topics Concern  . Not on file  Social History Narrative   Married father of 2, Grandfather of 3. Wife's name is Peter Mahoney.   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   Currently smokes a pack a day, and drinks maybe 2-3 alcohol beverages a week.         Epworth Sleepiness Scale       Score: 9      --I seem to be losing my sex drive   --I feel stressed and lack motivation   --I have COPD   --I have been told that I snore   --I am overweight or am gaining weight   --I awake feeling not rested   Social Determinants of Health   Financial Resource Strain:   . Difficulty of Paying Living Expenses: Not on file  Food Insecurity:   . Worried About Charity fundraiser in the Last Year: Not on file  . Ran Out of Food in the Last Year: Not on file  Transportation Needs:   . Lack of Transportation (Medical): Not on file  . Lack of Transportation (Non-Medical): Not on file  Physical Activity:   . Days of Exercise per Week: Not on file  . Minutes of Exercise per Session: Not on file  Stress:   . Feeling of Stress : Not on file  Social Connections:   . Frequency of Communication with Friends and Family: Not on file  . Frequency of Social Gatherings with Friends and Family: Not on file  . Attends Religious Services: Not on file  . Active Member of Clubs or Organizations: Not on file  . Attends Archivist Meetings: Not on file  . Marital Status: Not on file     Family History: The patient's family history includes Cancer in his paternal grandfather; Cancer - Lung in his father; Diabetes in his paternal grandmother; Emphysema in his maternal grandfather; Heart Problems in his mother; Heart failure in his maternal grandmother; Pulmonary fibrosis in his mother; Stroke in his mother.  ROS:   Please see the history of present illness.     All other systems reviewed and are negative.  EKGs/Labs/Other Studies Reviewed:    The following studies were reviewed today: Coronary CTA 12/25/2018- IMPRESSION: 1. Coronary artery calcium score 10.5 units. This places the patient in the 50th percentile for age and gender, suggesting intermediate risk for future cardiac events.  2.  Nonobstructive coronary disease.  EKG:  EKG is ordered today.  The ekg ordered today  demonstrates NSR, HR 72  Recent Labs: No results found for requested labs within last 8760 hours.  Recent Lipid Panel No results found for: CHOL, TRIG, HDL, CHOLHDL, VLDL, LDLCALC, LDLDIRECT  Physical Exam:    VS:  BP 124/78   Pulse 74   Ht 5\' 11"  (1.803 m)   Wt 192 lb (87.1 kg)   SpO2 95%   BMI 26.78 kg/m     Wt Readings from Last 3  Encounters:  03/01/20 192 lb (87.1 kg)  10/07/19 187 lb 13.3 oz (85.2 kg)  09/30/19 190 lb (86.2 kg)     GEN:  Well nourished, well developed in no acute distress HEENT: Normal NECK: No JVD; No carotid bruits CARDIAC: RRR, no murmurs, rubs, gallops RESPIRATORY:  Clear to auscultation without rales, wheezing or rhonchi  ABDOMEN: Soft, non-tender, non-distended MUSCULOSKELETAL:  No edema; No deformity  SKIN: Warm and dry NEUROLOGIC:  Alert and oriented x 3 PSYCHIATRIC:  Normal affect   ASSESSMENT:    Bradycardia Asymptomatic- no change in Rx  Emphysema-  PLAN:    F/U one year- no change in Rx.  F/U lipids (to be done at PCP).   Medication Adjustments/Labs and Tests Ordered: Current medicines are reviewed at length with the patient today.  Concerns regarding medicines are outlined above.  Orders Placed This Encounter  Procedures  . EKG 12-Lead   No orders of the defined types were placed in this encounter.   Patient Instructions  Your physician wants you to follow-up in: 1 YEAR  You will receive a reminder letter in the mail two months in advance. If you don't receive a letter, please call our office to schedule the follow-up appointment.  Your physician recommends that you continue on your current medications as directed. Please refer to the Current Medication list given to you today.  Thank you for choosing [facility]!!      Signed, Kerin Ransom, PA-C  03/01/2020 10:39 AM    [place]

## 2020-03-01 NOTE — Assessment & Plan Note (Signed)
Asymptomatic- no change in Rx

## 2020-03-01 NOTE — Patient Instructions (Addendum)
Your physician wants you to follow-up in: [place] will receive a reminder letter in the mail two months in advance. If you don't receive a letter, please call our office to schedule the follow-up appointment.  Your physician recommends that you continue on your current medications as directed. Please refer to the Current Medication list given to you today.  Thank you for choosing [facility]!!

## 2020-03-08 ENCOUNTER — Other Ambulatory Visit: Payer: Self-pay

## 2020-03-08 ENCOUNTER — Ambulatory Visit (INDEPENDENT_AMBULATORY_CARE_PROVIDER_SITE_OTHER): Payer: BC Managed Care – PPO | Admitting: Emergency Medicine

## 2020-03-08 ENCOUNTER — Encounter: Payer: Self-pay | Admitting: Emergency Medicine

## 2020-03-08 DIAGNOSIS — J431 Panlobular emphysema: Secondary | ICD-10-CM | POA: Diagnosis not present

## 2020-03-08 NOTE — Patient Instructions (Signed)
Keep up the good work with your exercise routine. Keep your albuterol available use 2 puffs if needed for shortness of breath, chest tightness, wheezing.  You could try doing 2 puffs before your exercise as well. Flu shot up-to-date, pneumonia shot up-to-date. COVID-19 vaccine up-to-date, you need the booster shot.  Okay to go ahead and get this. Follow with Dr Lamonte Sakai in 6 months or sooner if you have any problems

## 2020-03-08 NOTE — Addendum Note (Signed)
Addended by: Gavin Potters R on: 03/08/2020 01:40 PM   Modules accepted: Orders

## 2020-03-08 NOTE — Progress Notes (Signed)
Subjective:    Patient ID: Peter Mahoney, male    DOB: March 15, 1961, 59 y.o.   MRN: [id number]  HPI 59 year old former smoker (45 pack years) who has been followed by Dr. Luan Pulling for COPD.  Also with pulmonary nodular disease for which he underwent serial CT scans of the chest 10/30/2015 through 08/05/2017, stable.  Quantitative alpha-1 antitrypsin done 05/30/2017 was 161 (normal), no genotype tested.  Most recent pulmonary function testing reviewed by me from 05/20/2018 shows normal airflows and volumes but curve to his flow volume loop.  He also has a significant defect in diffusion capacity.  He is doing Pulmonary Rehab at [facility]. Has not had any desaturations.   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   He has exertional SOB, also sometimes at rest. He has tried Trelegy, Anoro without effect. He has albuterol to use prn. Uses rarely and has had minimal effect. He has some daily cough, has phlegm, clear, happens every day. Last prednisone was a year ago.   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  He also has a history of pulmonary nodular disease it was deemed benign based on serial scans, now participates in the lung cancer screening program >> should repeat June '21.  At his last visit we decided to retry bronchodilators, started Spiriva Respimat.  He has been participating in the cardiopulmonary rehab, feels that his breathing is about the same. He stopped the Spiriva at 6 weeks. He doesn't notice improvement with SABA either.  He has had covid vaccines Being followed by cardiology for sinus brady  ROV 03/08/20 --59 year old man who has severe emphysematous COPD, mild obstruction by PFT.  Not currently on scheduled bronchodilator therapy because he did not feel that he got good benefit, tried Spiriva.  He has albuterol which he uses.  He  underwent cardiopulmonary rehab - unsure whether it helped his SOB, but his overall strength is better. He is still working out at home. Doing 0.5 mile on treadmill. Uses albuterol rarely. No flares, no abx or pred.  COVID-19 vaccine up-to-date, flu shot up-to-date, had the pneumonia vaccine in 2020.   Review of Systems As above  Past Medical History:  Diagnosis Date  . Dental crown present   . GERD (gastroesophageal reflux disease)   . Post-operative infection 09/2011   left leg  . Seasonal allergies   . Wound infection after surgery, left lateral leg 10/08/2011     Family History  Problem Relation Age of Onset  . Heart Problems Mother   . Stroke Mother   . Pulmonary fibrosis Mother   . Cancer - Lung Father        smoked  . Heart failure Maternal Grandmother   . Emphysema Maternal Grandfather        "his whole family had Emphysema"  . Diabetes Paternal Grandmother   . Cancer Paternal Grandfather      Social History   Socioeconomic History  . Marital status: Married    Spouse name: [name]  . Number of children: 2  . Years of education: 34  . Highest education level: Not on file  Occupational History    Employer: [employer]    Comment: [employer]  Tobacco Use  . Smoking status: Former Smoker    Packs/day: 1.00    Years: 35.00  Pack years: 35.00    Types: Cigarettes    Start date: 49    Quit date: 11/01/2015    Years since quitting: 4.3  . Smokeless tobacco: Never Used  Vaping Use  . Vaping Use: Never used  Substance and Sexual Activity  . Alcohol use: Yes    Alcohol/week: 2.0 standard drinks    Types: 2 Standard drinks or equivalent per week    Comment: occasionally  . Drug use: No  . Sexual activity: Not on file  Other Topics Concern  . Not on file  Social History Narrative   Married father of 2, Grandfather of 3. Wife's name is [name].   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   Currently  smokes a pack a day, and drinks maybe 2-3 alcohol beverages a week.         Epworth Sleepiness Scale      Score: 9      --I seem to be losing my sex drive   --I feel stressed and lack motivation   --I have COPD   --I have been told that I snore   --I am overweight or am gaining weight   --I awake feeling not rested   Social Determinants of Health   Financial Resource Strain:   . Difficulty of Paying Living Expenses: Not on file  Food Insecurity:   . Worried About Charity fundraiser in the Last Year: Not on file  . Ran Out of Food in the Last Year: Not on file  Transportation Needs:   . Lack of Transportation (Medical): Not on file  . Lack of Transportation (Non-Medical): Not on file  Physical Activity:   . Days of Exercise per Week: Not on file  . Minutes of Exercise per Session: Not on file  Stress:   . Feeling of Stress : Not on file  Social Connections:   . Frequency of Communication with Friends and Family: Not on file  . Frequency of Social Gatherings with Friends and Family: Not on file  . Attends Religious Services: Not on file  . Active Member of Clubs or Organizations: Not on file  . Attends Archivist Meetings: Not on file  . Marital Status: Not on file  Intimate Partner Violence:   . Fear of Current or Ex-Partner: Not on file  . Emotionally Abused: Not on file  . Physically Abused: Not on file  . Sexually Abused: Not on file  [employer]  Cigarette company   Allergies  Allergen Reactions  . Dilaudid [Hydromorphone Hcl] Other (See Comments)    Urinary retention- says with most opioids taken  . Doxycycline Nausea And Vomiting  . Oxycodone      Outpatient Medications Prior to Visit  Medication Sig Dispense Refill  . albuterol (VENTOLIN HFA) 108 (90 Base) MCG/ACT inhaler Inhale 2 puffs into the lungs every 4 (four) hours as needed for wheezing or shortness of breath. 18 g 5  . diazepam (VALIUM) 10 MG tablet 10 mg at bedtime.     .  RABEprazole (ACIPHEX) 20 MG tablet Take 20 mg by mouth in the morning and at bedtime.     . sildenafil (VIAGRA) 100 MG tablet Take 1 tablet (100 mg total) by mouth daily as needed for erectile dysfunction. 10 tablet 6  . tamsulosin (FLOMAX) 0.4 MG CAPS capsule Take 0.4 mg by mouth at bedtime.  No facility-administered medications prior to visit.         Objective:   Physical Exam Vitals:   03/08/20 1157  BP: 118/78  Pulse: 84  Temp: 97.7 F (36.5 C)  SpO2: 97%  Weight: 190 lb 9.6 oz (86.5 kg)  Height: 5\' 11"  (1.803 m)   Gen: Pleasant, well-nourished, in no distress,  normal affect  ENT: No lesions,  mouth clear,  oropharynx clear, no postnasal drip  Neck: No JVD, no stridor  Lungs: No use of accessory muscles, distant, no crackles or wheezing on normal respiration, no wheeze on forced expiration  Cardiovascular: RRR, heart sounds normal, no murmur or gallops, no peripheral edem  Musculoskeletal: No deformities, no cyanosis or clubbing  Neuro: alert, awake, non focal  Skin: Warm, no lesions or rash       Assessment & Plan:  COPD with emphysema (HCC) Emphysematous COPD with fairly preserved airflows but a significant diffusion defect.  He does not desaturate with exertion but I suspect he will require oxygen at some point going forward.  He has not gotten much benefit from long-acting bronchodilators, does not really get a lot from albuterol either.  He does keep it available to use if needed.  We will hold off on starting a maintenance medication for now, will likely retry as symptoms evolve.  Keep up the good work with your exercise routine. Keep your albuterol available use 2 puffs if needed for shortness of breath, chest tightness, wheezing.  You could try doing 2 puffs before your exercise as well. Flu shot up-to-date, pneumonia shot up-to-date. COVID-19 vaccine up-to-date, you need the booster shot.  Okay to go ahead and get this. Follow with Dr Lamonte Sakai in 6  months or sooner if you have any problems  Baltazar Apo, MD, PhD 03/08/2020, 12:45 PM [facility] [phone] or if no answer [phone]

## 2020-03-08 NOTE — Assessment & Plan Note (Signed)
Emphysematous COPD with fairly preserved airflows but a significant diffusion defect.  He does not desaturate with exertion but I suspect he will require oxygen at some point going forward.  He has not gotten much benefit from long-acting bronchodilators, does not really get a lot from albuterol either.  He does keep it available to use if needed.  We will hold off on starting a maintenance medication for now, will likely retry as symptoms evolve.  Keep up the good work with your exercise routine. Keep your albuterol available use 2 puffs if needed for shortness of breath, chest tightness, wheezing.  You could try doing 2 puffs before your exercise as well. Flu shot up-to-date, pneumonia shot up-to-date. COVID-19 vaccine up-to-date, you need the booster shot.  Okay to go ahead and get this. Follow with Dr Lamonte Sakai in 6 months or sooner if you have any problems

## 2020-03-13 DIAGNOSIS — Z0279 Encounter for issue of other medical certificate: Secondary | ICD-10-CM

## 2020-03-15 ENCOUNTER — Telehealth: Payer: Self-pay | Admitting: Cardiology

## 2020-03-15 NOTE — Telephone Encounter (Signed)
New message    Forms from [place] received on 03/13/20 via fax .Patient has to come in an complete patient auth and release of information , patient aware that he has to come in and fill out forms, patient aware of $29.00 charge he will stop in Monday 03/20/20 to complete.  NJM 03/15/20

## 2020-03-20 NOTE — Telephone Encounter (Signed)
Patient came in office and paid $29.00 fee/ paperwork faxed to [facility]

## 2020-03-21 NOTE — Telephone Encounter (Signed)
Forms received from [place] office, copy put in [facility] box.

## 2020-03-23 ENCOUNTER — Ambulatory Visit: Payer: BC Managed Care – PPO | Attending: Internal Medicine

## 2020-03-23 DIAGNOSIS — Z23 Encounter for immunization: Secondary | ICD-10-CM

## 2020-03-23 NOTE — Progress Notes (Signed)
   Covid-19 Vaccination Clinic  Name:  PELHAM HENNICK    MRN: [id number] DOB: 1961-03-10  03/23/2020  Mr. Po was observed post Covid-19 immunization for 15 minutes without incident. He was provided with Vaccine Information Sheet and instruction to access the V-Safe system.   Mr. Southwell was instructed to call 911 with any severe reactions post vaccine: Marland Kitchen Difficulty breathing  . Swelling of face and throat  . A fast heartbeat  . A bad rash all over body  . Dizziness and weakness   Immunizations Administered    Name Date Dose VIS Date Route   Pfizer COVID-19 Vaccine 03/23/2020  2:12 PM 0.3 mL 02/09/2020 Intramuscular   Manufacturer: [place]   Lot: [id number]   NDC: [address]-5

## 2020-03-24 ENCOUNTER — Other Ambulatory Visit: Payer: Self-pay

## 2020-03-24 ENCOUNTER — Ambulatory Visit (INDEPENDENT_AMBULATORY_CARE_PROVIDER_SITE_OTHER): Payer: BC Managed Care – PPO | Admitting: Urology

## 2020-03-24 ENCOUNTER — Encounter: Payer: Self-pay | Admitting: Urology

## 2020-03-24 VITALS — BP 103/63 | HR 67 | Temp 98.1°F | Ht 71.0 in | Wt 190.6 lb

## 2020-03-24 DIAGNOSIS — N401 Enlarged prostate with lower urinary tract symptoms: Secondary | ICD-10-CM | POA: Diagnosis not present

## 2020-03-24 DIAGNOSIS — R3912 Poor urinary stream: Secondary | ICD-10-CM | POA: Diagnosis not present

## 2020-03-24 DIAGNOSIS — N5201 Erectile dysfunction due to arterial insufficiency: Secondary | ICD-10-CM

## 2020-03-24 DIAGNOSIS — N138 Other obstructive and reflux uropathy: Secondary | ICD-10-CM

## 2020-03-24 LAB — URINALYSIS, ROUTINE W REFLEX MICROSCOPIC
Bilirubin, UA: NEGATIVE
Glucose, UA: NEGATIVE
Ketones, UA: NEGATIVE
Leukocytes,UA: NEGATIVE
Nitrite, UA: NEGATIVE
Protein,UA: NEGATIVE
RBC, UA: NEGATIVE
Specific Gravity, UA: 1.015 (ref 1.005–1.030)
Urobilinogen, Ur: 0.2 mg/dL (ref 0.2–1.0)
pH, UA: 7 (ref 5.0–7.5)

## 2020-03-24 LAB — BLADDER SCAN AMB NON-IMAGING: Scan Result: 228

## 2020-03-24 MED ORDER — SILDENAFIL CITRATE 100 MG PO TABS: 100.0000 mg | ORAL_TABLET | ORAL | 6 refills | Status: DC | PRN

## 2020-03-24 MED ORDER — TAMSULOSIN HCL 0.4 MG PO CAPS: 0.4000 mg | ORAL_CAPSULE | Freq: Every day | ORAL | 3 refills | Status: DC

## 2020-03-24 NOTE — Progress Notes (Signed)
Bladder Scan Patient can void: 228 ml Performed By: Hayven Fatima,lpn Pt states he ran out of his flomax on Sunday.   Urological Symptom Review  Patient is experiencing the following symptoms: Get up at night to urinate Stream starts and stops Trouble starting stream Have to strain to urinate Weak stream   Review of Systems  Gastrointestinal (upper)  : Negative for upper GI symptoms  Gastrointestinal (lower) : Negative for lower GI symptoms  Constitutional : Fatigue  Skin: Negative for skin symptoms  Eyes: Negative for eye symptoms  Ear/Nose/Throat : Negative for Ear/Nose/Throat symptoms  Hematologic/Lymphatic: Negative for Hematologic/Lymphatic symptoms  Cardiovascular : Negative for cardiovascular symptoms  Respiratory : Shortness of breath  Endocrine: Negative for endocrine symptoms  Musculoskeletal: Negative for musculoskeletal symptoms  Neurological: Negative for neurological symptoms  Psychologic: Negative for psychiatric symptoms

## 2020-03-24 NOTE — Progress Notes (Signed)
03/24/2020 11:18 AM   Peter Mahoney 1960/09/19 [id number]  Referring provider: [name], [place],  [place] [address]  followup BPH and ED  HPI: Peter Mahoney is 59yo here for followup for BPH and ED. He has stable LUTS on flomax 0.4mg  daily. He ran out of flomax 5 day ago and noted worsening LUTS. PVR 228cc. He has a weaker stream, urinary urgency, nocturia 1-3x, hesitancy. No dysuria or hematuria.    PMH: Past Medical History:  Diagnosis Date   Dental crown present    GERD (gastroesophageal reflux disease)    Post-operative infection 09/2011   left leg   Seasonal allergies    Wound infection after surgery, left lateral leg 10/08/2011    Surgical History: Past Surgical History:  Procedure Laterality Date   BACK SURGERY  04/1995   L5-S1   COLONOSCOPY N/A 05/12/2015   Procedure: COLONOSCOPY;  Surgeon: Aviva Signs, MD;  [facility];  Service: Gastroenterology;  Laterality: N/A;   INCISION AND DRAINAGE OF WOUND  10/08/2011   Procedure: IRRIGATION AND DEBRIDEMENT WOUND;  Surgeon: Johnny Bridge, MD;  Location: [facility];  Service: Orthopedics;  Laterality: Left;  left left post op wound   KNEE ARTHROSCOPY  11/2010   left knee - meniscus tear   KNEE SURGERY  08/26/2011   hamstring tendon tear left   NM MYOVIEW LTD  11/17/2012   Exercise 7 minutes. EF 60%. No ischemia or infarction.; Low risk   TONSILLECTOMY     as a child   TRANSTHORACIC ECHOCARDIOGRAM  01/2016   Normal LV function EF 5560%. No regional wall motion abnormality. 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 Mild LA dilation. Otherwise normal    Home Medications:  Allergies as of 03/24/2020      Reactions   Dilaudid [hydromorphone Hcl] Other (See Comments)   Urinary retention- says with most opioids taken   Doxycycline Nausea And Vomiting   Oxycodone       Medication List       Accurate as of  March 24, 2020 11:18 AM. If you have any questions, ask your nurse or doctor.        albuterol 108 (90 Base) MCG/ACT inhaler Commonly known as: VENTOLIN HFA Inhale 2 puffs into the lungs every 4 (four) hours as needed for wheezing or shortness of breath.   diazepam 10 MG tablet Commonly known as: VALIUM 10 mg at bedtime.   RABEprazole 20 MG tablet Commonly known as: ACIPHEX Take 20 mg by mouth in the morning and at bedtime.   sildenafil 100 MG tablet Commonly known as: Viagra Take 1 tablet (100 mg total) by mouth daily as needed for erectile dysfunction.   tamsulosin 0.4 MG Caps capsule Commonly known as: FLOMAX Take 0.4 mg by mouth at bedtime.       Allergies:  Allergies  Allergen Reactions   Dilaudid [Hydromorphone Hcl] Other (See Comments)    Urinary retention- says with most opioids taken   Doxycycline Nausea And Vomiting   Oxycodone     Family History: Family History  Problem Relation Age of Onset   Heart Problems Mother    Stroke Mother    Pulmonary fibrosis Mother    Cancer - Lung Father        smoked   Heart failure Maternal Grandmother    Emphysema Maternal Grandfather        "his whole family had Emphysema"  Diabetes Paternal Grandmother    Cancer Paternal Grandfather     Social History:  reports that he quit smoking about 4 years ago. His smoking use included cigarettes. He started smoking about 40 years ago. He has a 35.00 pack-year smoking history. He has never used smokeless tobacco. He reports current alcohol use of about 2.0 standard drinks of alcohol per week. He reports that he does not use drugs.  ROS: All other review of systems were reviewed and are negative except what is noted above in HPI  Physical Exam: BP 103/63    Pulse 67    Temp 98.1 F (36.7 C)    Ht 5\' 11"  (1.803 m)    Wt 190 lb 9.6 oz (86.5 kg)    BMI 26.58 kg/m   Constitutional:  Alert and oriented, No acute distress. HEENT: [place] AT, moist mucus membranes.   Trachea midline, no masses. Cardiovascular: No clubbing, cyanosis, or edema. Respiratory: Normal respiratory effort, no increased work of breathing. GI: Abdomen is soft, nontender, nondistended, no abdominal masses GU: No CVA tenderness.  Lymph: No cervical or inguinal lymphadenopathy. Skin: No rashes, bruises or suspicious lesions. Neurologic: Grossly intact, no focal deficits, moving all 4 extremities. Psychiatric: Normal mood and affect.  Laboratory Data: Lab Results  Component Value Date   HGB 15.0 10/08/2011    Lab Results  Component Value Date   CREATININE 1.04 01/14/2019    No results found for: PSA  No results found for: TESTOSTERONE  No results found for: HGBA1C  Urinalysis No results found for: COLORURINE, APPEARANCEUR, LABSPEC, PHURINE, GLUCOSEU, HGBUR, BILIRUBINUR, KETONESUR, PROTEINUR, UROBILINOGEN, NITRITE, LEUKOCYTESUR  No results found for: LABMICR, [place], RBCUA, LABEPIT, MUCUS, BACTERIA  Pertinent Imaging:  No results found for this or any previous visit.  No results found for this or any previous visit.  No results found for this or any previous visit.  No results found for this or any previous visit.  No results found for this or any previous visit.  No results found for this or any previous visit.  No results found for this or any previous visit.  No results found for this or any previous visit.   Assessment & Plan:    1. Erectile dysfunction due to arterial insufficiency -sildenafil 100mg  PRN - Urinalysis, Routine w reflex microscopic  2. Weak urinary stream -flomax 0.4mg  daily - BLADDER SCAN AMB NON-IMAGING  3. Benign prostatic hyperplasia with urinary obstruction -flomax 0.4mg     No follow-ups on file.  Nicolette Bang, MD  [facility] [place]

## 2020-03-24 NOTE — Patient Instructions (Signed)

## 2020-03-25 LAB — PSA: Prostate Specific Ag, Serum: 1 ng/mL (ref 0.0–4.0)

## 2020-03-28 NOTE — Progress Notes (Signed)
Results mailed 

## 2020-04-06 NOTE — Telephone Encounter (Signed)
Forms received back from provider on 12/1, and faxed to [employer] on 12/1

## 2020-05-10 ENCOUNTER — Telehealth: Payer: Self-pay | Admitting: Emergency Medicine

## 2020-05-10 NOTE — Telephone Encounter (Signed)
Spoke with the pt  He is asking about status of his disability forms  He states that they should have been sent here in Nov 2021  I checked Dr Agustina Caroli lookat and do not see anything on this pt  Pt notified Patrice not likely back in the office this wk and she handles these forms and will send msg to check with her once she is back  Pt okay with this plan  Patrice, please advise on this thanks!!

## 2020-05-19 NOTE — Telephone Encounter (Signed)
Patient dropped off LTD forms - will give to Dr. Lamonte Sakai when he returns to clinic on 05/22/2020-pr

## 2020-05-24 DIAGNOSIS — Z0289 Encounter for other administrative examinations: Secondary | ICD-10-CM

## 2020-05-24 NOTE — Telephone Encounter (Signed)
Rec'd completed forms back - sent Kathlee Nations email to drop charge-pr

## 2020-05-26 DIAGNOSIS — M25511 Pain in right shoulder: Secondary | ICD-10-CM | POA: Diagnosis not present

## 2020-05-26 DIAGNOSIS — M25561 Pain in right knee: Secondary | ICD-10-CM | POA: Diagnosis not present

## 2020-05-26 DIAGNOSIS — M25512 Pain in left shoulder: Secondary | ICD-10-CM | POA: Diagnosis not present

## 2020-05-26 NOTE — Telephone Encounter (Signed)
Called and advised that paperwork is complete and has been faxed to [place] at [phone]. Mailed copy to patient per his request. Patient pd fee online. -pr

## 2020-05-30 DIAGNOSIS — M25512 Pain in left shoulder: Secondary | ICD-10-CM | POA: Diagnosis not present

## 2020-05-30 DIAGNOSIS — M25511 Pain in right shoulder: Secondary | ICD-10-CM | POA: Diagnosis not present

## 2020-06-02 DIAGNOSIS — M25512 Pain in left shoulder: Secondary | ICD-10-CM | POA: Diagnosis not present

## 2020-06-02 DIAGNOSIS — M25511 Pain in right shoulder: Secondary | ICD-10-CM | POA: Diagnosis not present

## 2020-06-02 DIAGNOSIS — M25561 Pain in right knee: Secondary | ICD-10-CM | POA: Diagnosis not present

## 2020-06-30 DIAGNOSIS — M542 Cervicalgia: Secondary | ICD-10-CM | POA: Diagnosis not present

## 2020-07-26 DIAGNOSIS — M542 Cervicalgia: Secondary | ICD-10-CM | POA: Diagnosis not present

## 2020-07-29 DIAGNOSIS — M542 Cervicalgia: Secondary | ICD-10-CM | POA: Diagnosis not present

## 2020-08-03 ENCOUNTER — Telehealth: Payer: Self-pay | Admitting: Emergency Medicine

## 2020-08-09 ENCOUNTER — Telehealth: Payer: Self-pay | Admitting: Acute Care

## 2020-08-09 DIAGNOSIS — M25511 Pain in right shoulder: Secondary | ICD-10-CM | POA: Diagnosis not present

## 2020-08-09 DIAGNOSIS — M542 Cervicalgia: Secondary | ICD-10-CM | POA: Diagnosis not present

## 2020-08-09 NOTE — Telephone Encounter (Signed)
I have spoke with Mr. Blackard and his LCS CT has been scheduled on 10/06/20 @ 8:00am at [facility]

## 2020-08-09 NOTE — Telephone Encounter (Signed)
Patient is returning phone call. Patient phone number is [phone].

## 2020-08-09 NOTE — Telephone Encounter (Signed)
I called and spoke with [place] notifying them we have received the forms and will give forms to Dr. Lamonte Sakai when he returns in the clinic on 04/26. [employer] verbalized understanding and thanked for update.

## 2020-08-09 NOTE — Telephone Encounter (Signed)
Called and spoke with patient. He stated he had called to speak with Patrice. Will route to patrice to call patient

## 2020-08-09 NOTE — Telephone Encounter (Signed)
Will forward to [facility]) to contact pt to schedule f/u low dose ct.

## 2020-08-09 NOTE — Telephone Encounter (Signed)
I have left a message asking the patient to call me back [phone]

## 2020-08-10 NOTE — Telephone Encounter (Signed)
Called and spoke to pt - advised I was just calling to ensure that his f/u and pft was scheduled per RB's recall. Patient states that Dr. Lamonte Sakai told him a pft was not necessary.  Dr. Lamonte Sakai - this patient is scheduled for a pft and f/u in June 2022 - do you want to hold off on the pft - pt states he has such a hard time breathing you told him that we wouldn't put him through doing a pft?   Please advise- Thanks [employer]

## 2020-08-14 NOTE — Telephone Encounter (Signed)
Called and spoke with patient, advised him that we are waiting on a response from Dr. Lamonte Sakai regarding the PFT and once we hear back from him we will call him back and let him know.  He verbalized understanding.

## 2020-08-14 NOTE — Telephone Encounter (Signed)
We can defer the repeat PFT for now, discuss at his next [employer] forms completed today

## 2020-08-14 NOTE — Telephone Encounter (Signed)
Placed signed paperwork on Peter Mahoney's desk.

## 2020-08-17 NOTE — Telephone Encounter (Signed)
Faxed form to [employer] attn Edwyna Shell at [phone]

## 2020-08-24 ENCOUNTER — Telehealth: Payer: Self-pay | Admitting: Emergency Medicine

## 2020-08-30 DIAGNOSIS — M25511 Pain in right shoulder: Secondary | ICD-10-CM | POA: Diagnosis not present

## 2020-08-31 NOTE — Telephone Encounter (Signed)
Patient forms was faxed on 08/17/2020 -pr

## 2020-09-19 ENCOUNTER — Other Ambulatory Visit (HOSPITAL_COMMUNITY)
Admission: RE | Admit: 2020-09-19 | Discharge: 2020-09-19 | Disposition: A | Discharge status: Home / Self Care | Payer: BC Managed Care – PPO | Source: Ambulatory Visit | Attending: Emergency Medicine | Admitting: Emergency Medicine

## 2020-09-20 ENCOUNTER — Ambulatory Visit (INDEPENDENT_AMBULATORY_CARE_PROVIDER_SITE_OTHER): Payer: BC Managed Care – PPO | Admitting: Emergency Medicine

## 2020-09-20 ENCOUNTER — Encounter: Payer: Self-pay | Admitting: Emergency Medicine

## 2020-09-20 ENCOUNTER — Other Ambulatory Visit: Payer: Self-pay | Admitting: Emergency Medicine

## 2020-09-20 ENCOUNTER — Other Ambulatory Visit: Payer: Self-pay

## 2020-09-20 DIAGNOSIS — J431 Panlobular emphysema: Secondary | ICD-10-CM

## 2020-09-20 DIAGNOSIS — R911 Solitary pulmonary nodule: Secondary | ICD-10-CM

## 2020-09-20 DIAGNOSIS — F17210 Nicotine dependence, cigarettes, uncomplicated: Secondary | ICD-10-CM | POA: Diagnosis not present

## 2020-09-20 MED ORDER — ALBUTEROL SULFATE HFA 108 (90 BASE) MCG/ACT IN AERS: 2.0000 | INHALATION_SPRAY | RESPIRATORY_TRACT | 5 refills | Status: DC | PRN

## 2020-09-20 MED ORDER — STIOLTO RESPIMAT 2.5-2.5 MCG/ACT IN AERS: 2.0000 | INHALATION_SPRAY | Freq: Every day | RESPIRATORY_TRACT | 0 refills | Status: DC

## 2020-09-20 NOTE — Progress Notes (Signed)
Subjective:    Patient ID: Peter Mahoney, male    DOB: 1960-08-15, 60 y.o.   MRN: [id number]  HPI 60 year old former smoker (45 pack years) who has been followed by Dr. Luan Pulling for COPD.  Also with pulmonary nodular disease for which he underwent serial CT scans of the chest 10/30/2015 through 08/05/2017, stable.  Quantitative alpha-1 antitrypsin done 05/30/2017 was 161 (normal), no genotype tested.  Most recent pulmonary function testing reviewed by me from 05/20/2018 shows normal airflows and volumes but curve to his flow volume loop.  He also has a significant defect in diffusion capacity.  He is doing Pulmonary Rehab at [facility]. Has not had any desaturations.   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   He has exertional SOB, also sometimes at rest. He has tried Trelegy, Anoro without effect. He has albuterol to use prn. Uses rarely and has had minimal effect. He has some daily cough, has phlegm, clear, happens every day. Last prednisone was a year ago.   Due for LDCT in June 2021, last scan was RADS 2  ROV 09/09/19 --60 year old gentleman who follows up today for severe emphysematous COPD with only mild obstruction on PFT.  He also has a history of pulmonary nodular disease it was deemed benign based on serial scans, now participates in the lung cancer screening program >> should repeat June '21.  At his last visit we decided to retry bronchodilators, started Spiriva Respimat.  He has been participating in the cardiopulmonary rehab, feels that his breathing is about the same. He stopped the Spiriva at 6 weeks. He doesn't notice improvement with SABA either.  He has had covid vaccines Being followed by cardiology for sinus brady  ROV 03/08/20 --60 year old man who has severe emphysematous COPD, mild obstruction by PFT.  Not currently on scheduled bronchodilator therapy because he did not feel that he got good benefit, tried Spiriva.  He has albuterol which he uses.  He  underwent cardiopulmonary rehab - unsure whether it helped his SOB, but his overall strength is better. He is still working out at home. Doing 0.5 mile on treadmill. Uses albuterol rarely. No flares, no abx or pred.  COVID-19 vaccine up-to-date, flu shot up-to-date, had the pneumonia vaccine in 2020.  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  His obstruction is mild by PFT, has tried Spiriva in the past but with minimal benefit so not currently on scheduled bronchodilator therapy.  Has albuterol that he uses occasionally - not every day, but increased from last visit.  Today he reports that he has noticed increased exertional SOB and an increase in clear chest phlegm that began to show up in January. After he breathes hard his throat will get dry, has some abdominal and chest muscle discomfort. He has tried albuterol without much change in breathing.   Participates in lung cancer screening program, due for his repeat scan 10/06/2020 to follow-up RADS-2 study.    Review of Systems As above  Past Medical History:  Diagnosis Date  . Dental crown present   . GERD (gastroesophageal reflux disease)   . Post-operative infection 09/2011   left leg  . Seasonal allergies   . Wound infection after surgery, left lateral leg 10/08/2011     Family History  Problem Relation Age of Onset  . Heart Problems Mother   . Stroke Mother   . Pulmonary fibrosis Mother   . Cancer - Lung Father  smoked  . Heart failure Maternal Grandmother   . Emphysema Maternal Grandfather        "his whole family had Emphysema"  . Diabetes Paternal Grandmother   . Cancer Paternal Grandfather      Social History   Socioeconomic History  . Marital status: Married    Spouse name: [name]  . Number of children: 2  . Years of education: 26  . Highest education level: Not on file  Occupational History    Employer: [employer]    Comment: [employer]  Tobacco Use  . Smoking status:  Former Smoker    Packs/day: 1.00    Years: 35.00    Pack years: 35.00    Types: Cigarettes    Start date: 43    Quit date: 11/01/2015    Years since quitting: 4.8  . Smokeless tobacco: Never Used  Vaping Use  . Vaping Use: Never used  Substance and Sexual Activity  . Alcohol use: Yes    Alcohol/week: 2.0 standard drinks    Types: 2 Standard drinks or equivalent per week    Comment: occasionally  . Drug use: No  . Sexual activity: Not on file  Other Topics Concern  . Not on file  Social History Narrative   Married father of 2, Grandfather of 3. Wife's name is [name].   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   Currently smokes a pack a day, and drinks maybe 2-3 alcohol beverages a week.         Epworth Sleepiness Scale      Score: 9      --I seem to be losing my sex drive   --I feel stressed and lack motivation   --I have COPD   --I have been told that I snore   --I am overweight or am gaining weight   --I awake feeling not rested   Social Determinants of Health   Financial Resource Strain: Not on file  Food Insecurity: Not on file  Transportation Needs: Not on file  Physical Activity: Not on file  Stress: Not on file  Social Connections: Not on file  Intimate Partner Violence: Not on file  [employer]  Cigarette company   Allergies  Allergen Reactions  . Dilaudid [Hydromorphone Hcl] Other (See Comments)    Urinary retention- says with most opioids taken  . Doxycycline Nausea And Vomiting  . Oxycodone      Outpatient Medications Prior to Visit  Medication Sig Dispense Refill  . albuterol (VENTOLIN HFA) 108 (90 Base) MCG/ACT inhaler Inhale 2 puffs into the lungs every 4 (four) hours as needed for wheezing or shortness of breath. 18 g 5  . diazepam (VALIUM) 10 MG tablet 10 mg at bedtime.     . RABEprazole (ACIPHEX) 20 MG tablet Take 20 mg by mouth in the morning and at bedtime.     .  sildenafil (VIAGRA) 100 MG tablet Take 1 tablet (100 mg total) by mouth as needed for erectile dysfunction. 30 tablet 6  . tamsulosin (FLOMAX) 0.4 MG CAPS capsule Take 1 capsule (0.4 mg total) by mouth daily after supper. 90 capsule 3   No facility-administered medications prior to visit.         Objective:   Physical Exam Vitals:   09/20/20 1047  BP: 118/74  Pulse: 86  Temp: 98 F (36.7 C)  TempSrc: Temporal  SpO2: 96%  Weight:  185 lb 8 oz (84.1 kg)  Height: 5' 10.5" (1.791 m)   Gen: Pleasant, well-nourished, in no distress,  normal affect  ENT: No lesions,  mouth clear,  oropharynx clear, no postnasal drip  Neck: No JVD, no stridor  Lungs: No use of accessory muscles, distant, no crackles or wheezing on normal respiration, no wheeze on forced expiration  Cardiovascular: RRR, heart sounds normal, no murmur or gallops, no peripheral edem  Musculoskeletal: No deformities, no cyanosis or clubbing  Neuro: alert, awake, non focal  Skin: Warm, no lesions or rash       Assessment & Plan:  COPD with emphysema ([place]) There is been a slow progressive worsening in his overall functional capacity, breathing since January.  I think we need to retry maintenance bronchodilator therapy.  We will start Stiolto to see if he gets benefit.  His COPD phenotype is more emphysematous.  Minimal obstruction on his pulmonary function testing.  May need to perform walking oximetry at some point going forward, repeat his PFT certainly if he does not benefit from the [facility]  If no change in his PFT and no benefit from BD then we may need to expand work-up, discuss with his cardiologist.  Pulmonary nodule Now participates in lung cancer screening program.  Next scan due this month.  Cigarette smoker Stopped in 2017.  Lung cancer screening CT chest this month.  Baltazar Apo, MD, PhD 09/20/2020, 11:10 AM [facility] [phone] or if no answer [phone]

## 2020-09-20 NOTE — Assessment & Plan Note (Signed)
Stopped in 2017.  Lung cancer screening CT chest this month.

## 2020-09-20 NOTE — Addendum Note (Signed)
Addended by: Gavin Potters R on: 09/20/2020 11:19 AM   Modules accepted: Orders

## 2020-09-20 NOTE — Assessment & Plan Note (Signed)
There is been a slow progressive worsening in his overall functional capacity, breathing since January.  I think we need to retry maintenance bronchodilator therapy.  We will start Stiolto to see if he gets benefit.  His COPD phenotype is more emphysematous.  Minimal obstruction on his pulmonary function testing.  May need to perform walking oximetry at some point going forward, repeat his PFT certainly if he does not benefit from the [facility].  If no change in his PFT and no benefit from BD then we may need to expand work-up, discuss with his cardiologist.

## 2020-09-20 NOTE — Assessment & Plan Note (Signed)
Now participates in lung cancer screening program.  Next scan due this month.

## 2020-09-20 NOTE — Patient Instructions (Addendum)
We will try starting Stiolto 2 puffs once daily.  Take this reliably every day and keep track of whether you feel like it benefits your breathing. Keep your albuterol available to use 2 puffs when you need it for shortness of breath, chest tightness, wheezing. Depending on how your breathing progresses we may decide to repeat your pulmonary function testing Okay to continue to use guaifenesin, either Mucinex or Robitussin as you need it to loosen mucus and help with your cough. Get your lung cancer screening CT scan this month as planned. Follow with Dr Lamonte Sakai in 1 month or next available

## 2020-09-20 NOTE — Addendum Note (Signed)
Addended by: Gavin Potters R on: 09/20/2020 01:56 PM   Modules accepted: Orders

## 2020-09-27 IMAGING — CT CT CHEST LUNG CANCER SCREENING LOW DOSE
1 of 3 series · 10 of 30 positions shown, 13 images · non-contrast
Comparison: Chest CT 08/05/2017.

CLINICAL DATA: 58-year-old male former smoker (quit in 4944) with
40 pack-year history of smoking. Lung cancer screening examination.

EXAM:
CT CHEST WITHOUT CONTRAST LOW-DOSE FOR LUNG CANCER SCREENING
TECHNIQUE: Multidetector CT imaging of the chest was performed following the
standard protocol without IV contrast.

[ct lung segmentation data · axial · 0.66mm/px · z∈[+1291,+1291]mm · 10 of 288 frames shown]
[frame 1/288  mediastinal]
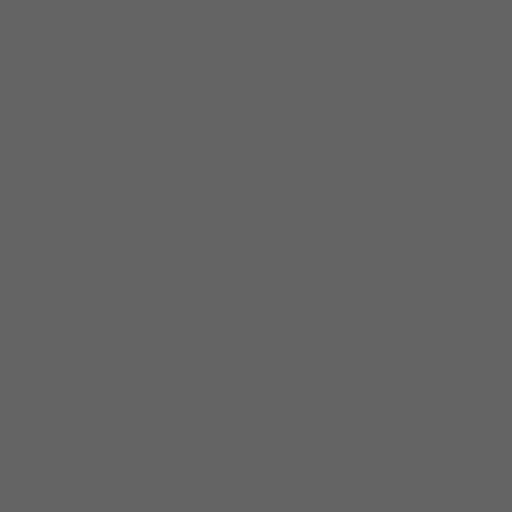
[frame 1/288  lung]
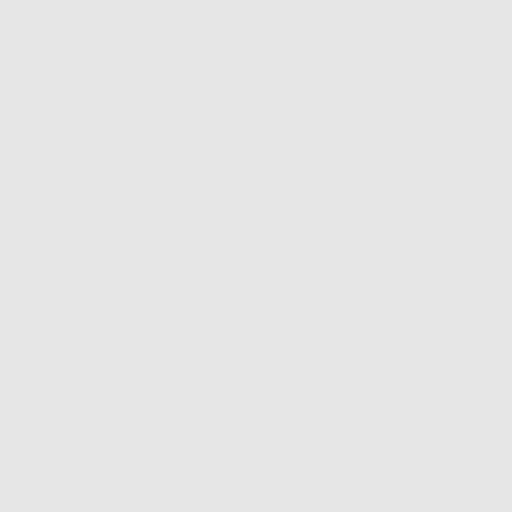
[frame 32/288  lung]
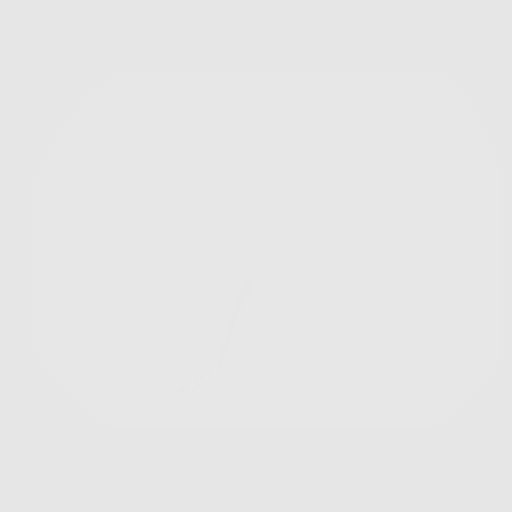
[frame 64/288  lung]
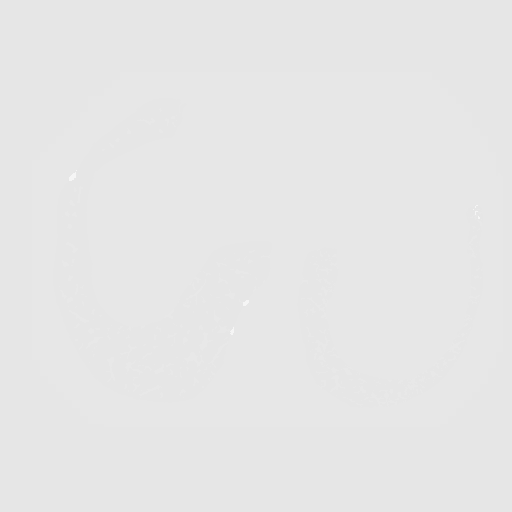
[frame 96/288  lung]
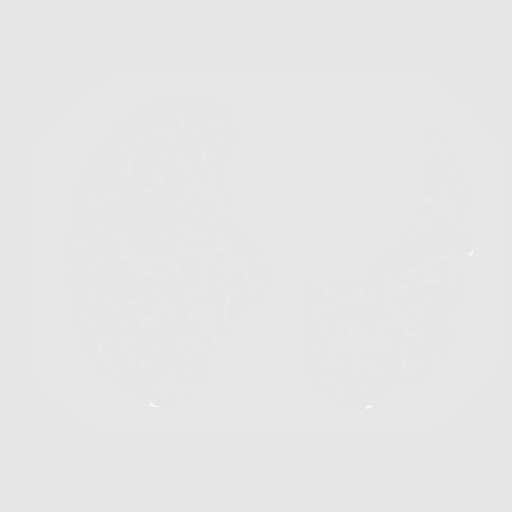
[frame 128/288  mediastinal]
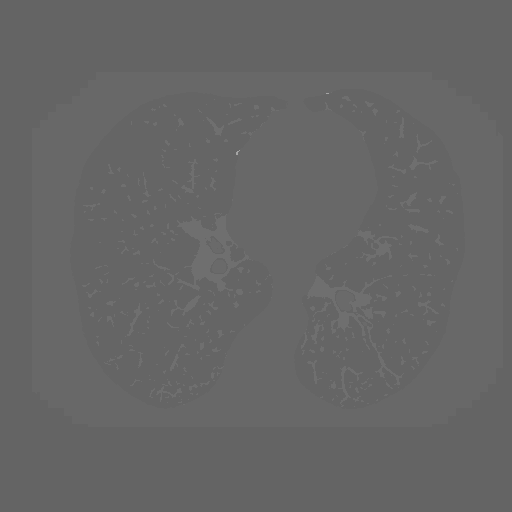
[frame 128/288  lung]
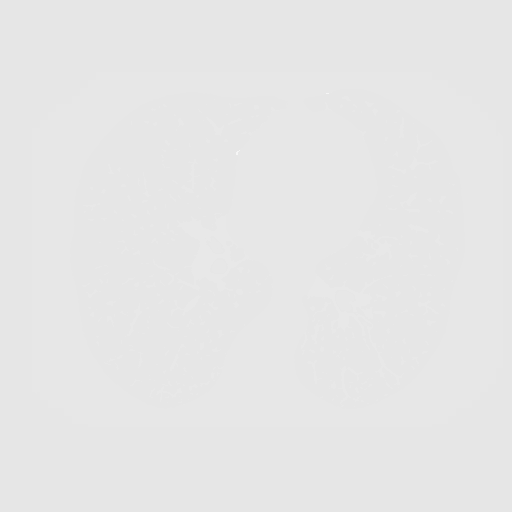
[frame 160/288  lung]
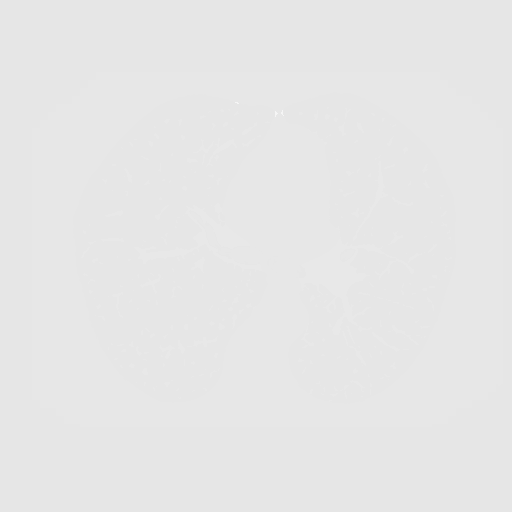
[frame 192/288  lung]
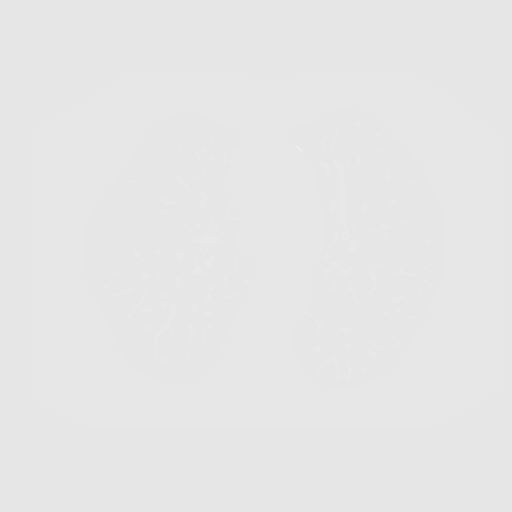
[frame 224/288  lung]
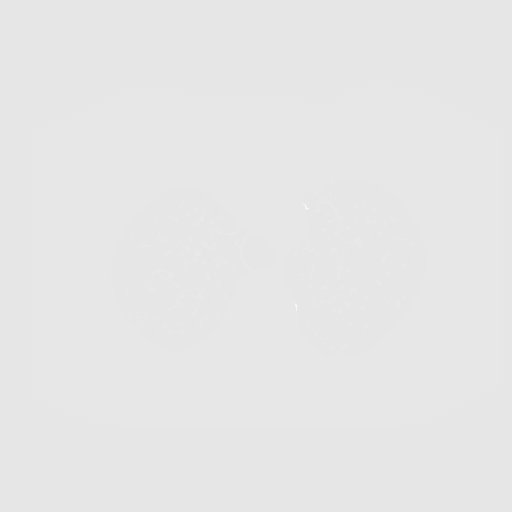
[frame 256/288  mediastinal]
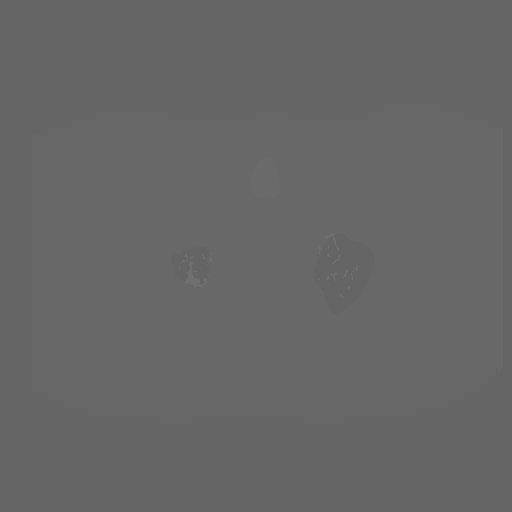
[frame 256/288  lung]
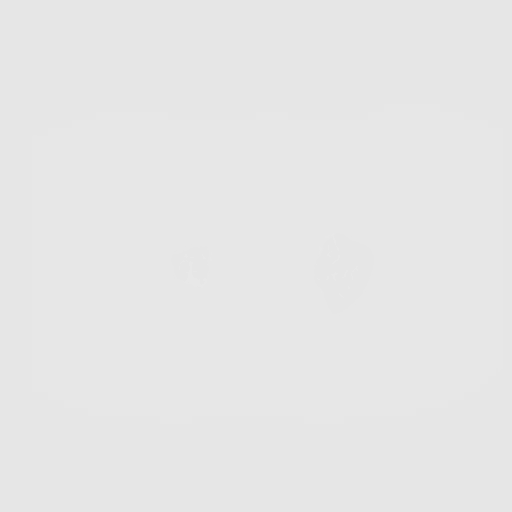
[frame 288/288  lung]
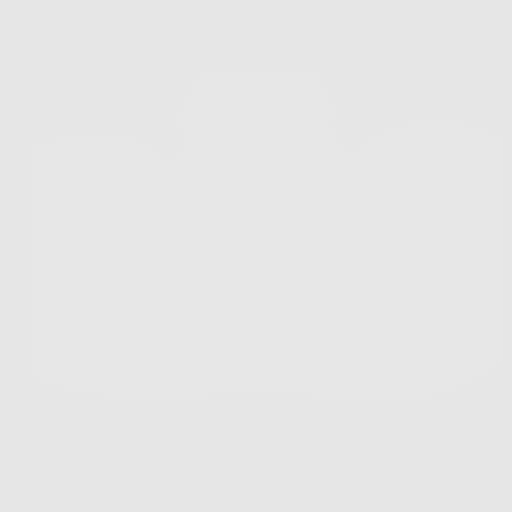

[10 of 30 positions shown; findings below may reference images not displayed]

FINDINGS: Cardiovascular: Heart size is normal. There is no significant
pericardial fluid, thickening or pericardial calcification. No
atherosclerotic calcifications in the thoracic aorta or the coronary
arteries.

Mediastinum/Nodes: No pathologically enlarged mediastinal or hilar
lymph nodes. Please note that accurate exclusion of hilar adenopathy
is limited on noncontrast CT scans. Esophagus is unremarkable in
appearance. No axillary lymphadenopathy.

Lungs/Pleura: Small pulmonary nodules in the right lung, largest of
which is in the inferior aspect of the right upper lobe abutting the
minor fissure (axial image 178 of series 3), with a volume derived
mean diameter of 6.7 mm, similar to prior examinations. No larger
more suspicious appearing pulmonary nodules or masses are noted. No
acute consolidative airspace disease. No pleural effusions. Mild
diffuse bronchial wall thickening with mild to moderate
centrilobular and paraseptal emphysema.

Upper Abdomen: Unremarkable.

Musculoskeletal: There are no aggressive appearing lytic or blastic
lesions noted in the visualized portions of the skeleton.
IMPRESSION: 1. Lung-RADS 2, benign appearance or behavior. Continue annual
screening with low-dose chest CT without contrast in 12 months.
2. Mild diffuse bronchial wall thickening with mild to moderate
centrilobular and paraseptal emphysema; imaging findings suggestive
of underlying COPD.

Emphysema ([id number]).

## 2020-10-06 ENCOUNTER — Ambulatory Visit (HOSPITAL_COMMUNITY)
Admission: RE | Admit: 2020-10-06 | Discharge: 2020-10-06 | Disposition: A | Discharge status: Home / Self Care | Payer: BC Managed Care – PPO | Source: Ambulatory Visit | Attending: Acute Care | Admitting: Acute Care

## 2020-10-06 DIAGNOSIS — Z87891 Personal history of nicotine dependence: Secondary | ICD-10-CM | POA: Insufficient documentation

## 2020-10-17 ENCOUNTER — Encounter: Payer: Self-pay | Admitting: *Deleted

## 2020-10-17 DIAGNOSIS — Z87891 Personal history of nicotine dependence: Secondary | ICD-10-CM

## 2020-10-17 NOTE — Progress Notes (Signed)
Please call patient and let them  know their  low dose Ct was read as a Lung RADS 2: nodules that are benign in appearance and behavior with a very low likelihood of becoming a clinically active cancer due to size or lack of growth. Recommendation per radiology is for a repeat LDCT in 12 months. .Please let them  know we will order and schedule their  annual screening scan for 09/2021. Please let them  know there was notation of CAD on their  scan.  Please remind the patient  that this is a non-gated exam therefore degree or severity of disease  cannot be determined. Please have them  follow up with their PCP regarding potential risk factor modification, dietary therapy or pharmacologic therapy if clinically indicated. Pt.  is not  currently on statin therapy. Please place order for annual  screening scan for  09/2021 and fax results to PCP. Thanks so much. Aortic atherosclerosis was noted. No statin in Epic noted. He had a echo in 2020.Just make sure he follows up with PCP. Thanks so much

## 2020-10-20 ENCOUNTER — Other Ambulatory Visit: Payer: Self-pay

## 2020-10-20 ENCOUNTER — Ambulatory Visit (INDEPENDENT_AMBULATORY_CARE_PROVIDER_SITE_OTHER): Payer: BC Managed Care – PPO | Admitting: Emergency Medicine

## 2020-10-20 ENCOUNTER — Encounter: Payer: Self-pay | Admitting: Emergency Medicine

## 2020-10-20 DIAGNOSIS — J431 Panlobular emphysema: Secondary | ICD-10-CM | POA: Diagnosis not present

## 2020-10-20 DIAGNOSIS — F17210 Nicotine dependence, cigarettes, uncomplicated: Secondary | ICD-10-CM

## 2020-10-20 NOTE — Assessment & Plan Note (Signed)
Still dyspneic with regular activity.  No real benefit from Spiriva (again).  He does use albuterol but with little effect.  It would appear that his COPD is much more emphysematous than bronchospastic.  He did not desaturate when he participated in pulmonary rehab in the last year.  I think we can stop the Spiriva, acknowledge that he may need scheduled bronchodilators at some point going forward.  He needs to work on his exercise and conditioning, optimize his functional capacity.  Continue use albuterol as needed.  Not sure that there is any clear benefit to be had from performing cardiopulmonary exercise testing or repeat PFT at this time.  He follows with cardiology annually

## 2020-10-20 NOTE — Patient Instructions (Signed)
Stop Stiolto Keep albuterol available to use 2 puffs when needed for shortness of breath, chest tightness, wheezing. Agree with upping her exercise slowly and steadily.  Work on [employer] manager, stamina and functional capacity. Continue to follow with cardiology annually.  If shortness of breath progresses then I believe you need to follow with them, consider repeat echocardiogram or stress testing. We will plan to repeat your lung cancer screening CT scan of the chest in June 2023 Follow with Dr Lamonte Sakai in 6 months or sooner if you have any problems

## 2020-10-20 NOTE — Progress Notes (Signed)
Subjective:    Patient ID: Peter Mahoney, male    DOB: 31-Dec-1960, 60 y.o.   MRN: [id number]  HPI 82--year-old former smoker (45 pack years) who has been followed by Dr. Luan Pulling for COPD.  Also with pulmonary nodular disease for which he underwent serial CT scans of the chest 10/30/2015 through 08/05/2017, stable.  Quantitative alpha-1 antitrypsin done 05/30/2017 was 161 (normal), no genotype tested.  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  His obstruction is mild by PFT, has tried Spiriva in the past but with minimal benefit so not currently on scheduled bronchodilator therapy.  Has albuterol that he uses occasionally - not every day, but increased from last visit.  Today he reports that he has noticed increased exertional SOB and an increase in clear chest phlegm that began to show up in January. After he breathes hard his throat will get dry, has some abdominal and chest muscle discomfort. He has tried albuterol without much change in breathing.   Participates in lung cancer screening program, due for his repeat scan 10/06/2020 to follow-up RADS-2 study.   ROV 10/20/20 --Peter Mahoney is 54, follows up today for history of significant emphysema, mild obstruction on pulmonary function testing.  He has tried Spiriva in the past without much benefit.  Last month he reported increased exertional dyspnea, increased mucus production.  We retried Spiriva at that time.  He returns to discuss. He reports no real improvement in his SOB. He did have some decrease in mucous but still has a dry cough. Some abd and rib soreness, ? Related to his WOB.  Reassuring cardiac CT and TTE in 2020.  He asked today about insurance paperwork  Lung cancer screening CT 10/06/2020 reviewed by me, shows centrilobular and paraseptal emphysema, stable previously identified small pulmonary nodules without any new nodules or masses.  RADS 2 study   Review of Systems As above  Past Medical History:   Diagnosis Date   Dental crown present    GERD (gastroesophageal reflux disease)    Post-operative infection 09/2011   left leg   Seasonal allergies    Wound infection after surgery, left lateral leg 10/08/2011     Family History  Problem Relation Age of Onset   Heart Problems Mother    Stroke Mother    Pulmonary fibrosis Mother    Cancer - Lung Father        smoked   Heart failure Maternal Grandmother    Emphysema Maternal Grandfather        "his whole family had Emphysema"   Diabetes Paternal Grandmother    Cancer Paternal Grandfather      Social History   Socioeconomic History   Marital status: Married    Spouse name: [name]   Number of children: 2   Years of education: 12   Highest education level: Not on file  Occupational History    Employer: [employer]    Comment: [employer]  Tobacco Use   Smoking status: Former    Packs/day: 1.00    Years: 35.00    Pack years: 35.00    Types: Cigarettes    Start date: 70    Quit date: 11/01/2015    Years since quitting: 4.9   Smokeless tobacco: Never  Vaping Use   Vaping Use: Never used  Substance and Sexual Activity   Alcohol use: Yes    Alcohol/week: 2.0 standard drinks    Types: 2 Standard drinks or equivalent per week  Comment: occasionally   Drug use: No   Sexual activity: Not on file  Other Topics Concern   Not on file  Social History Narrative   Married father of 2, Grandfather of 3. Wife's name is [name].   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   Currently smokes a pack a day, and drinks maybe 2-3 alcohol beverages a week.         Epworth Sleepiness Scale      Score: 9      --I seem to be losing my sex drive   --I feel stressed and lack motivation   --I have COPD   --I have been told that I snore   --I am overweight or am gaining weight   --I awake feeling not rested   Social Determinants of Health   Financial Resource Strain: Not  on file  Food Insecurity: Not on file  Transportation Needs: Not on file  Physical Activity: Not on file  Stress: Not on file  Social Connections: Not on file  Intimate Partner Violence: Not on file  [employer]  Cigarette company   Allergies  Allergen Reactions   Dilaudid [Hydromorphone Hcl] Other (See Comments)    Urinary retention- says with most opioids taken   Doxycycline Nausea And Vomiting   Oxycodone      Outpatient Medications Prior to Visit  Medication Sig Dispense Refill   albuterol (VENTOLIN HFA) 108 (90 Base) MCG/ACT inhaler Inhale 2 puffs into the lungs every 6 (six) hours as needed for wheezing or shortness of breath. 18 g 5   diazepam (VALIUM) 10 MG tablet 10 mg at bedtime.      RABEprazole (ACIPHEX) 20 MG tablet Take 20 mg by mouth in the morning and at bedtime.      sildenafil (VIAGRA) 100 MG tablet Take 1 tablet (100 mg total) by mouth as needed for erectile dysfunction. 30 tablet 6   tamsulosin (FLOMAX) 0.4 MG CAPS capsule Take 1 capsule (0.4 mg total) by mouth daily after supper. 90 capsule 3   Tiotropium Bromide-Olodaterol (STIOLTO RESPIMAT) 2.5-2.5 MCG/ACT AERS Inhale 2 puffs into the lungs daily. 4 g 0   No facility-administered medications prior to visit.         Objective:   Physical Exam Vitals:   10/20/20 0909  BP: 122/70  Pulse: 65  SpO2: 97%    Gen: Pleasant, well-nourished, in no distress,  normal affect  ENT: No lesions,  mouth clear,  oropharynx clear, no postnasal drip  Neck: No JVD, no stridor  Lungs: No use of accessory muscles, distant, no crackles or wheezing on normal respiration, no wheeze on forced expiration  Cardiovascular: RRR, heart sounds normal, no murmur or gallops, no peripheral edem  Musculoskeletal: No deformities, no cyanosis or clubbing  Neuro: alert, awake, non focal  Skin: Warm, no lesions or rash       Assessment & Plan:  COPD with emphysema ([place]) Still dyspneic with regular activity.  No  real benefit from Spiriva (again).  He does use albuterol but with little effect.  It would appear that his COPD is much more emphysematous than bronchospastic.  He did not desaturate when he participated in pulmonary rehab in the last year.  I think we can stop the Spiriva, acknowledge that he may need scheduled bronchodilators at some point going forward.  He needs to work on his exercise and conditioning, optimize his functional  capacity.  Continue use albuterol as needed.  Not sure that there is any clear benefit to be had from performing cardiopulmonary exercise testing or repeat PFT at this time.  He follows with cardiology annually  Cigarette smoker Former tobacco use.  Participating in lung cancer screening program, most recent scan reviewed, was a RADS 2 study.  He needs a repeat in June 2023.   Time spent with patient, reviewing data, discussing plans, adjusting meds 36 minutes  Baltazar Apo, MD, PhD 10/20/2020, 9:32 AM [facility] [phone] or if no answer [phone]

## 2020-10-20 NOTE — Assessment & Plan Note (Signed)
Former tobacco use.  Participating in lung cancer screening program, most recent scan reviewed, was a RADS 2 study.  He needs a repeat in June 2023.

## 2020-10-31 ENCOUNTER — Telehealth: Payer: Self-pay | Admitting: Emergency Medicine

## 2020-10-31 NOTE — Telephone Encounter (Signed)
Call returned to patient, confirmed DOB. I made the patient aware I have checked all areas and I do not see any paper work in regards to this. He is going to give guardian a  call and have them re fax the paper work. I made him aware to be sure to have his case worker call and confirm that the paper work was received. Voiced understanding.     Nothing further needed at this time.

## 2020-11-02 DIAGNOSIS — J439 Emphysema, unspecified: Secondary | ICD-10-CM | POA: Diagnosis not present

## 2020-11-02 DIAGNOSIS — Z0001 Encounter for general adult medical examination with abnormal findings: Secondary | ICD-10-CM | POA: Diagnosis not present

## 2020-11-02 DIAGNOSIS — D485 Neoplasm of uncertain behavior of skin: Secondary | ICD-10-CM | POA: Diagnosis not present

## 2020-11-02 DIAGNOSIS — K219 Gastro-esophageal reflux disease without esophagitis: Secondary | ICD-10-CM | POA: Diagnosis not present

## 2020-11-02 DIAGNOSIS — Z6825 Body mass index (BMI) 25.0-25.9, adult: Secondary | ICD-10-CM | POA: Diagnosis not present

## 2020-11-02 DIAGNOSIS — Z1389 Encounter for screening for other disorder: Secondary | ICD-10-CM | POA: Diagnosis not present

## 2020-11-02 DIAGNOSIS — H0011 Chalazion right upper eyelid: Secondary | ICD-10-CM | POA: Diagnosis not present

## 2020-11-02 DIAGNOSIS — E663 Overweight: Secondary | ICD-10-CM | POA: Diagnosis not present

## 2020-11-02 DIAGNOSIS — Z1331 Encounter for screening for depression: Secondary | ICD-10-CM | POA: Diagnosis not present

## 2020-11-17 ENCOUNTER — Ambulatory Visit
Admission: RE | Admit: 2020-11-17 | Discharge: 2020-11-17 | Disposition: A | Discharge status: Home / Self Care | Payer: BC Managed Care – PPO | Source: Ambulatory Visit | Attending: Emergency Medicine | Admitting: Emergency Medicine

## 2020-11-17 ENCOUNTER — Other Ambulatory Visit: Payer: Self-pay

## 2020-11-17 VITALS — BP 128/79 | HR 82 | Temp 98.2°F | Resp 18

## 2020-11-17 DIAGNOSIS — S31801A Laceration without foreign body of unspecified buttock, initial encounter: Secondary | ICD-10-CM | POA: Diagnosis not present

## 2020-11-17 MED ORDER — MUPIROCIN 2 % EX OINT: 1.0000 "application " | TOPICAL_OINTMENT | Freq: Two times a day (BID) | CUTANEOUS | 0 refills | Status: DC

## 2020-11-17 NOTE — ED Triage Notes (Signed)
Pt reports he has a skin tear on his tail bone that first occurred 3 months ago and has occurred again.

## 2020-11-17 NOTE — Discharge Instructions (Addendum)
Wash site daily with warm water and mild soap Keep covered to avoid friction Take antibiotic as prescribed and to completion Follow up here or with PCP or wound management if symptoms persists Return or go to the ED if you have any new or worsening symptoms increased redness, swelling, pain, nausea, vomiting, fever, chills, etc..Marland Kitchen

## 2020-11-17 NOTE — ED Provider Notes (Signed)
[facility]   [id number] 11/17/20 Arrival Time: 12   [id number]  SUBJECTIVE:  Peter Mahoney is a 60 y.o. male who presents with a possible skin tear of his buttock. Acute on chronic onset.  Began 3 months ago, got better with neosporin, reoccurred a couple of weeks ago.  Attributes to mowing for long periods of time.  Has tried neosporin without relief.  Sore to the touch.  Worse with sitting.  Denies fever, chills,nausea, vomiting, and drainage.    ROS: As per HPI.  All other pertinent ROS negative.     Past Medical History:  Diagnosis Date   Dental crown present    GERD (gastroesophageal reflux disease)    Post-operative infection 09/2011   left leg   Seasonal allergies    Wound infection after surgery, left lateral leg 10/08/2011   Past Surgical History:  Procedure Laterality Date   BACK SURGERY  04/1995   L5-S1   COLONOSCOPY N/A 05/12/2015   Procedure: COLONOSCOPY;  Surgeon: Aviva Signs, MD;  Location[facility];  Service: Gastroenterology;  Laterality: N/A;   INCISION AND DRAINAGE OF WOUND  10/08/2011   Procedure: IRRIGATION AND DEBRIDEMENT WOUND;  Surgeon: Johnny Bridge, MD;  Location: [facility];  Service: Orthopedics;  Laterality: Left;  left left post op wound   KNEE ARTHROSCOPY  11/2010   left knee - meniscus tear   KNEE SURGERY  08/26/2011   hamstring tendon tear left   NM MYOVIEW LTD  11/17/2012   Exercise 7 minutes. EF 60%. No ischemia or infarction.; Low risk   TONSILLECTOMY     as a child   TRANSTHORACIC ECHOCARDIOGRAM  01/2016   Normal LV function EF 5560%. No regional wall motion abnormality. 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 Mild LA dilation. Otherwise normal   Allergies  Allergen Reactions   Dilaudid [Hydromorphone Hcl] Other (See Comments)    Urinary retention- says with most opioids taken   Doxycycline Nausea And Vomiting   Oxycodone    No current  facility-administered medications on file prior to encounter.   Current Outpatient Medications on File Prior to Encounter  Medication Sig Dispense Refill   albuterol (VENTOLIN HFA) 108 (90 Base) MCG/ACT inhaler Inhale 2 puffs into the lungs every 6 (six) hours as needed for wheezing or shortness of breath. 18 g 5   diazepam (VALIUM) 10 MG tablet 10 mg at bedtime.      RABEprazole (ACIPHEX) 20 MG tablet Take 20 mg by mouth in the morning and at bedtime.      sildenafil (VIAGRA) 100 MG tablet Take 1 tablet (100 mg total) by mouth as needed for erectile dysfunction. 30 tablet 6   tamsulosin (FLOMAX) 0.4 MG CAPS capsule Take 1 capsule (0.4 mg total) by mouth daily after supper. 90 capsule 3   Tiotropium Bromide-Olodaterol (STIOLTO RESPIMAT) 2.5-2.5 MCG/ACT AERS Inhale 2 puffs into the lungs daily. 4 g 0   Social History   Socioeconomic History   Marital status: Married    Spouse name: [name]   Number of children: 2   Years of education: 12   Highest education level: Not on file  Occupational History    Employer: [employer]    Comment: [employer]  Tobacco Use   Smoking status: Former    Packs/day: 1.00    Years: 35.00    Pack years: 35.00    Types: Cigarettes    Start date: 1981  Quit date: 11/01/2015    Years since quitting: 5.0   Smokeless tobacco: Never  Vaping Use   Vaping Use: Never used  Substance and Sexual Activity   Alcohol use: Yes    Alcohol/week: 2.0 standard drinks    Types: 2 Standard drinks or equivalent per week    Comment: occasionally   Drug use: No   Sexual activity: Not on file  Other Topics Concern   Not on file  Social History Narrative   Married father of 2, Grandfather of 3. Wife's name is [name].   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   Currently smokes a pack a day, and drinks maybe 2-3 alcohol beverages a week.         Epworth Sleepiness Scale      Score: 9      --I seem to be  losing my sex drive   --I feel stressed and lack motivation   --I have COPD   --I have been told that I snore   --I am overweight or am gaining weight   --I awake feeling not rested   Social Determinants of Health   Financial Resource Strain: Not on file  Food Insecurity: Not on file  Transportation Needs: Not on file  Physical Activity: Not on file  Stress: Not on file  Social Connections: Not on file  Intimate Partner Violence: Not on file   Family History  Problem Relation Age of Onset   Heart Problems Mother    Stroke Mother    Pulmonary fibrosis Mother    Cancer - Lung Father        smoked   Heart failure Maternal Grandmother    Emphysema Maternal Grandfather        "his whole family had Emphysema"   Diabetes Paternal Grandmother    Cancer Paternal Grandfather     OBJECTIVE:  Vitals:   11/17/20 1047  BP: 128/79  Pulse: 82  Resp: 18  Temp: 98.2 F (36.8 C)  SpO2: 96%     General appearance: alert; no distress Skin: declines chaperone: superficial skin tear/ abrasion to proximal aspect of gluteal cleft, no obvious bleeding or drainage, mildly TTP Psychological: alert and cooperative; normal mood and affect   ASSESSMENT & PLAN:  1. Tear of skin of buttock, initial encounter     Meds ordered this encounter  Medications   mupirocin ointment (BACTROBAN) 2 %    Sig: Apply 1 application topically 2 (two) times daily.    Dispense:  22 g    Refill:  0    Order Specific Question:   Supervising Provider    Answer:   Raylene Everts [id number]   Wash site daily with warm water and mild soap Keep covered to avoid friction Take antibiotic as prescribed and to completion Follow up here or with PCP or wound management if symptoms persists Return or go to the ED if you have any new or worsening symptoms increased redness, swelling, pain, nausea, vomiting, fever, chills, etc...   Reviewed expectations re: course of current medical issues. Questions  answered. Outlined signs and symptoms indicating need for more acute intervention. Patient verbalized understanding. After Visit Summary given.           Lestine Box, PA-C 11/17/20 1129

## 2020-11-27 ENCOUNTER — Telehealth: Payer: Self-pay | Admitting: Emergency Medicine

## 2020-11-27 NOTE — Telephone Encounter (Signed)
Pt stated that he physically dropped off a copy of the FMLA paperwork and is needing for it to be done ASAP being that he has to turn in the form within the next week. He stated that it has been ongoing since October 20, 2020. Did inform [place] and will route to her and Pat[place]. Pls regard; [phone]

## 2020-11-27 NOTE — Telephone Encounter (Signed)
I found FMLA forms that were emailed to [place] on 7/29 - emailed to her again requesting a status.

## 2020-12-03 NOTE — Telephone Encounter (Signed)
Forms completed and fwd to [place] via email for Dr. Lamonte Sakai to sign when he returns to the office on 12/05/2020-pr

## 2020-12-11 NOTE — Telephone Encounter (Signed)
Spoke to patient today - forms are still in Dr. Agustina Caroli office for signature.  I let Peter Mahoney know I understand the urgency and that I will call him once they are signed and faxed.

## 2020-12-13 ENCOUNTER — Telehealth: Payer: Self-pay | Admitting: Emergency Medicine

## 2020-12-13 NOTE — Telephone Encounter (Signed)
Disability forms signed by Dr. Lamonte Sakai for permanent long term disability.  Forms faxed to [place] at fax# [phone], included office notes from 6/1 and 10/20/2020.  Per nurse, Dr. Lamonte Sakai has called patient and discussed the paperwork with him.  I have mailed a copy of the signed documents to the patient.

## 2021-02-02 ENCOUNTER — Ambulatory Visit (INDEPENDENT_AMBULATORY_CARE_PROVIDER_SITE_OTHER): Payer: PPO | Admitting: Urology

## 2021-02-02 ENCOUNTER — Other Ambulatory Visit: Payer: Self-pay

## 2021-02-02 ENCOUNTER — Encounter: Payer: Self-pay | Admitting: Urology

## 2021-02-02 VITALS — BP 122/76 | HR 88

## 2021-02-02 DIAGNOSIS — N401 Enlarged prostate with lower urinary tract symptoms: Secondary | ICD-10-CM | POA: Diagnosis not present

## 2021-02-02 DIAGNOSIS — N138 Other obstructive and reflux uropathy: Secondary | ICD-10-CM | POA: Diagnosis not present

## 2021-02-02 DIAGNOSIS — R3912 Poor urinary stream: Secondary | ICD-10-CM | POA: Diagnosis not present

## 2021-02-02 DIAGNOSIS — N5201 Erectile dysfunction due to arterial insufficiency: Secondary | ICD-10-CM | POA: Diagnosis not present

## 2021-02-02 LAB — URINALYSIS, ROUTINE W REFLEX MICROSCOPIC
Bilirubin, UA: NEGATIVE
Glucose, UA: NEGATIVE
Ketones, UA: NEGATIVE
Leukocytes,UA: NEGATIVE
Nitrite, UA: NEGATIVE
Protein,UA: NEGATIVE
RBC, UA: NEGATIVE
Specific Gravity, UA: <1.005 — ABNORMAL LOW (ref 1.005–1.030)
Urobilinogen, Ur: 0.2 mg/dL (ref 0.2–1.0)
pH, UA: 5.5 (ref 5.0–7.5)

## 2021-02-02 LAB — BLADDER SCAN AMB NON-IMAGING: Scan Result: 91

## 2021-02-02 MED ORDER — SILDENAFIL CITRATE 100 MG PO TABS: 100.0000 mg | ORAL_TABLET | ORAL | 6 refills | Status: DC | PRN

## 2021-02-02 MED ORDER — TAMSULOSIN HCL 0.4 MG PO CAPS: 0.4000 mg | ORAL_CAPSULE | Freq: Every day | ORAL | 3 refills | Status: DC

## 2021-02-02 NOTE — Patient Instructions (Signed)
Benign Prostatic Hyperplasia Benign prostatic hyperplasia (BPH) is an enlarged prostate gland that is caused by the normal aging process and not by cancer. The prostate is a walnut-sized gland that is involved in the production of semen. It is located in front of the rectum and below the bladder. The bladder stores urine and the urethra is the tube that carries the urine out of the body. The prostate may get bigger as a man gets older. An enlarged prostate can press on the urethra. This can make it harder to pass urine. The build-up of urine in the bladder can cause infection. Back pressure and infection may progress to bladder damage and kidney (renal) failure. What are the causes? This condition is part of a normal aging process. However, not all men develop problems from this condition. If the prostate enlarges away from the urethra, urine flow will not be blocked. If it enlarges toward the urethra and compresses it, there will be problems passing urine. What increases the risk? This condition is more likely to develop in men over the age of 50 years. What are the signs or symptoms? Symptoms of this condition include: Getting up often during the night to urinate. Needing to urinate frequently during the day. Difficulty starting urine flow. Decrease in size and strength of your urine stream. Leaking (dribbling) after urinating. Inability to pass urine. This needs immediate treatment. Inability to completely empty your bladder. Pain when you pass urine. This is more common if there is also an infection. Urinary tract infection (UTI). How is this diagnosed? This condition is diagnosed based on your medical history, a physical exam, and your symptoms. Tests will also be done, such as: A post-void bladder scan. This measures any amount of urine that may remain in your bladder after you finish urinating. A digital rectal exam. In a rectal exam, your health care provider checks your prostate by  putting a lubricated, gloved finger into your rectum to feel the back of your prostate gland. This exam detects the size of your gland and any abnormal lumps or growths. An exam of your urine (urinalysis). A prostate specific antigen (PSA) screening. This is a blood test used to screen for prostate cancer. An ultrasound. This test uses sound waves to electronically produce a picture of your prostate gland. Your health care provider may refer you to a specialist in kidney and prostate diseases (urologist). How is this treated? Once symptoms begin, your health care provider will monitor your condition (active surveillance or watchful waiting). Treatment for this condition will depend on the severity of your condition. Treatment may include: Observation and yearly exams. This may be the only treatment needed if your condition and symptoms are mild. Medicines to relieve your symptoms, including: Medicines to shrink the prostate. Medicines to relax the muscle of the prostate. Surgery in severe cases. Surgery may include: Prostatectomy. In this procedure, the prostate tissue is removed completely through an open incision or with a laparoscope or robotics. Transurethral resection of the prostate (TURP). In this procedure, a tool is inserted through the opening at the tip of the penis (urethra). It is used to cut away tissue of the inner core of the prostate. The pieces are removed through the same opening of the penis. This removes the blockage. Transurethral incision (TUIP). In this procedure, small cuts are made in the prostate. This lessens the prostate's pressure on the urethra. Transurethral microwave thermotherapy (TUMT). This procedure uses microwaves to create heat. The heat destroys and removes a   small amount of prostate tissue. Transurethral needle ablation (TUNA). This procedure uses radio frequencies to destroy and remove a small amount of prostate tissue. Interstitial laser coagulation (ILC).  This procedure uses a laser to destroy and remove a small amount of prostate tissue. Transurethral electrovaporization (TUVP). This procedure uses electrodes to destroy and remove a small amount of prostate tissue. Prostatic urethral lift. This procedure inserts an implant to push the lobes of the prostate away from the urethra. Follow these instructions at home: Take over-the-counter and prescription medicines only as told by your health care provider. Monitor your symptoms for any changes. Contact your health care provider with any changes. Avoid drinking large amounts of liquid before going to bed or out in public. Avoid or reduce how much caffeine or alcohol you drink. Give yourself time when you urinate. Keep all follow-up visits as told by your health care provider. This is important. Contact a health care provider if: You have unexplained back pain. Your symptoms do not get better with treatment. You develop side effects from the medicine you are taking. Your urine becomes very dark or has a bad smell. Your lower abdomen becomes distended and you have trouble passing your urine. Get help right away if: You have a fever or chills. You suddenly cannot urinate. You feel lightheaded, or very dizzy, or you faint. There are large amounts of blood or clots in the urine. Your urinary problems become hard to manage. You develop moderate to severe low back or flank pain. The flank is the side of your body between the ribs and the hip. These symptoms may represent a serious problem that is an emergency. Do not wait to see if the symptoms will go away. Get medical help right away. Call your local emergency services (911 in the [place] Do not drive yourself to the hospital. Summary Benign prostatic hyperplasia (BPH) is an enlarged prostate that is caused by the normal aging process and not by cancer. An enlarged prostate can press on the urethra. This can make it hard to pass urine. This  condition is part of a normal aging process and is more likely to develop in men over the age of 50 years. Get help right away if you suddenly cannot urinate. This information is not intended to replace advice given to you by your health care provider. Make sure you discuss any questions you have with your health care provider. Document Revised: 07/19/2020 Document Reviewed: 12/16/2019 Elsevier Patient Education  2022 [employer]  

## 2021-02-02 NOTE — Progress Notes (Signed)
02/02/2021 12:09 PM   Peter Mahoney 1961/01/27 [id number]  Referring provider: [name], MD [address] [place],  [place] [address]  Followup BPH and erectile dysfunction   HPI: Peter Mahoney is a 60yo here for followup for BPH and erectile dysfunction. IPSS 10 QOL 2.  He is on flomax 0.4mg  daily. Urine stream strong. No straining to urinate. No hematuria or dysuria. Nocturia 1x. He takes sildenafil 100mg  PRN which works will for his erections. No other complaints today   PMH: Past Medical History:  Diagnosis Date   Dental crown present    GERD (gastroesophageal reflux disease)    Post-operative infection 09/2011   left leg   Seasonal allergies    Wound infection after surgery, left lateral leg 10/08/2011    Surgical History: Past Surgical History:  Procedure Laterality Date   BACK SURGERY  04/1995   L5-S1   COLONOSCOPY N/A 05/12/2015   Procedure: COLONOSCOPY;  Surgeon: Peter Signs, MD;  [facility];  Service: Gastroenterology;  Laterality: N/A;   INCISION AND DRAINAGE OF WOUND  10/08/2011   Procedure: IRRIGATION AND DEBRIDEMENT WOUND;  Surgeon: Peter Bridge, MD;  Location: [facility];  Service: Orthopedics;  Laterality: Left;  left left post op wound   KNEE ARTHROSCOPY  11/2010   left knee - meniscus tear   KNEE SURGERY  08/26/2011   hamstring tendon tear left   NM MYOVIEW LTD  11/17/2012   Exercise 7 minutes. EF 60%. No ischemia or infarction.; Low risk   TONSILLECTOMY     as a child   TRANSTHORACIC ECHOCARDIOGRAM  01/2016   Normal LV function EF 5560%. No regional wall motion abnormality. 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 Mild LA dilation. Otherwise normal    Home Medications:  Allergies as of 02/02/2021       Reactions   Dilaudid [hydromorphone Hcl] Other (See Comments)   Urinary retention- says with most opioids taken   Doxycycline Nausea And Vomiting   Oxycodone          Medication List        Accurate as of February 02, 2021 12:09 PM. If you have any questions, ask your nurse or doctor.          STOP taking these medications    Stiolto Respimat 2.5-2.5 MCG/ACT Aers Generic drug: Tiotropium Bromide-Olodaterol Stopped by: Peter Bang, MD       TAKE these medications    albuterol 108 (90 Base) MCG/ACT inhaler Commonly known as: VENTOLIN HFA Inhale 2 puffs into the lungs every 6 (six) hours as needed for wheezing or shortness of breath.   diazepam 10 MG tablet Commonly known as: VALIUM 10 mg at bedtime.   mupirocin ointment 2 % Commonly known as: BACTROBAN Apply 1 application topically 2 (two) times daily.   RABEprazole 20 MG tablet Commonly known as: ACIPHEX Take 20 mg by mouth in the morning and at bedtime.   sildenafil 100 MG tablet Commonly known as: Viagra Take 1 tablet (100 mg total) by mouth as needed for erectile dysfunction.   tamsulosin 0.4 MG Caps capsule Commonly known as: FLOMAX Take 1 capsule (0.4 mg total) by mouth daily after supper.        Allergies:  Allergies  Allergen Reactions   Dilaudid [Hydromorphone Hcl] Other (See Comments)    Urinary retention- says with most opioids taken   Doxycycline Nausea And Vomiting   Oxycodone  Family History: Family History  Problem Relation Age of Onset   Heart Problems Mother    Stroke Mother    Pulmonary fibrosis Mother    Cancer - Lung Father        smoked   Heart failure Maternal Grandmother    Emphysema Maternal Grandfather        "his whole family had Emphysema"   Diabetes Paternal Grandmother    Cancer Paternal Grandfather     Social History:  reports that he quit smoking about 5 years ago. His smoking use included cigarettes. He started smoking about 41 years ago. He has a 35.00 pack-year smoking history. He has never used smokeless tobacco. He reports current alcohol use of about 2.0 standard drinks per week. He reports that he does not  use drugs.  ROS: All other review of systems were reviewed and are negative except what is noted above in HPI  Physical Exam: BP 122/76   Pulse 88   Constitutional:  Alert and oriented, No acute distress. HEENT: [place] AT, moist mucus membranes.  Trachea midline, no masses. Cardiovascular: No clubbing, cyanosis, or edema. Respiratory: Normal respiratory effort, no increased work of breathing. GI: Abdomen is soft, nontender, nondistended, no abdominal masses GU: No CVA tenderness.  Lymph: No cervical or inguinal lymphadenopathy. Skin: No rashes, bruises or suspicious lesions. Neurologic: Grossly intact, no focal deficits, moving all 4 extremities. Psychiatric: Normal mood and affect.  Laboratory Data: Lab Results  Component Value Date   HGB 15.0 10/08/2011    Lab Results  Component Value Date   CREATININE 1.04 01/14/2019    No results found for: PSA  No results found for: TESTOSTERONE  No results found for: HGBA1C  Urinalysis    Component Value Date/Time   APPEARANCEUR Clear 03/24/2020 1026   GLUCOSEU Negative 03/24/2020 1026   BILIRUBINUR Negative 03/24/2020 1026   PROTEINUR Negative 03/24/2020 1026   NITRITE Negative 03/24/2020 1026   LEUKOCYTESUR Negative 03/24/2020 1026    Lab Results  Component Value Date   LABMICR Comment 03/24/2020    Pertinent Imaging:  No results found for this or any previous visit.  No results found for this or any previous visit.  No results found for this or any previous visit.  No results found for this or any previous visit.  No results found for this or any previous visit.  No results found for this or any previous visit.  No results found for this or any previous visit.  No results found for this or any previous visit.   Assessment & Plan:    1. Weak urinary stream -contineu flomax 0.4mg  daily - Urinalysis, Routine w reflex microscopic - BLADDER SCAN AMB NON-IMAGING  2. Benign prostatic hyperplasia with urinary  obstruction -Continue flomax 0.4mg  daily  3. Erectile dysfunction due to arterial insufficiency -sildenafil 100mg  prn    No follow-ups on file.  Peter Bang, MD  [facility] [place]

## 2021-02-02 NOTE — Progress Notes (Signed)
post void residual=91  Urological Symptom Review  Patient is experiencing the following symptoms: Erection problems  Sometimes...get up at night,trouble starting stream, and weak stream   Review of Systems  Gastrointestinal (upper)  : Indigestion/heartburn  Gastrointestinal (lower) : Negative for lower GI symptoms  Constitutional : Fatigue  Skin: Negative for skin symptoms  Eyes: Negative for eye symptoms  Ear/Nose/Throat : Negative for Ear/Nose/Throat symptoms  Hematologic/Lymphatic: Negative for Hematologic/Lymphatic symptoms  Cardiovascular : Negative for cardiovascular symptoms  Respiratory : Shortness of breath  Endocrine: Negative for endocrine symptoms  Musculoskeletal: Negative for musculoskeletal symptoms  Neurological: Headaches  Psychologic: Depression Anxiety

## 2021-02-15 ENCOUNTER — Ambulatory Visit (HOSPITAL_COMMUNITY)
Admission: RE | Admit: 2021-02-15 | Discharge: 2021-02-15 | Disposition: A | Discharge status: Home / Self Care | Payer: PPO | Source: Ambulatory Visit | Attending: Family Medicine | Admitting: Family Medicine

## 2021-02-15 ENCOUNTER — Other Ambulatory Visit (HOSPITAL_COMMUNITY): Payer: Self-pay | Admitting: Family Medicine

## 2021-02-15 ENCOUNTER — Other Ambulatory Visit: Payer: Self-pay

## 2021-02-15 DIAGNOSIS — M533 Sacrococcygeal disorders, not elsewhere classified: Secondary | ICD-10-CM | POA: Diagnosis not present

## 2021-02-15 DIAGNOSIS — M5388 Other specified dorsopathies, sacral and sacrococcygeal region: Secondary | ICD-10-CM

## 2021-02-15 DIAGNOSIS — Z6826 Body mass index (BMI) 26.0-26.9, adult: Secondary | ICD-10-CM | POA: Diagnosis not present

## 2021-02-15 DIAGNOSIS — M47816 Spondylosis without myelopathy or radiculopathy, lumbar region: Secondary | ICD-10-CM | POA: Diagnosis not present

## 2021-02-15 DIAGNOSIS — M26609 Unspecified temporomandibular joint disorder, unspecified side: Secondary | ICD-10-CM | POA: Diagnosis not present

## 2021-02-15 DIAGNOSIS — Z23 Encounter for immunization: Secondary | ICD-10-CM | POA: Diagnosis not present

## 2021-02-15 DIAGNOSIS — E663 Overweight: Secondary | ICD-10-CM | POA: Diagnosis not present

## 2021-02-15 DIAGNOSIS — K219 Gastro-esophageal reflux disease without esophagitis: Secondary | ICD-10-CM | POA: Diagnosis not present

## 2021-02-21 ENCOUNTER — Telehealth: Payer: Self-pay | Admitting: Emergency Medicine

## 2021-02-22 NOTE — Telephone Encounter (Signed)
Called and spoke with patient. He stated that he dropped off disability paperwork from his lawyers office on 02/05/21. He received a letter today from his lawyer stating they have not received anything from our office yet. He is aware that I will check with Darilyn to see if she has received anything and verbalized understanding.   Darilyn, have you received any paperwork for this patient? Please advise. Thanks!

## 2021-02-23 NOTE — Telephone Encounter (Signed)
Disability form completed by Dr. Lamonte Sakai, I faxed to Maurine Minister fax# [phone].  Called patient to let him know and then mailed him a copy of the completed form.

## 2021-03-01 ENCOUNTER — Telehealth: Payer: Self-pay | Admitting: Emergency Medicine

## 2021-03-01 NOTE — Progress Notes (Signed)
Cardiology Office Note    Date:  03/02/2021   ID:  Peter Mahoney, DOB 18-Sep-1960, MRN [id number]  PCP:  [name], MD  Cardiologist: Previously Dr. Bronson Ing --> Needs to switch to new MD  Chief Complaint  Patient presents with   Follow-up    Annual Visit    History of Present Illness:    Peter Mahoney is a 60 y.o. male with past medical history of coronary calcification by CT (Coronary CT in 12/2018 showing nonobstructive CAD with calcium score of 10), bradycardia, GERD and COPD who presents to the office today for annual follow-up.  He was last examined by Kerin Ransom, PA-C in 02/2020 and denied any recent chest pain or presyncope. Did have baseline dyspnea on exertion due to emphysema. He was continued on his current medications at that time and it was recommended to avoid AV nodal blocking agents given his baseline bradycardia.   In talking with the patient today, he reports having baseline dyspnea on exertion in the setting of his COPD but denies any acute changes in this. He does have inhalers but does not use them routinely as he does not notice significant improvement in his dyspnea with them. No associated chest pain or palpitations.  He denies any specific orthopnea, PND or lower extremity edema. He does check his vitals at home and says his heart rate is sometimes in the 40's to 50's but he is asymptomatic when this occurs.  Past Medical History:  Diagnosis Date   Dental crown present    GERD (gastroesophageal reflux disease)    Post-operative infection 09/21/2011   left leg   PTSD (post-traumatic stress disorder)    Seasonal allergies    Wound infection after surgery, left lateral leg 10/08/2011    Past Surgical History:  Procedure Laterality Date   BACK SURGERY  04/1995   L5-S1   COLONOSCOPY N/A 05/12/2015   Procedure: COLONOSCOPY;  Surgeon: Aviva Signs, MD;  [facility];  Service: Gastroenterology;  Laterality: N/A;   INCISION AND DRAINAGE OF  WOUND  10/08/2011   Procedure: IRRIGATION AND DEBRIDEMENT WOUND;  Surgeon: Johnny Bridge, MD;  Location: [facility];  Service: Orthopedics;  Laterality: Left;  left left post op wound   KNEE ARTHROSCOPY  11/2010   left knee - meniscus tear   KNEE SURGERY  08/26/2011   hamstring tendon tear left   NM MYOVIEW LTD  11/17/2012   Exercise 7 minutes. EF 60%. No ischemia or infarction.; Low risk   TONSILLECTOMY     as a child   TRANSTHORACIC ECHOCARDIOGRAM  01/2016   Normal LV function EF 5560%. No regional wall motion abnormality. 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 Mild LA dilation. Otherwise normal    Current Medications: Outpatient Medications Prior to Visit  Medication Sig Dispense Refill   albuterol (VENTOLIN HFA) 108 (90 Base) MCG/ACT inhaler Inhale 2 puffs into the lungs every 6 (six) hours as needed for wheezing or shortness of breath. 18 g 5   diazepam (VALIUM) 10 MG tablet 10 mg at bedtime.      mupirocin ointment (BACTROBAN) 2 % Apply 1 application topically 2 (two) times daily. 22 g 0   RABEprazole (ACIPHEX) 20 MG tablet Take 20 mg by mouth in the morning and at bedtime.      sildenafil (VIAGRA) 100 MG tablet Take 1 tablet (100 mg total) by mouth as needed for erectile dysfunction. 30 tablet  6   tamsulosin (FLOMAX) 0.4 MG CAPS capsule Take 1 capsule (0.4 mg total) by mouth daily after supper. 90 capsule 3   No facility-administered medications prior to visit.     Allergies:   Dilaudid [hydromorphone hcl], Doxycycline, and Oxycodone   Social History   Socioeconomic History   Marital status: Married    Spouse name: [name]   Number of children: 2   Years of education: 12   Highest education level: Not on file  Occupational History    Employer: [employer]    Comment: [employer]  Tobacco Use   Smoking status: Former    Packs/day: 1.00    Years: 35.00    Pack years: 35.00    Types: Cigarettes    Start  date: 61    Quit date: 11/01/2015    Years since quitting: 5.3   Smokeless tobacco: Never  Vaping Use   Vaping Use: Never used  Substance and Sexual Activity   Alcohol use: Yes    Alcohol/week: 2.0 standard drinks    Types: 2 Standard drinks or equivalent per week    Comment: occasionally   Drug use: No   Sexual activity: Not on file  Other Topics Concern   Not on file  Social History Narrative                                                Social Determinants of Health   Financial Resource Strain: Not on file  Food Insecurity: Not on file  Transportation Needs: Not on file  Physical Activity: Not on file  Stress: Not on file  Social Connections: Not on file     Family History:  The patient's family history includes Cancer in his paternal grandfather; [place] in his father; Diabetes in his paternal grandmother; Emphysema in his maternal grandfather; Heart Problems in his mother; Heart failure in his maternal grandmother; Pulmonary fibrosis in his mother; Stroke in his mother.   Review of Systems:    Please see the history of present illness.     All other systems reviewed and are otherwise negative except as noted above.   Physical Exam:    VS:  BP 120/78   Pulse 74   Ht 5\' 10"  (1.778 m)   Wt 191 lb 12.8 oz (87 kg)   SpO2 96%   BMI 27.52 kg/m    General: Well developed, well nourished,male appearing in no acute distress. Head: Normocephalic, atraumatic. Neck: No carotid bruits. JVD not elevated.  Lungs: Respirations regular and unlabored, without wheezes or rales.  Heart: Regular rate and rhythm. No S3 or S4.  No murmur, no rubs, or gallops appreciated. Abdomen: Appears non-distended. No obvious abdominal masses. Msk:  Strength and tone appear normal for age. No obvious joint deformities or effusions. Extremities: No clubbing or cyanosis. No pitting edema.  Distal pedal pulses are 2+ bilaterally. Neuro: Alert and oriented X 3. Moves all  extremities spontaneously. No focal deficits noted. Psych:  Responds to questions appropriately with a normal affect. Skin: No rashes or lesions noted  Wt Readings from Last 3 Encounters:  03/02/21 191 lb 12.8 oz (87 kg)  09/20/20 185 lb 8 oz (84.1 kg)  03/24/20 190 lb 9.6 oz (86.5 kg)     Studies/Labs Reviewed:   EKG:  EKG is ordered today.  The EKG ordered  today demonstrates NSR, HR 74 with no acute ST changes.   Recent Labs: No results found for requested labs within last 8760 hours.   Lipid Panel No results found for: CHOL, TRIG, HDL, CHOLHDL, VLDL, LDLCALC, LDLDIRECT  Additional studies/ records that were reviewed today include:   Event Monitor: 10/2018 NSR One asymptomatic run 4 beats WCT Symptoms of fatigue and dyspnea did not correlate with any arrhythmia  Echocardiogram: 12/2018 IMPRESSIONS     1. The left ventricle has normal systolic function with an ejection  fraction of 60-65%. The cavity size was normal. Left ventricular diastolic  Doppler parameters are indeterminate.   2. The right ventricle has normal systolic function. The cavity was  normal. There is no increase in right ventricular wall thickness.   Coronary CT: 12/2018 Calcium Score: 10.5 Agatston units.   Coronary Arteries: Right dominant with no anomalies   LM: Mixed plaque, no significant stenosis.   LAD system: Mixed plaque proximal LAD, no significant stenosis.   Circumflex system: No plaque or stenosis.   RCA system: No plaque or stenosis.   IMPRESSION: 1. Coronary artery calcium score 10.5 units. This places the patient in the 50th percentile for age and gender, suggesting intermediate risk for future cardiac events.   2.  Nonobstructive coronary disease.   Assessment:    1. Bradycardia   2. Coronary artery calcification seen on CT scan   3. Panlobular emphysema ([place])      Plan:   In order of problems listed above:  1. Bradycardia - His heart rate is in the 70's today  but has been in the 40's to 50's when checked at home intermittently but he is asymptomatic with this and denies any dizziness or presyncope. No indication for PPM placement at this time. Continue to avoid the use of AV nodal blocking agents.   2. Coronary Calcification on CT - Prior Coronary CT in 12/2018 showed nonobstructive CAD with calcium score of 10 as outlined above. He is not currently on statin therapy but says his LDL was elevated at 155 when checked earlier this year. He does wish to focus on dietary changes at this time. Will request a copy of most recent labs from his PCP.  3. COPD - He does have baseline dyspnea on exertion but no acute change. Uses Albuterol as needed.   Medication Adjustments/Labs and Tests Ordered: Current medicines are reviewed at length with the patient today.  Concerns regarding medicines are outlined above.  Medication changes, Labs and Tests ordered today are listed in the Patient Instructions below. Patient Instructions  Medication Instructions:   *If you need a refill on your cardiac medications before your next appointment, please call your pharmacy*   Lab Work:  We will request labs from your PCP.    Testing/Procedures:  None   Follow-Up: At [facility], you and your health needs are our priority.  As part of our continuing mission to provide you with exceptional heart care, we have created designated Provider Care Teams.  These Care Teams include your primary Cardiologist (physician) and Advanced Practice Providers (APPs -  Physician Assistants and Nurse Practitioners) who all work together to provide you with the care you need, when you need it.  We recommend signing up for the patient portal called "MyChart".  Sign up information is provided on this After Visit Summary.  MyChart is used to connect with patients for Virtual Visits (Telemedicine).  Patients are able to view lab/test results, encounter notes, upcoming appointments, etc.  Non-urgent messages can be sent to your provider as well.   To learn more about what you can do with MyChart, go to [email or web address].    Your next appointment:   1 year(s)  The format for your next appointment:   In Person  Provider:   You may see an MD or one of the following Advanced Practice Providers on your designated Care Team:   Bernerd Pho, PA-C  Ermalinda Barrios, PA-C {    Signed, Waynetta Pean  03/02/2021 4:49 PM    [place]. [address] [place], [place] [address] Phone: [phone] Fax: [phone]

## 2021-03-02 ENCOUNTER — Encounter: Payer: Self-pay | Admitting: Student

## 2021-03-02 ENCOUNTER — Encounter: Payer: Self-pay | Admitting: *Deleted

## 2021-03-02 ENCOUNTER — Ambulatory Visit: Payer: PPO | Admitting: Student

## 2021-03-02 ENCOUNTER — Other Ambulatory Visit: Payer: Self-pay

## 2021-03-02 VITALS — BP 120/78 | HR 74 | Ht 70.0 in | Wt 191.8 lb

## 2021-03-02 DIAGNOSIS — I2510 Atherosclerotic heart disease of native coronary artery without angina pectoris: Secondary | ICD-10-CM | POA: Diagnosis not present

## 2021-03-02 DIAGNOSIS — J431 Panlobular emphysema: Secondary | ICD-10-CM | POA: Diagnosis not present

## 2021-03-02 DIAGNOSIS — R001 Bradycardia, unspecified: Secondary | ICD-10-CM | POA: Diagnosis not present

## 2021-03-02 NOTE — Telephone Encounter (Signed)
I have printed off a copy of the forms that were submitted, will give to Dr. Agustina Caroli nurse to see if he will add what is requested to the form- informed heather.

## 2021-03-02 NOTE — Telephone Encounter (Signed)
Call made to patient, confirmed DOB. Patient states his lawyer is requesting that some wording needs to be updated on his disability paperwork.   His lawyer is requesting:  That question number #4 we add the wording:  "Or any occupation that he is qualified for."  Darilyn do you have a copy of this form? Thanks :)

## 2021-03-02 NOTE — Telephone Encounter (Signed)
Dr. Lamonte Sakai added requested wording- letter has been faxed to lawyer and patient informed. Patient states he does not need a copy as long as the lawyer has one. Nothing further needed.

## 2021-03-02 NOTE — Patient Instructions (Signed)
Medication Instructions:   *If you need a refill on your cardiac medications before your next appointment, please call your pharmacy*   Lab Work:  We will request labs from your PCP.    Testing/Procedures:  None   Follow-Up: At [facility], you and your health needs are our priority.  As part of our continuing mission to provide you with exceptional heart care, we have created designated Provider Care Teams.  These Care Teams include your primary Cardiologist (physician) and Advanced Practice Providers (APPs -  Physician Assistants and Nurse Practitioners) who all work together to provide you with the care you need, when you need it.  We recommend signing up for the patient portal called "MyChart".  Sign up information is provided on this After Visit Summary.  MyChart is used to connect with patients for Virtual Visits (Telemedicine).  Patients are able to view lab/test results, encounter notes, upcoming appointments, etc.  Non-urgent messages can be sent to your provider as well.   To learn more about what you can do with MyChart, go to [email or web address].    Your next appointment:   1 year(s)  The format for your next appointment:   In Person  Provider:   You may see an MD or one of the following Advanced Practice Providers on your designated Care Team:   [place], PA-C  Ermalinda Barrios, [place] {

## 2021-03-07 ENCOUNTER — Telehealth: Payer: Self-pay

## 2021-03-07 NOTE — Telephone Encounter (Signed)
Left a Voice Mail:  Patient is check on paperwork that was supposed to be filled out by Dr. Alyson Ingles.  Please advise.  Call back:  [phone]  Thanks, Helene Kelp

## 2021-03-08 NOTE — Telephone Encounter (Signed)
Please advise on this- I have no paper work for this patient.

## 2021-03-13 NOTE — Telephone Encounter (Signed)
Peter Mahoney, please advise. Thanks.

## 2021-03-13 NOTE — Telephone Encounter (Signed)
Per note dated 11/11 - paperwork was faxed to patient's lawyer's office and patient told us that he did not need a copy.  I spoke to him again a few minutes ago and confirmed his lawyer has rec'd the form and he does not need a copy.

## 2021-03-21 NOTE — Telephone Encounter (Signed)
Letter is written and placed on Dr. Noland Fordyce desk for review and signature.   If there are any changes that need to be made, the letter is saved to the desktop of the check in computer.  Patient needs to pick up the letter prior to Wednesday, 12/7.  Please call him when it is ready to pick up.

## 2021-03-21 NOTE — Telephone Encounter (Signed)
See below

## 2021-03-21 NOTE — Telephone Encounter (Signed)
Patient came into the office today inquiring about the status of the letter that he requested from Dr. Alyson Ingles on the date of his office visit on 02/02/21.  He gave him a sample with the required format.  Patient needs a [employer] Letter to support a [place] ([place] claim.  The letter needs to include a statement from Dr. Alyson Ingles that details the medical condition that we are treating him.    I found the sample and the forms on Dr. Noland Fordyce desk. I will attempt to create the letter (based on the sample letter that he supplied) for Peter Mahoney and have Dr. Alyson Ingles review it.  [name] can be reached at [phone].

## 2021-03-23 ENCOUNTER — Ambulatory Visit: Payer: BC Managed Care – PPO | Admitting: Urology

## 2021-03-28 NOTE — Telephone Encounter (Signed)
Form completed by MD and left at front desk for patient to pick up.

## 2021-04-27 ENCOUNTER — Other Ambulatory Visit: Payer: Self-pay

## 2021-04-27 ENCOUNTER — Ambulatory Visit: Payer: PPO | Admitting: Emergency Medicine

## 2021-04-27 ENCOUNTER — Encounter: Payer: Self-pay | Admitting: Emergency Medicine

## 2021-04-27 DIAGNOSIS — J431 Panlobular emphysema: Secondary | ICD-10-CM

## 2021-04-27 DIAGNOSIS — G4733 Obstructive sleep apnea (adult) (pediatric): Secondary | ICD-10-CM

## 2021-04-27 NOTE — Addendum Note (Signed)
Addended by: Gavin Potters R on: 04/27/2021 12:13 PM   Modules accepted: Orders

## 2021-04-27 NOTE — Assessment & Plan Note (Signed)
Snoring and borderline hypoxemia on a PSG from 2017.  His AHI was reassuring.  He never needed to start CPAP.  Still has daytime sleepiness, need to reassess.  He agrees to a split-night sleep study and we will arrange.  I will follow-up with him after it is done to review the results.

## 2021-04-27 NOTE — Patient Instructions (Signed)
We will arrange for a split-night sleep study to evaluate for sleep apnea, sleeping oxygen levels Keep your albuterol available use 2 puffs when needed for shortness of breath, chest tightness, wheezing. We will hold off on starting a scheduled inhaler medication for now. If your breathing changes we will consider retrying maintenance medicine and also repeat your walking oxygen testing. Follow with Peter Mahoney in 2 months or sooner if you have any problems.

## 2021-04-27 NOTE — Assessment & Plan Note (Signed)
Principally emphysematous change, has not responded significantly to bronchodilators.  He still has albuterol available to use if needed.  I will continue to surveillance amatory oximetry to ensure he does not qualify for oxygen going forward.  Also consider retrying maintenance BD if his dyspnea progresses.

## 2021-04-27 NOTE — Progress Notes (Signed)
Subjective:    Patient ID: Peter Mahoney, male    DOB: Sep 02, 1960, 61 y.o.   MRN: [id number]  HPI  ROV 04/27/21 --61 year old gentleman with a history of significant emphysema but mild associated obstruction.  He has not really responded significantly to Spiriva.  Little effect from albuterol.  He does not desaturate with ambulation.  He participates in lung cancer screening program, neck scan planned for June/2023.  He has been dealing with plantar fasciitis.  He has SOB w exertion, sputum production daily - thick and clear. His exertional tolerance is still limited. He snores, no witnesses apneas. He does have daytime sleepiness. Uses diazepam to initiate sleep. PSG 01/10/2016 >> AHI 0.2/h, SpO2 low was 89%.  Still uses albuterol prn, but makes little difference. Finished pulm rehab 2021, no desats there.    Review of Systems As above  Past Medical History:  Diagnosis Date   Dental crown present    GERD (gastroesophageal reflux disease)    Post-operative infection 09/21/2011   left leg   PTSD (post-traumatic stress disorder)    Seasonal allergies    Wound infection after surgery, left lateral leg 10/08/2011     Family History  Problem Relation Age of Onset   Heart Problems Mother    Stroke Mother    Pulmonary fibrosis Mother    Cancer - Lung Father        smoked   Heart failure Maternal Grandmother    Emphysema Maternal Grandfather        "his whole family had Emphysema"   Diabetes Paternal Grandmother    Cancer Paternal Grandfather      Social History   Socioeconomic History   Marital status: Married    Spouse name: [name]   Number of children: 2   Years of education: 12   Highest education level: Not on file  Occupational History    Employer: [employer]    Comment: [employer]  Tobacco Use   Smoking status: Former    Packs/day: 1.00    Years: 35.00    Pack years: 35.00    Types: Cigarettes    Start date: 87    Quit date: 11/01/2015    Years since quitting:  5.4   Smokeless tobacco: Never  Vaping Use   Vaping Use: Never used  Substance and Sexual Activity   Alcohol use: Yes    Alcohol/week: 2.0 standard drinks    Types: 2 Standard drinks or equivalent per week    Comment: occasionally   Drug use: No   Sexual activity: Not on file  Other Topics Concern   Not on file  Social History Narrative   Married father of 2, Grandfather of 3. Wife's name is [name].   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   Currently smokes a pack a day, and drinks maybe 2-3 alcohol beverages a week.         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 18 g 5   diazepam (VALIUM) 10 MG tablet 10 mg at bedtime.      mupirocin ointment (BACTROBAN) 2 % Apply 1 application topically 2 (two) times daily. 22 g 0   RABEprazole (ACIPHEX) 20 MG tablet Take 20 mg by mouth in the morning and at bedtime.      sildenafil (VIAGRA) 100 MG tablet Take 1 tablet (100 mg total) by mouth as needed for erectile  dysfunction. 30 tablet 6   tamsulosin (FLOMAX) 0.4 MG CAPS capsule Take 1 capsule (0.4 mg total) by mouth daily after supper. 90 capsule 3   No facility-administered medications prior to visit.         Objective:   Physical Exam Vitals:   04/27/21 1054  BP: 116/76  Pulse: 78  Temp: 98.3 F (36.8 C)  TempSrc: Oral  SpO2: 98%  Weight: 194 lb 9.6 oz (88.3 kg)  Height: 5\' 10"  (1.778 m)    Gen: Pleasant, well-nourished, in no distress,  normal affect  ENT: No lesions,  mouth clear,  oropharynx clear, no postnasal drip  Neck: No JVD, no stridor  Lungs: No use of accessory muscles, distant, no crackles or wheezing on normal respiration, no wheeze on forced expiration  Cardiovascular: RRR, heart sounds normal, no murmur or gallops, no peripheral edem  Musculoskeletal: No deformities, no cyanosis or clubbing  Neuro: alert, awake, non focal  Skin: Warm, no lesions or rash       Assessment & Plan:  COPD with emphysema ([place]) Principally emphysematous change, has not responded significantly to bronchodilators.  He still has albuterol available to use if needed.  I will continue to surveillance amatory oximetry to ensure he does not qualify for oxygen going forward.  Also consider retrying maintenance BD if his dyspnea progresses.  Sleep apnea Snoring and borderline hypoxemia on a PSG from 2017.  His AHI was reassuring.  He never needed to start CPAP.  Still has daytime sleepiness, need to reassess.  He agrees to a split-night sleep study and we will arrange.  I will follow-up with him after it is done to review the results.     Baltazar Apo, MD, PhD 04/27/2021, 11:24 AM [facility] [phone] or if no answer [phone]

## 2021-05-17 DIAGNOSIS — G4700 Insomnia, unspecified: Secondary | ICD-10-CM | POA: Diagnosis not present

## 2021-05-17 DIAGNOSIS — J449 Chronic obstructive pulmonary disease, unspecified: Secondary | ICD-10-CM | POA: Diagnosis not present

## 2021-05-17 DIAGNOSIS — Z6826 Body mass index (BMI) 26.0-26.9, adult: Secondary | ICD-10-CM | POA: Diagnosis not present

## 2021-05-17 DIAGNOSIS — E663 Overweight: Secondary | ICD-10-CM | POA: Diagnosis not present

## 2021-05-17 DIAGNOSIS — K219 Gastro-esophageal reflux disease without esophagitis: Secondary | ICD-10-CM | POA: Diagnosis not present

## 2021-06-03 ENCOUNTER — Encounter: Payer: PPO | Admitting: Pulmonary Disease

## 2021-06-06 ENCOUNTER — Telehealth: Payer: Self-pay | Admitting: Emergency Medicine

## 2021-06-06 NOTE — Telephone Encounter (Signed)
In review of records in [place], it does not appear that I have previously provided care for this  patient.  He is scheduled for a sleep study on 06/19/21 which I am listed as the reading provider.  Will defer to Dr. Lamonte Sakai to follow up on peer to peer review.

## 2021-06-06 NOTE — Telephone Encounter (Signed)
We received a letter from [employer] requesting a peer-to-peer review of this patient between Dr. Halford Chessman and Dr. Lamonte Sakai to determine if he is eligibility for on-going disability.  The insurance company has denied further coverage.  I've put a copy of the letter in Dr.Sood's and Dr. Agustina Caroli boxes up front.

## 2021-06-06 NOTE — Telephone Encounter (Signed)
Routing to Dr. Halford Chessman and Dr. Lamonte Sakai to make them aware.

## 2021-06-07 NOTE — Telephone Encounter (Signed)
I have the letter, called and left voice mail with [employer]. [employer] to set up Peer-to-Peer with their Dr Halford Chessman. They should be calling back to set it up.

## 2021-06-08 ENCOUNTER — Telehealth: Payer: Self-pay | Admitting: Emergency Medicine

## 2021-06-08 NOTE — Telephone Encounter (Signed)
Called Dr. Halford Chessman with the patient's insurance for peer to peer regarding patient's disability insurance as requested.  Left by message to call me back to review the case.

## 2021-06-14 NOTE — Telephone Encounter (Signed)
I spoke with Dr Halford Chessman to review the case. Answered questions regarding pt's workup, physiology and status.

## 2021-06-19 ENCOUNTER — Other Ambulatory Visit: Payer: Self-pay

## 2021-06-19 ENCOUNTER — Ambulatory Visit (HOSPITAL_BASED_OUTPATIENT_CLINIC_OR_DEPARTMENT_OTHER): Payer: PPO | Attending: Emergency Medicine | Admitting: Pulmonary Disease

## 2021-06-19 VITALS — Ht 70.0 in | Wt 190.0 lb

## 2021-06-19 DIAGNOSIS — G4710 Hypersomnia, unspecified: Secondary | ICD-10-CM | POA: Insufficient documentation

## 2021-06-19 DIAGNOSIS — R5383 Other fatigue: Secondary | ICD-10-CM | POA: Diagnosis not present

## 2021-06-19 DIAGNOSIS — I493 Ventricular premature depolarization: Secondary | ICD-10-CM | POA: Insufficient documentation

## 2021-06-19 DIAGNOSIS — G4733 Obstructive sleep apnea (adult) (pediatric): Secondary | ICD-10-CM | POA: Diagnosis present

## 2021-06-19 DIAGNOSIS — R0683 Snoring: Secondary | ICD-10-CM | POA: Insufficient documentation

## 2021-06-19 DIAGNOSIS — R519 Headache, unspecified: Secondary | ICD-10-CM | POA: Insufficient documentation

## 2021-06-22 DIAGNOSIS — G4733 Obstructive sleep apnea (adult) (pediatric): Secondary | ICD-10-CM

## 2021-06-22 NOTE — Procedures (Signed)
° ° °  Patient Name: Peter Mahoney, Peter Mahoney Date: 06/19/2021 Gender: Male D.O.B: 1960-12-26 Age (years): 56 Referring Provider: Baltazar Apo Height (inches): 19 Interpreting Physician: Chesley Mires MD, ABSM Weight (lbs): 190 RPSGT: Laren Everts BMI: 27 MRN: [id number] Neck Size: 16.00  CLINICAL INFORMATION Sleep Study Type: NPSG  Indication for sleep study: Excessive Daytime Sleepiness, Fatigue, Morning Headaches, Snoring  Epworth Sleepiness Score: 4  Most recent polysomnogram dated 12/31/2015 revealed an AHI of 0/h and RDI of 0/h.  SLEEP STUDY TECHNIQUE As per the AASM Manual for the Scoring of Sleep and Associated Events v2.3 (April 2016) with a hypopnea requiring 4% desaturations.  The channels recorded and monitored were frontal, central and occipital EEG, electrooculogram (EOG), submentalis EMG (chin), nasal and oral airflow, thoracic and abdominal wall motion, anterior tibialis EMG, snore microphone, electrocardiogram, and pulse oximetry.  MEDICATIONS Medications self-administered by patient taken the night of the study : DIAZEPAM, TAMSULOSIN HYDROCHLORIDE  SLEEP ARCHITECTURE The study was initiated at 10:42:29 PM and ended at 5:25:03 AM.  Sleep onset time was 35.1 minutes and the sleep efficiency was 77.9%%. The total sleep time was 313.5 minutes.  Stage REM latency was 82.5 minutes.  The patient spent 9.6%% of the night in stage N1 sleep, 71.6%% in stage N2 sleep, 0.0%% in stage N3 and 18.8% in REM.  Alpha intrusion was absent.  Supine sleep was 0.00%.  RESPIRATORY PARAMETERS The overall apnea/hypopnea index (AHI) was 0.0 per hour. There were 0 total apneas, including 0 obstructive, 0 central and 0 mixed apneas. There were 0 hypopneas and 6 RERAs.  The AHI during Stage REM sleep was 0.0 per hour.  AHI while supine was N/A per hour.  The mean oxygen saturation was 91.6%. The minimum SpO2 during sleep was 88.0%.  soft snoring was noted during this  study.  CARDIAC DATA The 2 lead EKG demonstrated sinus rhythm. The mean heart rate was 63.4 beats per minute. Other EKG findings include: PVCs.  LEG MOVEMENT DATA The total PLMS were 0 with a resulting PLMS index of 0.0. Associated arousal with leg movement index was 0.0 .  IMPRESSIONS - No significant obstructive sleep apnea occurred during this study (AHI = 0.0/h). - The patient had minimal or no oxygen desaturation during the study (Min O2 = 88.0%) - The patient snored with soft snoring volume. - EKG findings include PVCs. - Clinically significant periodic limb movements did not occur during sleep. No significant associated arousals.  DIAGNOSIS - Snoring.  RECOMMENDATIONS - Avoid alcohol, sedatives and other CNS depressants that may worsen sleep apnea and disrupt normal sleep architecture. - Sleep hygiene should be reviewed to assess factors that may improve sleep quality. - Weight management and regular exercise should be initiated or continued if appropriate.  [Electronically signed] 06/22/2021 01:23 PM  Chesley Mires MD, ABSM Diplomate, American Board of Sleep Medicine NPI: [id number]  [place] PH: [phone]   FX: [phone] [place]

## 2021-06-26 ENCOUNTER — Other Ambulatory Visit: Payer: Self-pay

## 2021-06-26 ENCOUNTER — Ambulatory Visit (INDEPENDENT_AMBULATORY_CARE_PROVIDER_SITE_OTHER): Payer: PPO | Admitting: Emergency Medicine

## 2021-06-26 ENCOUNTER — Encounter: Payer: Self-pay | Admitting: Emergency Medicine

## 2021-06-26 DIAGNOSIS — G4733 Obstructive sleep apnea (adult) (pediatric): Secondary | ICD-10-CM | POA: Diagnosis not present

## 2021-06-26 DIAGNOSIS — J431 Panlobular emphysema: Secondary | ICD-10-CM | POA: Diagnosis not present

## 2021-06-26 DIAGNOSIS — F17210 Nicotine dependence, cigarettes, uncomplicated: Secondary | ICD-10-CM | POA: Diagnosis not present

## 2021-06-26 NOTE — Assessment & Plan Note (Signed)
Dyspnea decreased functional capacity out of proportion to his pulmonary function testing, obstruction.  He has not benefited from BD in the past, uses albuterol occasionally.  I have asked him to try to improve his functional capacity by increasing his exercise.  He has done pulmonary rehab in the past 2021.  I discussed his long-term disability with Dr. Halford Chessman from [facility], noted that his dyspnea and decreased functional capacity around proportion to his testing, that he does not desaturate on exertion.  I believe that they are probably going to recommend that he can do sedentary work.  If there would be any benefit to repeat PFT, 6-minute walk, other testing to help his case that I am happy to do so.  He will let me know. ? ? ?Keep albuterol available to use 2 puffs when needed for shortness of breath, chest tightness, wheezing. ?Work on your exercise and conditioning as you are able to do so. ?Flu and COVID-19 vaccines up-to-date ?We reviewed your sleep study today.  No evidence of sleep apnea or low oxygen levels.  Good news. ?

## 2021-06-26 NOTE — Assessment & Plan Note (Signed)
PSG reviewed.  No evidence of OSA or hypoxemia.  Discussed with him today ?

## 2021-06-26 NOTE — Patient Instructions (Signed)
Keep albuterol available to use 2 puffs when needed for shortness of breath, chest tightness, wheezing. ?Work on your exercise and conditioning as you are able to do so. ?Flu and COVID-19 vaccines up-to-date ?We reviewed your sleep study today.  No evidence of sleep apnea or low oxygen levels.  Good news. ?Get your lung cancer screening CT scan in June as planned ?Follow Dr. Lamonte Sakai in July to review your lung cancer screening CT.  Follow sooner if you have any problems ?

## 2021-06-26 NOTE — Progress Notes (Signed)
? ?Subjective:  ? ? Patient ID: Peter Mahoney, male    DOB: 20-Jul-1960, 61 y.o.   MRN: [id number] ? ?HPI ? ?ROV 04/27/21 --61 year old gentleman with a history of significant emphysema but mild associated obstruction.  He has not really responded significantly to Spiriva.  Little effect from albuterol.  He does not desaturate with ambulation.  He participates in lung cancer screening program, neck scan planned for June/2023.  ?He has been dealing with plantar fasciitis.  ?He has SOB w exertion, sputum production daily - thick and clear. His exertional tolerance is still limited. He snores, no witnesses apneas. He does have daytime sleepiness. Uses diazepam to initiate sleep. PSG 01/10/2016 >> AHI 0.2/h, SpO2 low was 89%.  ?Still uses albuterol prn, but makes little difference. Finished pulm rehab 2021, no desats there.  ? ? ?ROV 06/26/21 --follow-up visit for 61 year old man with emphysematous change on imaging, mild associated obstructive lung disease.  Since I last saw him I was contacted by Dr. Sharmon Leyden with occupational medicine from continue him regarding the patient's disability. Uses albuterol rarely, once a week. Clear mucous production daily. He can exert but always associated w dyspnea. Completed pulm rehab in 2021, helped his strength but continued to have SOB with same activities.  ?Flu and covid vaccine up to date.  ?PSG done 2/28 >> AHI 0, no desats, no limb movements.  ? ? ?Review of Systems ?As above ? ?Past Medical History:  ?Diagnosis Date  ? Dental crown present   ? GERD (gastroesophageal reflux disease)   ? Post-operative infection 09/21/2011  ? left leg  ? PTSD (post-traumatic stress disorder)   ? Seasonal allergies   ? Wound infection after surgery, left lateral leg 10/08/2011  ?  ? ?Family History  ?Problem Relation Age of Onset  ? Heart Problems Mother   ? Stroke Mother   ? Pulmonary fibrosis Mother   ? Cancer - Lung Father   ?     smoked  ? Heart failure Maternal Grandmother   ? Emphysema  Maternal Grandfather   ?     "his whole family had Emphysema"  ? Diabetes Paternal Grandmother   ? Cancer Paternal Grandfather   ?  ? ?Social History  ? ?Socioeconomic History  ? Marital status: Married  ?  Spouse name: [name]  ? Number of children: 2  ? Years of education: 9  ? Highest education level: Not on file  ?Occupational History  ?  Employer: [employer]  ?  Comment: [employer]  ?Tobacco Use  ? Smoking status: Former  ?  Packs/day: 1.00  ?  Years: 35.00  ?  Pack years: 35.00  ?  Types: Cigarettes  ?  Start date: 68  ?  Quit date: 11/01/2015  ?  Years since quitting: 5.6  ? Smokeless tobacco: Never  ?Vaping Use  ? Vaping Use: Never used  ?Substance and Sexual Activity  ? Alcohol use: Yes  ?  Alcohol/week: 2.0 standard drinks  ?  Types: 2 Standard drinks or equivalent per week  ?  Comment: occasionally  ? Drug use: No  ? Sexual activity: Not on file  ?Other Topics Concern  ? Not on file  ?Social History Narrative  ? Married father of 2, Grandfather of 3. Wife's name is [name].  ? 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  ? Currently smokes a pack a day, and drinks maybe 2-3 alcohol  beverages a week.  ?   ?   ? Epworth Sleepiness Scale  ?   ? Score: 9  ?   ? --I seem to be losing my sex drive  ? --I feel stressed and lack motivation  ? --I have COPD  ? --I have been told that I snore  ? --I am overweight or am gaining weight  ? --I awake feeling not rested  ? ?Social Determinants of Health  ? 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 ?Allergies  ?Allergen Reactions  ? Dilaudid [Hydromorphone Hcl] Other (See Comments)  ?  Urinary retention- says with most opioids taken  ? Doxycycline Nausea And Vomiting  ? Oxycodone    ?  ? ?Outpatient Medications Prior to Visit  ?Medication Sig Dispense Refill  ? albuterol (VENTOLIN HFA) 108 (90 Base) MCG/ACT inhaler Inhale 2 puffs into the lungs every 6 (six) hours as needed for wheezing or shortness of breath. 18 g 5  ? diazepam (VALIUM) 10 MG tablet 10 mg at bedtime.     ? mupirocin ointment (BACTROBAN) 2 % Apply 1 application topically 2 (two) times daily. 22 g 0  ? RABEprazole (ACIPHEX) 20 MG tablet Take 20 mg by mouth in the morning and at bedtime.     ? sildenafil (VIAGRA) 100 MG tablet Take 1 tablet (100 mg total) by mouth as needed for erectile dysfunction. 30 tablet 6  ? tamsulosin (FLOMAX) 0.4 MG CAPS capsule Take 1 capsule (0.4 mg total) by mouth daily after supper. 90 capsule 3  ? ?No facility-administered medications prior to visit.  ? ? ? ? ?   ?Objective:  ? Physical Exam ?Vitals:  ? 06/26/21 1117  ?BP: 118/72  ?Pulse: 62  ?Temp: 97.6 ?F (36.4 ?C)  ?TempSrc: Oral  ?SpO2: 98%  ?Weight: 193 lb 3.2 oz (87.6 kg)  ?Height: '5\' 10"'$  (1.778 m)  ? ? ?Gen: Pleasant, well-nourished, in no distress,  normal affect ? ?ENT: No lesions,  mouth clear,  oropharynx clear, no postnasal drip ? ?Neck: No JVD, no stridor ? ?Lungs: No use of accessory muscles, distant, no crackles or wheezing on normal respiration, no wheeze on forced expiration ? ?Cardiovascular: RRR, heart sounds normal, no murmur or gallops, no peripheral edem ? ?Musculoskeletal: No deformities, no cyanosis or clubbing ? ?Neuro: alert, awake, non focal ? ?Skin: Warm, no lesions or rash ? ? ? ?   ?Assessment & Plan:  ?Cigarette smoker ?Get your lung cancer screening CT scan in June as planned ?Follow Dr. Lamonte Sakai in July to review your lung cancer screening CT.  Follow sooner if you have any problems ? ?COPD with emphysema ([place]) ?Dyspnea decreased functional capacity out of proportion to his pulmonary function testing, obstruction.  He has not benefited from BD in the past, uses albuterol occasionally.  I have asked him to try to improve  his functional capacity by increasing his exercise.  He has done pulmonary rehab in the past 2021.  I discussed his long-term disability with Dr. Halford Chessman from [facility], noted that his dyspnea and decreased functional capacity around proportion to his testing, that he does not desaturate on exertion.  I believe that they are probably going to recommend that he can do sedentary work.  If there would be any benefit to repeat PFT, 6-minute walk, other testing to help his case that I am happy to do so.  He will let me know. ? ? ?Keep albuterol available to use 2 puffs when needed for shortness of breath, chest tightness, wheezing. ?Work on your exercise and conditioning as you are able to do so. ?Flu and COVID-19 vaccines up-to-date ?We reviewed your sleep study today.  No evidence of sleep apnea or low oxygen levels.  Good news. ? ?Sleep apnea ?PSG reviewed.  No evidence of OSA or hypoxemia.  Discussed with him today ? ? ? ? ?Baltazar Apo, MD, PhD ?06/26/2021, 11:39 AM ?[facility] ?[phone] or if no answer [phone] ? ?

## 2021-06-26 NOTE — Assessment & Plan Note (Signed)
Get your lung cancer screening CT scan in June as planned ?Follow Dr. Lamonte Sakai in July to review your lung cancer screening CT.  Follow sooner if you have any problems ?

## 2021-07-26 DIAGNOSIS — L578 Other skin changes due to chronic exposure to nonionizing radiation: Secondary | ICD-10-CM | POA: Diagnosis not present

## 2021-07-26 DIAGNOSIS — B353 Tinea pedis: Secondary | ICD-10-CM | POA: Diagnosis not present

## 2021-07-26 DIAGNOSIS — L309 Dermatitis, unspecified: Secondary | ICD-10-CM | POA: Diagnosis not present

## 2021-07-26 DIAGNOSIS — L821 Other seborrheic keratosis: Secondary | ICD-10-CM | POA: Diagnosis not present

## 2021-07-26 DIAGNOSIS — R234 Changes in skin texture: Secondary | ICD-10-CM | POA: Diagnosis not present

## 2021-07-26 DIAGNOSIS — L219 Seborrheic dermatitis, unspecified: Secondary | ICD-10-CM | POA: Diagnosis not present

## 2021-07-26 DIAGNOSIS — D225 Melanocytic nevi of trunk: Secondary | ICD-10-CM | POA: Diagnosis not present

## 2021-07-26 DIAGNOSIS — D2272 Melanocytic nevi of left lower limb, including hip: Secondary | ICD-10-CM | POA: Diagnosis not present

## 2021-07-26 DIAGNOSIS — D2261 Melanocytic nevi of right upper limb, including shoulder: Secondary | ICD-10-CM | POA: Diagnosis not present

## 2021-07-26 DIAGNOSIS — Z808 Family history of malignant neoplasm of other organs or systems: Secondary | ICD-10-CM | POA: Diagnosis not present

## 2021-07-26 DIAGNOSIS — L853 Xerosis cutis: Secondary | ICD-10-CM | POA: Diagnosis not present

## 2021-07-26 DIAGNOSIS — D485 Neoplasm of uncertain behavior of skin: Secondary | ICD-10-CM | POA: Diagnosis not present

## 2021-07-26 DIAGNOSIS — L918 Other hypertrophic disorders of the skin: Secondary | ICD-10-CM | POA: Diagnosis not present

## 2021-10-08 ENCOUNTER — Ambulatory Visit (HOSPITAL_COMMUNITY): Payer: PPO

## 2021-10-11 ENCOUNTER — Ambulatory Visit (HOSPITAL_COMMUNITY)
Admission: RE | Admit: 2021-10-11 | Discharge: 2021-10-11 | Disposition: A | Discharge status: Home / Self Care | Payer: PPO | Source: Ambulatory Visit | Attending: Acute Care | Admitting: Acute Care

## 2021-10-11 DIAGNOSIS — Z87891 Personal history of nicotine dependence: Secondary | ICD-10-CM

## 2021-10-15 ENCOUNTER — Other Ambulatory Visit: Payer: Self-pay | Admitting: Neurology

## 2021-10-15 ENCOUNTER — Other Ambulatory Visit: Payer: Self-pay | Admitting: Acute Care

## 2021-10-15 DIAGNOSIS — Z87891 Personal history of nicotine dependence: Secondary | ICD-10-CM

## 2021-10-15 DIAGNOSIS — Z122 Encounter for screening for malignant neoplasm of respiratory organs: Secondary | ICD-10-CM

## 2021-10-31 ENCOUNTER — Encounter: Payer: Self-pay | Admitting: Emergency Medicine

## 2021-10-31 ENCOUNTER — Ambulatory Visit: Payer: PPO | Admitting: Emergency Medicine

## 2021-10-31 VITALS — BP 122/86 | HR 59 | Temp 98.0°F | Ht 70.0 in | Wt 185.6 lb

## 2021-10-31 DIAGNOSIS — R911 Solitary pulmonary nodule: Secondary | ICD-10-CM | POA: Diagnosis not present

## 2021-10-31 DIAGNOSIS — J431 Panlobular emphysema: Secondary | ICD-10-CM

## 2021-10-31 DIAGNOSIS — G4733 Obstructive sleep apnea (adult) (pediatric): Secondary | ICD-10-CM

## 2021-10-31 NOTE — Assessment & Plan Note (Signed)
No evidence of OSA on his PSG done 06/19/2021.

## 2021-10-31 NOTE — Assessment & Plan Note (Signed)
Stable pulmonary nodular disease noted on his lung cancer screening CT chest 10/11/2021.  He will have a repeat LDCT in June 2024.

## 2021-10-31 NOTE — Addendum Note (Signed)
Addended by: Irine Seal B on: 10/31/2021 04:26 PM   Modules accepted: Orders

## 2021-10-31 NOTE — Patient Instructions (Addendum)
Please continue to keep your albuterol available to use 2 puffs if you needed for shortness of breath, chest tightness, wheezing. We reviewed your lung cancer screening CT scan of the chest.  This is a stable scan.  Good news.  You will need a repeat scan in June 2024. Follow Dr. Lamonte Sakai in 12 months, sooner if you have any new symptoms or problems.

## 2021-10-31 NOTE — Progress Notes (Signed)
Subjective:    Patient ID: Peter Mahoney, male    DOB: 1960/07/22, 61 y.o.   MRN: [id number]  HPI  ROV 04/27/21 --61 year old gentleman with a history of significant emphysema but mild associated obstruction.  He has not really responded significantly to Spiriva.  Little effect from albuterol.  He does not desaturate with ambulation.  He participates in lung cancer screening program, neck scan planned for June/2023.  He has been dealing with plantar fasciitis.  He has SOB w exertion, sputum production daily - thick and clear. His exertional tolerance is still limited. He snores, no witnesses apneas. He does have daytime sleepiness. Uses diazepam to initiate sleep. PSG 01/10/2016 >> AHI 0.2/h, SpO2 low was 89%.  Still uses albuterol prn, but makes little difference. Finished pulm rehab 2021, no desats there.    ROV 06/26/21 --follow-up visit for 61 year old man with emphysematous change on imaging, mild associated obstructive lung disease.  Since I last saw him I was contacted by Dr. Sharmon Leyden with occupational medicine from continue him regarding the patient's disability. Uses albuterol rarely, once a week. Clear mucous production daily. He can exert but always associated w dyspnea. Completed pulm rehab in 2021, helped his strength but continued to have SOB with same activities.  Flu and covid vaccine up to date.  PSG done 2/28 >> AHI 0, no desats, no limb movements.    ROV 10/31/21 --61 year old man with a history of mild obstructive lung disease and some emphysematous change on chest imaging.  He has had persistent dyspnea and overall functional limitation that is out of proportion to his pulmonary function testing and airflow limitation.  He has not benefited from BD in the past.  He does not desaturate on exertion.  I discussed long-term disability and decreased functional capacity with his physician from [facility], Dr. Halford Chessman.   Using albuterol rarely Has SOB at rest or w any exertion.  He has  cough daily - clear mucous.  His disability appeal was denied. He is in litigation about this.   His lung cancer screening CT chest was done 10/11/2021 and I have reviewed, showed moderate centrilobular emphysema, stable solid pulmonary nodules largest 9 mm in the right middle lobe.  RADS 2 study, plan for repeat in 12 months.   Review of Systems As above  Past Medical History:  Diagnosis Date   Dental crown present    GERD (gastroesophageal reflux disease)    Post-operative infection 09/21/2011   left leg   PTSD (post-traumatic stress disorder)    Seasonal allergies    Wound infection after surgery, left lateral leg 10/08/2011     Family History  Problem Relation Age of Onset   Heart Problems Mother    Stroke Mother    Pulmonary fibrosis Mother    Cancer - Lung Father        smoked   Heart failure Maternal Grandmother    Emphysema Maternal Grandfather        "his whole family had Emphysema"   Diabetes Paternal Grandmother    Cancer Paternal Grandfather      Social History   Socioeconomic History   Marital status: Married    Spouse name: [name]   Number of children: 2   Years of education: 12   Highest education level: Not on file  Occupational History    Employer: [employer]    Comment: [employer]  Tobacco Use   Smoking status: Former    Packs/day: 1.00  Years: 35.00    Total pack years: 35.00    Types: Cigarettes    Start date: 70    Quit date: 11/01/2015    Years since quitting: 6.0    Passive exposure: Past   Smokeless tobacco: Never  Vaping Use   Vaping Use: Never used  Substance and Sexual Activity   Alcohol use: Yes    Alcohol/week: 2.0 standard drinks of alcohol    Types: 2 Standard drinks or equivalent per week    Comment: occasionally   Drug use: No   Sexual activity: Not on file  Other Topics Concern   Not on file  Social History Narrative   Married father of 2, Grandfather of 3. Wife's name is [name].   He works as a Animal nutritionist  for [employer] 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   Currently smokes a pack a day, and drinks maybe 2-3 alcohol beverages a week.         Epworth Sleepiness Scale      Score: 9      --I seem to be losing my sex drive   --I feel stressed and lack motivation   --I have COPD   --I have been told that I snore   --I am overweight or am gaining weight   --I awake feeling not rested   Social Determinants of Health   Financial Resource Strain: Not on file  Food Insecurity: Not on file  Transportation Needs: Not on file  Physical Activity: Not on file  Stress: Not on file  Social Connections: Not on file  Intimate Partner Violence: Not on file  [employer]  Cigarette company   Allergies  Allergen Reactions   Dilaudid [Hydromorphone Hcl] Other (See Comments)    Urinary retention- says with most opioids taken   Doxycycline Nausea And Vomiting   Oxycodone      Outpatient Medications Prior to Visit  Medication Sig Dispense Refill   albuterol (VENTOLIN HFA) 108 (90 Base) MCG/ACT inhaler Inhale 2 puffs into the lungs every 6 (six) hours as needed for wheezing or shortness of breath. 18 g 5   diazepam (VALIUM) 10 MG tablet 10 mg at bedtime.      mupirocin ointment (BACTROBAN) 2 % Apply 1 application topically 2 (two) times daily. 22 g 0   pantoprazole (PROTONIX) 20 MG tablet Take 20 mg by mouth once.     RABEprazole (ACIPHEX) 20 MG tablet Take 20 mg by mouth in the morning and at bedtime.      sildenafil (VIAGRA) 100 MG tablet Take 1 tablet (100 mg total) by mouth as needed for erectile dysfunction. 30 tablet 6   tamsulosin (FLOMAX) 0.4 MG CAPS capsule Take 1 capsule (0.4 mg total) by mouth daily after supper. 90 capsule 3   No facility-administered medications prior to visit.         Objective:   Physical Exam Vitals:   10/31/21 1535  BP: 122/86  Pulse: (!) 59  Temp: 98 F (36.7 C)  TempSrc: Oral  SpO2: 99%  Weight: 185 lb 9.6 oz (84.2 kg)   Height: '5\' 10"'$  (1.778 m)    Gen: Pleasant, well-nourished, in no distress,  normal affect  ENT: No lesions,  mouth clear,  oropharynx clear, no postnasal drip  Neck: No JVD, no stridor  Lungs: No use of accessory muscles, distant, no crackles or wheezing on normal respiration, no wheeze on forced expiration  Cardiovascular: RRR, heart sounds normal,  no murmur or gallops, no peripheral edem  Musculoskeletal: No deformities, no cyanosis or clubbing  Neuro: alert, awake, non focal  Skin: Warm, no lesions or rash       Assessment & Plan:  COPD with emphysema ([place]) He has dyspnea at rest and with exertion, out of proportion to his previous pulmonary function testing.  He is never benefited significantly from scheduled BD (has tried several).  For now we will continue to use albuterol as needed, consider retry scheduled therapy if his dyspnea progresses.  Sleep apnea No evidence of OSA on his PSG done 06/19/2021.  Pulmonary nodule Stable pulmonary nodular disease noted on his lung cancer screening CT chest 10/11/2021.  He will have a repeat LDCT in June 2024.     Baltazar Apo, MD, PhD 10/31/2021, 4:10 PM [facility] [phone] or if no answer [phone]

## 2021-10-31 NOTE — Assessment & Plan Note (Signed)
He has dyspnea at rest and with exertion, out of proportion to his previous pulmonary function testing.  He is never benefited significantly from scheduled BD (has tried several).  For now we will continue to use albuterol as needed, consider retry scheduled therapy if his dyspnea progresses.

## 2021-11-30 DIAGNOSIS — U071 COVID-19: Secondary | ICD-10-CM | POA: Diagnosis not present

## 2021-12-07 ENCOUNTER — Telehealth: Payer: Self-pay | Admitting: Emergency Medicine

## 2021-12-07 NOTE — Telephone Encounter (Signed)
Peter Mahoney rescheduled this appt

## 2022-01-28 ENCOUNTER — Ambulatory Visit: Payer: Self-pay | Admitting: Urology

## 2022-01-29 ENCOUNTER — Encounter: Payer: Self-pay | Admitting: Urology

## 2022-01-29 ENCOUNTER — Ambulatory Visit (INDEPENDENT_AMBULATORY_CARE_PROVIDER_SITE_OTHER): Payer: PPO | Admitting: Urology

## 2022-01-29 VITALS — BP 123/75 | HR 58

## 2022-01-29 DIAGNOSIS — N5201 Erectile dysfunction due to arterial insufficiency: Secondary | ICD-10-CM

## 2022-01-29 DIAGNOSIS — N138 Other obstructive and reflux uropathy: Secondary | ICD-10-CM

## 2022-01-29 DIAGNOSIS — R3912 Poor urinary stream: Secondary | ICD-10-CM

## 2022-01-29 DIAGNOSIS — N401 Enlarged prostate with lower urinary tract symptoms: Secondary | ICD-10-CM

## 2022-01-29 LAB — URINALYSIS, ROUTINE W REFLEX MICROSCOPIC
Bilirubin, UA: NEGATIVE
Glucose, UA: NEGATIVE
Ketones, UA: NEGATIVE
Leukocytes,UA: NEGATIVE
Nitrite, UA: NEGATIVE
Protein,UA: NEGATIVE
RBC, UA: NEGATIVE
Specific Gravity, UA: 1.01 (ref 1.005–1.030)
Urobilinogen, Ur: 0.2 mg/dL (ref 0.2–1.0)
pH, UA: 6.5 (ref 5.0–7.5)

## 2022-01-29 LAB — BLADDER SCAN AMB NON-IMAGING: Scan Result: 104

## 2022-01-29 MED ORDER — SILDENAFIL CITRATE 100 MG PO TABS: 100.0000 mg | ORAL_TABLET | ORAL | 6 refills | Status: DC | PRN

## 2022-01-29 MED ORDER — TAMSULOSIN HCL 0.4 MG PO CAPS: 0.4000 mg | ORAL_CAPSULE | Freq: Two times a day (BID) | ORAL | 3 refills | Status: DC

## 2022-01-29 NOTE — Patient Instructions (Signed)

## 2022-01-29 NOTE — Progress Notes (Signed)
post void residual=104

## 2022-01-29 NOTE — Progress Notes (Signed)
01/29/2022 10:24 AM   Peter Mahoney 23-Apr-1960 [id number]  Referring provider: [name], MD [address] [place],  [place] [address]  No chief complaint on file.   HPI: IPSS 10 QOL 3. He has a weaker stream in the morning. Nocturia 1x. PVR 104cc. Urine stream intermittently weak. He takes sildenafil '150mg'$  prn with good results. He has tried tadalafil and trimix in the past with mixed results.    PMH: Past Medical History:  Diagnosis Date   Dental crown present    GERD (gastroesophageal reflux disease)    Post-operative infection 09/21/2011   left leg   PTSD (post-traumatic stress disorder)    Seasonal allergies    Wound infection after surgery, left lateral leg 10/08/2011    Surgical History: Past Surgical History:  Procedure Laterality Date   BACK SURGERY  04/1995   L5-S1   COLONOSCOPY N/A 05/12/2015   Procedure: COLONOSCOPY;  Surgeon: Aviva Signs, MD;  [facility];  Service: Gastroenterology;  Laterality: N/A;   INCISION AND DRAINAGE OF WOUND  10/08/2011   Procedure: IRRIGATION AND DEBRIDEMENT WOUND;  Surgeon: Johnny Bridge, MD;  Location: [facility];  Service: Orthopedics;  Laterality: Left;  left left post op wound   KNEE ARTHROSCOPY  11/2010   left knee - meniscus tear   KNEE SURGERY  08/26/2011   hamstring tendon tear left   NM MYOVIEW LTD  11/17/2012   Exercise 7 minutes. EF 60%. No ischemia or infarction.; Low risk   TONSILLECTOMY     as a child   TRANSTHORACIC ECHOCARDIOGRAM  01/2016   Normal LV function EF 5560%. No regional wall motion abnormality. 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 Mild LA dilation. Otherwise normal    Home Medications:  Allergies as of 01/29/2022       Reactions   Dilaudid [hydromorphone Hcl] Other (See Comments)   Urinary retention- says with most opioids taken   Doxycycline Nausea And Vomiting   Oxycodone         Medication List         Accurate as of January 29, 2022 10:24 AM. If you have any questions, ask your nurse or doctor.          albuterol 108 (90 Base) MCG/ACT inhaler Commonly known as: VENTOLIN HFA Inhale 2 puffs into the lungs every 6 (six) hours as needed for wheezing or shortness of breath.   diazepam 10 MG tablet Commonly known as: VALIUM 10 mg at bedtime.   mupirocin ointment 2 % Commonly known as: BACTROBAN Apply 1 application topically 2 (two) times daily.   pantoprazole 20 MG tablet Commonly known as: PROTONIX Take 20 mg by mouth once.   RABEprazole 20 MG tablet Commonly known as: ACIPHEX Take 20 mg by mouth in the morning and at bedtime.   rizatriptan 10 MG tablet Commonly known as: MAXALT   sildenafil 100 MG tablet Commonly known as: Viagra Take 1 tablet (100 mg total) by mouth as needed for erectile dysfunction.   tamsulosin 0.4 MG Caps capsule Commonly known as: FLOMAX Take 1 capsule (0.4 mg total) by mouth daily after supper.   topiramate 25 MG tablet Commonly known as: TOPAMAX TAKE ONE TABLET BY MOUTH ONCE A DAY FOR 1 WEEK, THEN TAKE TWO TABLETS EVERY EVENING THEREAFTER        Allergies:  Allergies  Allergen Reactions   Dilaudid [Hydromorphone Hcl] Other (See Comments)    Urinary retention-  says with most opioids taken   Doxycycline Nausea And Vomiting   Oxycodone     Family History: Family History  Problem Relation Age of Onset   Heart Problems Mother    Stroke Mother    Pulmonary fibrosis Mother    Cancer - Lung Father        smoked   Heart failure Maternal Grandmother    Emphysema Maternal Grandfather        "his whole family had Emphysema"   Diabetes Paternal Grandmother    Cancer Paternal Grandfather     Social History:  reports that he quit smoking about 6 years ago. His smoking use included cigarettes. He started smoking about 42 years ago. He has a 35.00 pack-year smoking history. He has been exposed to tobacco smoke. He has never used  smokeless tobacco. He reports current alcohol use of about 2.0 standard drinks of alcohol per week. He reports that he does not use drugs.  ROS: All other review of systems were reviewed and are negative except what is noted above in HPI  Physical Exam: BP 123/75   Pulse (!) 58   Constitutional:  Alert and oriented, No acute distress. HEENT: [place] AT, moist mucus membranes.  Trachea midline, no masses. Cardiovascular: No clubbing, cyanosis, or edema. Respiratory: Normal respiratory effort, no increased work of breathing. GI: Abdomen is soft, nontender, nondistended, no abdominal masses GU: No CVA tenderness.  Lymph: No cervical or inguinal lymphadenopathy. Skin: No rashes, bruises or suspicious lesions. Neurologic: Grossly intact, no focal deficits, moving all 4 extremities. Psychiatric: Normal mood and affect.  Laboratory Data: Lab Results  Component Value Date   HGB 15.0 10/08/2011    Lab Results  Component Value Date   CREATININE 1.04 01/14/2019    No results found for: "PSA"  No results found for: "TESTOSTERONE"  No results found for: "HGBA1C"  Urinalysis    Component Value Date/Time   APPEARANCEUR Clear 02/02/2021 1146   GLUCOSEU Negative 02/02/2021 1146   BILIRUBINUR Negative 02/02/2021 1146   PROTEINUR Negative 02/02/2021 1146   NITRITE Negative 02/02/2021 1146   LEUKOCYTESUR Negative 02/02/2021 1146    Lab Results  Component Value Date   LABMICR Comment 02/02/2021    Pertinent Imaging:  No results found for this or any previous visit.  No results found for this or any previous visit.  No results found for this or any previous visit.  No results found for this or any previous visit.  No results found for this or any previous visit.  No valid procedures specified. No results found for this or any previous visit.  No results found for this or any previous visit.   Assessment & Plan:    1. Benign prostatic hyperplasia with urinary  obstruction -Increase flomax to 0.'4mg'$  BID - BLADDER SCAN AMB NON-IMAGING - Urinalysis, Routine w reflex microscopic  2. Weak urinary stream -Increase flomax to 0.'4mg'$  BID  3. Erectile dysfunction due to arterial insufficiency -sildenafil '150mg'$  prn   No follow-ups on file.  Nicolette Bang, MD  [facility] [place]

## 2022-02-01 ENCOUNTER — Ambulatory Visit: Payer: Medicare Other | Admitting: Urology

## 2022-02-18 ENCOUNTER — Other Ambulatory Visit: Payer: Self-pay | Admitting: Urology

## 2022-03-07 ENCOUNTER — Ambulatory Visit: Payer: PPO | Admitting: Internal Medicine

## 2022-03-27 ENCOUNTER — Other Ambulatory Visit: Payer: Self-pay | Admitting: Urology

## 2022-03-29 ENCOUNTER — Ambulatory Visit: Payer: PPO | Attending: Internal Medicine | Admitting: Internal Medicine

## 2022-03-29 ENCOUNTER — Encounter: Payer: Self-pay | Admitting: Internal Medicine

## 2022-03-29 VITALS — BP 130/82 | HR 67 | Ht 70.0 in | Wt 185.0 lb

## 2022-03-29 DIAGNOSIS — I2510 Atherosclerotic heart disease of native coronary artery without angina pectoris: Secondary | ICD-10-CM

## 2022-03-29 NOTE — Patient Instructions (Signed)
Medication Instructions:  Your physician recommends that you continue on your current medications as directed. Please refer to the Current Medication list given to you today.   Labwork: None  Testing/Procedures: None  Follow-Up: Follow up with Dr. Mallipeddi as needed.   Any Other Special Instructions Will Be Listed Below (If Applicable).     If you need a refill on your cardiac medications before your next appointment, please call your pharmacy.  

## 2022-03-29 NOTE — Progress Notes (Signed)
Cardiology Office Note  Date: 03/29/2022   ID: Peter Mahoney, DOB 1961-03-06, MRN [id number]  PCP:  [name], MD  Cardiologist:  None Electrophysiologist:  None   Reason for Office Visit:    History of Present Illness: Peter Mahoney is a 61 y.o. male known to have coronary calcification by CT with subsequent CTA cardiac showed nonobstructive CAD in 2020 with calcium score of 10, GERD, COPD presents to the cardiology clinic for follow-up visit.  Patient had coronary calcifications on CT chest for which she underwent CT cardiac which showed nonobstructive CAD in 2020 (mixed plaque in left main and LAD, calcium score is 10.5 units).  Denied any angina, DOE, lightness, dizziness, syncope, palpitations, LE swelling.  Former smoker, quit smoking in 2017, denied alcohol use or illicit drug abuse.  He had an appointment with [facility] in the past who informed him that he does not have sleep apnea anymore.  He was also told in the past that he has resting slow heart rate in 50 to 60s and denied any symptom of dizziness/lightheadedness or syncope.  Past Medical History:  Diagnosis Date   Dental crown present    GERD (gastroesophageal reflux disease)    Post-operative infection 09/21/2011   left leg   PTSD (post-traumatic stress disorder)    Seasonal allergies    Wound infection after surgery, left lateral leg 10/08/2011    Past Surgical History:  Procedure Laterality Date   BACK SURGERY  04/1995   L5-S1   COLONOSCOPY N/A 05/12/2015   Procedure: COLONOSCOPY;  Surgeon: Aviva Signs, MD;  [facility];  Service: Gastroenterology;  Laterality: N/A;   INCISION AND DRAINAGE OF WOUND  10/08/2011   Procedure: IRRIGATION AND DEBRIDEMENT WOUND;  Surgeon: Johnny Bridge, MD;  Location: [facility];  Service: Orthopedics;  Laterality: Left;  left left post op wound   KNEE ARTHROSCOPY  11/2010   left knee - meniscus tear   KNEE SURGERY  08/26/2011    hamstring tendon tear left   NM MYOVIEW LTD  11/17/2012   Exercise 7 minutes. EF 60%. No ischemia or infarction.; Low risk   TONSILLECTOMY     as a child   TRANSTHORACIC ECHOCARDIOGRAM  01/2016   Normal LV function EF 5560%. No regional wall motion abnormality. 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 Mild LA dilation. Otherwise normal    Current Outpatient Medications  Medication Sig Dispense Refill   albuterol (VENTOLIN HFA) 108 (90 Base) MCG/ACT inhaler Inhale 2 puffs into the lungs every 6 (six) hours as needed for wheezing or shortness of breath. 18 g 5   diazepam (VALIUM) 10 MG tablet 10 mg at bedtime.      mupirocin ointment (BACTROBAN) 2 % Apply 1 application topically 2 (two) times daily. 22 g 0   pantoprazole (PROTONIX) 20 MG tablet Take 20 mg by mouth once.     RABEprazole (ACIPHEX) 20 MG tablet Take 20 mg by mouth in the morning and at bedtime.      rizatriptan (MAXALT) 10 MG tablet      sildenafil (VIAGRA) 100 MG tablet Take 1 tablet (100 mg total) by mouth as needed for erectile dysfunction. 30 tablet 6   tamsulosin (FLOMAX) 0.4 MG CAPS capsule TAKE 1 CAPSULE BY MOUTH EVERY DAY AFTER SUPPER 90 capsule 3   topiramate (TOPAMAX) 25 MG tablet TAKE ONE TABLET BY MOUTH ONCE A DAY FOR 1 WEEK, THEN TAKE  TWO TABLETS EVERY EVENING THEREAFTER     No current facility-administered medications for this visit.   Allergies:  Dilaudid [hydromorphone hcl], Doxycycline, and Oxycodone   Social History: The patient  reports that he quit smoking about 6 years ago. His smoking use included cigarettes. He started smoking about 42 years ago. He has a 35.00 pack-year smoking history. He has been exposed to tobacco smoke. He has never used smokeless tobacco. He reports current alcohol use of about 2.0 standard drinks of alcohol per week. He reports that he does not use drugs.   Family History: The patient's family history includes Cancer in his  paternal grandfather; Cancer - Lung in his father; Diabetes in his paternal grandmother; Emphysema in his maternal grandfather; Heart Problems in his mother; Heart failure in his maternal grandmother; Pulmonary fibrosis in his mother; Stroke in his mother.   ROS:  Please see the history of present illness. Otherwise, complete review of systems is positive for none.  All other systems are reviewed and negative.   Physical Exam: VS:  BP 130/82   Pulse 61   Ht '5\' 10"'$  (1.778 m)   Wt 185 lb (83.9 kg)   SpO2 95%   BMI 26.54 kg/m , BMI Body mass index is 26.54 kg/m.  Wt Readings from Last 3 Encounters:  03/29/22 185 lb (83.9 kg)  10/31/21 185 lb 9.6 oz (84.2 kg)  06/26/21 193 lb 3.2 oz (87.6 kg)    General: Patient appears comfortable at rest. HEENT: Conjunctiva and lids normal, oropharynx clear with moist mucosa. Neck: Supple, no elevated JVP or carotid bruits, no thyromegaly. Lungs: Clear to auscultation, nonlabored breathing at rest. Cardiac: Regular rate and rhythm, no S3 or significant systolic murmur, no pericardial rub. Abdomen: Soft, nontender, no hepatomegaly, bowel sounds present, no guarding or rebound. Extremities: No pitting edema, distal pulses 2+. Skin: Warm and dry. Musculoskeletal: No kyphosis. Neuropsychiatric: Alert and oriented x3, affect grossly appropriate.  ECG: Normal sinus rhythm and no ST-T changes  Recent Labwork: No results found for requested labs within last 365 days.  No results found for: "CHOL", "TRIG", "HDL", "CHOLHDL", "VLDL", "LDLCALC", "LDLDIRECT"  Other Studies Reviewed Today: Echo from 12/2018 LVEF preserved No valve abnormalities  CT cardiac in 2020 Coronary artery calcium score 10.5 units Mixed plaque in the proximal LAD Mixed plaque in the left main  Assessment and Plan: Patient is a 61 year old M known to have coronary calcification by CT with subsequent CTA cardiac showed nonobstructive CAD in 2020 with calcium score of 10,?   Bradycardia, GERD, COPD presents to the cardiology clinic for follow-up visit.  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 Can revisit calcium scoring in 5 or 10 years for risk stratification.  # OSA -Patient presented [facility] sleep medicine who informed him that he does not have OSA anymore.  I have spent a total of 23 minutes with patient reviewing chart, EKGs, labs and examining patient as well as establishing an assessment and plan that was discussed with the patient.  > 50% of time was spent in direct patient care.     Medication Adjustments/Labs and Tests Ordered: Current medicines are reviewed at length with the patient today.  Concerns regarding medicines are outlined above.   Tests Ordered: Orders Placed This Encounter  Procedures   EKG 12-Lead    Medication Changes: No orders of the defined types were placed in this encounter.   Disposition:  Follow up PRN  Signed, Bailen Geffre Priya Shernell Saldierna,  MD, 03/29/2022 12:52 PM    [facility] [place]. [address] [place], [place] [address]

## 2022-04-08 ENCOUNTER — Ambulatory Visit (INDEPENDENT_AMBULATORY_CARE_PROVIDER_SITE_OTHER): Payer: PPO | Admitting: Family Medicine

## 2022-04-08 ENCOUNTER — Encounter: Payer: Self-pay | Admitting: Family Medicine

## 2022-04-08 VITALS — BP 100/70 | HR 80 | Temp 98.0°F | Ht 70.87 in | Wt 184.2 lb

## 2022-04-08 DIAGNOSIS — K219 Gastro-esophageal reflux disease without esophagitis: Secondary | ICD-10-CM | POA: Diagnosis not present

## 2022-04-08 DIAGNOSIS — Z125 Encounter for screening for malignant neoplasm of prostate: Secondary | ICD-10-CM | POA: Diagnosis not present

## 2022-04-08 DIAGNOSIS — Z130 Encounter for screening for diseases of the blood and blood-forming organs and certain disorders involving the immune mechanism: Secondary | ICD-10-CM | POA: Diagnosis not present

## 2022-04-08 DIAGNOSIS — G43909 Migraine, unspecified, not intractable, without status migrainosus: Secondary | ICD-10-CM

## 2022-04-08 DIAGNOSIS — F4310 Post-traumatic stress disorder, unspecified: Secondary | ICD-10-CM | POA: Diagnosis not present

## 2022-04-08 DIAGNOSIS — I2510 Atherosclerotic heart disease of native coronary artery without angina pectoris: Secondary | ICD-10-CM

## 2022-04-08 DIAGNOSIS — J431 Panlobular emphysema: Secondary | ICD-10-CM | POA: Diagnosis not present

## 2022-04-08 DIAGNOSIS — G4700 Insomnia, unspecified: Secondary | ICD-10-CM | POA: Diagnosis not present

## 2022-04-08 DIAGNOSIS — R001 Bradycardia, unspecified: Secondary | ICD-10-CM

## 2022-04-08 DIAGNOSIS — R911 Solitary pulmonary nodule: Secondary | ICD-10-CM

## 2022-04-08 MED ORDER — TRAZODONE HCL 50 MG PO TABS: 50.0000 mg | ORAL_TABLET | Freq: Every day | ORAL | 0 refills | Status: DC

## 2022-04-08 MED ORDER — TRAZODONE HCL 50 MG PO TABS: 50.0000 mg | ORAL_TABLET | Freq: Every evening | ORAL | 0 refills | Status: DC | PRN

## 2022-04-08 MED ORDER — DIAZEPAM 10 MG PO TABS: 10.0000 mg | ORAL_TABLET | Freq: Every evening | ORAL | 0 refills | Status: DC | PRN

## 2022-04-08 NOTE — Assessment & Plan Note (Signed)
Nonobstructive.  Asymptomatic currently.

## 2022-04-08 NOTE — Assessment & Plan Note (Signed)
[facility] 

## 2022-04-08 NOTE — Patient Instructions (Signed)
Labs ordered.  Try the Trazodone.  Follow up in 6 months.  Take care  Dr. Lacinda Axon

## 2022-04-08 NOTE — Assessment & Plan Note (Signed)
Trial of trazodone. 

## 2022-04-08 NOTE — Assessment & Plan Note (Addendum)
Stable on Topamax and Maxalt.  Continue.

## 2022-04-08 NOTE — Progress Notes (Signed)
Subjective:  Patient ID: Peter Mahoney, male    DOB: May 29, 1960  Age: 61 y.o. MRN: [id number]  CC: Establish care  HPI Peter Mahoney is a 61 y.o. male presents to the clinic today to establish care.  Patient has nonobstructive coronary artery disease.  He has quit smoking.  He does not have hypertension.  His blood pressure is normal.  No labs available for the patient.  He gets some of his care at the [place] and has also had his care by a local primary care provider.  No records available from these individuals.  He is followed by pulmonology regarding pulmonary nodule and COPD.  Patient is on chronic Valium for insomnia.  Will discuss risks and benefits and other options.  He has tried Ambien in the past as well.  Patient has a positive depression screen and anxiety screen today.  He is followed at the [facility] regarding his mental health and PTSD.  Patient is in need of laboratory studies.  COPD stable.  GERD stable.  Patient is on treatment for migraines.   PMH, Surgical Hx, Family Hx, Social History reviewed.  Past medical history: Coronary artery disease, migraine, COPD, GERD, PTSD, insomnia, erectile dysfunction, BPH.  Past Surgical History:  Procedure Laterality Date   BACK SURGERY  04/1995   L5-S1   COLONOSCOPY N/A 05/12/2015   Procedure: COLONOSCOPY;  Surgeon: Aviva Signs, MD;  [facility];  Service: Gastroenterology;  Laterality: N/A;   INCISION AND DRAINAGE OF WOUND  10/08/2011   Procedure: IRRIGATION AND DEBRIDEMENT WOUND;  Surgeon: Johnny Bridge, MD;  Location: [place];  Service: Orthopedics;  Laterality: Left;  left left post op wound   KNEE ARTHROSCOPY  11/2010   left knee - meniscus tear   KNEE SURGERY  08/26/2011   hamstring tendon tear left   NM MYOVIEW LTD  11/17/2012   Exercise 7 minutes. EF 60%. No ischemia or infarction.; Low risk   TONSILLECTOMY     as a child   TRANSTHORACIC ECHOCARDIOGRAM  01/2016   Normal LV function EF 5560%. No  regional wall motion abnormality. 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 Mild LA dilation. Otherwise normal   Family History  Problem Relation Age of Onset   Heart Problems Mother    Stroke Mother    Pulmonary fibrosis Mother    Cancer - Lung Father        smoked   Heart failure Maternal Grandmother    Emphysema Maternal Grandfather        "his whole family had Emphysema"   Diabetes Paternal Grandmother    Cancer Paternal Grandfather    Social History   Tobacco Use   Smoking status: Former    Packs/day: 1.00    Years: 35.00    Total pack years: 35.00    Types: Cigarettes    Start date: 43    Quit date: 11/01/2015    Years since quitting: 6.4    Passive exposure: Past   Smokeless tobacco: Never  Substance Use Topics   Alcohol use: Yes    Alcohol/week: 2.0 standard drinks of alcohol    Types: 2 Standard drinks or equivalent per week    Comment: occasionally    Review of Systems  Constitutional: Negative.   Cardiovascular: Negative.    Objective:   Today's Vitals: BP 100/70   Pulse 80   Temp 98 F (36.7 C)   Ht 5' 10.87" (  1.8 m)   Wt 184 lb 3.2 oz (83.6 kg)   SpO2 95%   BMI 25.79 kg/m   Physical Exam Vitals reviewed.  Constitutional:      General: He is not in acute distress.    Appearance: Normal appearance. He is well-developed.  HENT:     Head: Normocephalic and atraumatic.     Nose: Nose normal.     Mouth/Throat:     Pharynx: Oropharynx is clear. No oropharyngeal exudate.  Eyes:     General: No scleral icterus.    Conjunctiva/sclera: Conjunctivae normal.  Neck:     Thyroid: No thyromegaly.  Cardiovascular:     Rate and Rhythm: Normal rate and regular rhythm.     Heart sounds: No murmur heard. Pulmonary:     Effort: Pulmonary effort is normal.     Breath sounds: Normal breath sounds. No wheezing, rhonchi or rales.  Abdominal:     General: There is no distension.     Palpations: Abdomen is  soft.     Tenderness: There is no abdominal tenderness. There is no guarding or rebound.  Musculoskeletal:     Cervical back: Neck supple.  Lymphadenopathy:     Cervical: No cervical adenopathy.  Skin:    General: Skin is warm and dry.     Findings: No rash.  Neurological:     Mental Status: He is alert.      Assessment & Plan:   Problem List Items Addressed This Visit       Cardiovascular and Mediastinum   CAD (coronary artery disease)    Nonobstructive.  Asymptomatic currently.      Relevant Orders   Lipid panel   Migraine    Stable on Topamax and Maxalt.  Continue.      Relevant Medications   traZODone (DESYREL) 50 MG tablet   traZODone (DESYREL) 50 MG tablet     Respiratory   COPD with emphysema (HCC)    Stable.  Getting regular CT screening for cancer.        RESOLVED: Pulmonary nodule     Digestive   GERD (gastroesophageal reflux disease)    Stable on Protonix.  Continue.        Other   RESOLVED: Bradycardia   Relevant Orders   CMP14+EGFR   Insomnia - Primary    Trial of trazodone.      PTSD (post-traumatic stress disorder)    Followed at the [place].      Relevant Medications   traZODone (DESYREL) 50 MG tablet   traZODone (DESYREL) 50 MG tablet   diazepam (VALIUM) 10 MG tablet   Other Visit Diagnoses     Screening for deficiency anemia       Relevant Orders   CBC   Prostate cancer screening       Relevant Orders   PSA       Meds ordered this encounter  Medications   traZODone (DESYREL) 50 MG tablet    Sig: Take 1 tablet (50 mg total) by mouth at bedtime.    Dispense:  90 tablet    Refill:  0   traZODone (DESYREL) 50 MG tablet    Sig: Take 1 tablet (50 mg total) by mouth at bedtime as needed for sleep.    Dispense:  30 tablet    Refill:  0   diazepam (VALIUM) 10 MG tablet    Sig: Take 1 tablet (10 mg total) by mouth at bedtime as needed for anxiety.    Dispense:  90 tablet    Refill:  0     Follow-up: 6 months  [place] DO [place]

## 2022-04-08 NOTE — Assessment & Plan Note (Signed)
Stable on Protonix.  Continue. 

## 2022-04-08 NOTE — Assessment & Plan Note (Signed)
Stable.  Getting regular CT screening for cancer.

## 2022-04-09 DIAGNOSIS — I2510 Atherosclerotic heart disease of native coronary artery without angina pectoris: Secondary | ICD-10-CM | POA: Diagnosis not present

## 2022-04-09 DIAGNOSIS — R001 Bradycardia, unspecified: Secondary | ICD-10-CM | POA: Diagnosis not present

## 2022-04-09 DIAGNOSIS — Z130 Encounter for screening for diseases of the blood and blood-forming organs and certain disorders involving the immune mechanism: Secondary | ICD-10-CM | POA: Diagnosis not present

## 2022-04-09 DIAGNOSIS — Z125 Encounter for screening for malignant neoplasm of prostate: Secondary | ICD-10-CM | POA: Diagnosis not present

## 2022-04-09 DIAGNOSIS — M25512 Pain in left shoulder: Secondary | ICD-10-CM | POA: Diagnosis not present

## 2022-04-10 LAB — CMP14+EGFR
ALT: 26 IU/L (ref 0–44)
AST: 24 IU/L (ref 0–40)
Albumin/Globulin Ratio: 2.4 — ABNORMAL HIGH (ref 1.2–2.2)
Albumin: 4.8 g/dL (ref 3.8–4.9)
Alkaline Phosphatase: 93 IU/L (ref 44–121)
BUN/Creatinine Ratio: 10 (ref 9–20)
BUN: 13 mg/dL (ref 6–24)
Bilirubin Total: 0.4 mg/dL (ref 0.0–1.2)
CO2: 25 mmol/L (ref 20–29)
Calcium: 9.4 mg/dL (ref 8.7–10.2)
Chloride: 101 mmol/L (ref 96–106)
Creatinine, Ser: 1.29 mg/dL — ABNORMAL HIGH (ref 0.76–1.27)
Globulin, Total: 2 g/dL (ref 1.5–4.5)
Glucose: 94 mg/dL (ref 70–99)
Potassium: 4.9 mmol/L (ref 3.5–5.2)
Sodium: 140 mmol/L (ref 134–144)
Total Protein: 6.8 g/dL (ref 6.0–8.5)
eGFR: 64 mL/min/{1.73_m2} (ref >59)

## 2022-04-10 LAB — LIPID PANEL
Chol/HDL Ratio: 3.3 ratio (ref 0.0–5.0)
Cholesterol, Total: 206 mg/dL — ABNORMAL HIGH (ref 100–199)
HDL: 63 mg/dL (ref >39)
LDL Chol Calc (NIH): 129 mg/dL — ABNORMAL HIGH (ref 0–99)
Triglycerides: 80 mg/dL (ref 0–149)
VLDL Cholesterol Cal: 14 mg/dL (ref 5–40)

## 2022-04-10 LAB — CBC
Hematocrit: 49.4 % (ref 37.5–51.0)
Hemoglobin: 16.6 g/dL (ref 13.0–17.7)
MCH: 32 pg (ref 26.6–33.0)
MCHC: 33.6 g/dL (ref 31.5–35.7)
MCV: 95 fL (ref 79–97)
Platelets: 209 10*3/uL (ref 150–450)
RBC: 5.19 x10E6/uL (ref 4.14–5.80)
RDW: 12.3 % (ref 11.6–15.4)
WBC: 6.9 10*3/uL (ref 3.4–10.8)

## 2022-04-10 LAB — PSA: Prostate Specific Ag, Serum: 0.9 ng/mL (ref 0.0–4.0)

## 2022-05-01 ENCOUNTER — Other Ambulatory Visit: Payer: Self-pay | Admitting: Family Medicine

## 2022-06-25 DIAGNOSIS — G43909 Migraine, unspecified, not intractable, without status migrainosus: Secondary | ICD-10-CM | POA: Diagnosis not present

## 2022-06-25 DIAGNOSIS — F4310 Post-traumatic stress disorder, unspecified: Secondary | ICD-10-CM | POA: Diagnosis not present

## 2022-06-25 DIAGNOSIS — F419 Anxiety disorder, unspecified: Secondary | ICD-10-CM | POA: Diagnosis not present

## 2022-06-25 DIAGNOSIS — G8929 Other chronic pain: Secondary | ICD-10-CM | POA: Diagnosis not present

## 2022-06-25 DIAGNOSIS — K219 Gastro-esophageal reflux disease without esophagitis: Secondary | ICD-10-CM | POA: Diagnosis not present

## 2022-06-25 DIAGNOSIS — Z87891 Personal history of nicotine dependence: Secondary | ICD-10-CM | POA: Diagnosis not present

## 2022-06-25 DIAGNOSIS — I2510 Atherosclerotic heart disease of native coronary artery without angina pectoris: Secondary | ICD-10-CM | POA: Diagnosis not present

## 2022-06-25 DIAGNOSIS — F330 Major depressive disorder, recurrent, mild: Secondary | ICD-10-CM | POA: Diagnosis not present

## 2022-06-25 DIAGNOSIS — N400 Enlarged prostate without lower urinary tract symptoms: Secondary | ICD-10-CM | POA: Diagnosis not present

## 2022-06-25 DIAGNOSIS — J449 Chronic obstructive pulmonary disease, unspecified: Secondary | ICD-10-CM | POA: Diagnosis not present

## 2022-06-25 DIAGNOSIS — E663 Overweight: Secondary | ICD-10-CM | POA: Diagnosis not present

## 2022-06-25 DIAGNOSIS — N529 Male erectile dysfunction, unspecified: Secondary | ICD-10-CM | POA: Diagnosis not present

## 2022-07-31 DIAGNOSIS — L578 Other skin changes due to chronic exposure to nonionizing radiation: Secondary | ICD-10-CM | POA: Diagnosis not present

## 2022-07-31 DIAGNOSIS — L821 Other seborrheic keratosis: Secondary | ICD-10-CM | POA: Diagnosis not present

## 2022-07-31 DIAGNOSIS — D2261 Melanocytic nevi of right upper limb, including shoulder: Secondary | ICD-10-CM | POA: Diagnosis not present

## 2022-07-31 DIAGNOSIS — D225 Melanocytic nevi of trunk: Secondary | ICD-10-CM | POA: Diagnosis not present

## 2022-07-31 DIAGNOSIS — L309 Dermatitis, unspecified: Secondary | ICD-10-CM | POA: Diagnosis not present

## 2022-07-31 DIAGNOSIS — Z808 Family history of malignant neoplasm of other organs or systems: Secondary | ICD-10-CM | POA: Diagnosis not present

## 2022-07-31 DIAGNOSIS — B353 Tinea pedis: Secondary | ICD-10-CM | POA: Diagnosis not present

## 2022-07-31 DIAGNOSIS — L219 Seborrheic dermatitis, unspecified: Secondary | ICD-10-CM | POA: Diagnosis not present

## 2022-10-04 ENCOUNTER — Ambulatory Visit (HOSPITAL_COMMUNITY)
Admission: RE | Admit: 2022-10-04 | Discharge: 2022-10-04 | Disposition: A | Discharge status: Home / Self Care | Payer: PPO | Source: Ambulatory Visit | Attending: Internal Medicine | Admitting: Internal Medicine

## 2022-10-04 ENCOUNTER — Ambulatory Visit (HOSPITAL_BASED_OUTPATIENT_CLINIC_OR_DEPARTMENT_OTHER): Payer: PPO

## 2022-10-04 DIAGNOSIS — J438 Other emphysema: Secondary | ICD-10-CM | POA: Diagnosis not present

## 2022-10-04 DIAGNOSIS — R918 Other nonspecific abnormal finding of lung field: Secondary | ICD-10-CM | POA: Insufficient documentation

## 2022-10-04 DIAGNOSIS — I700 Atherosclerosis of aorta: Secondary | ICD-10-CM | POA: Diagnosis not present

## 2022-10-04 DIAGNOSIS — Z87891 Personal history of nicotine dependence: Secondary | ICD-10-CM | POA: Diagnosis not present

## 2022-10-04 DIAGNOSIS — J432 Centrilobular emphysema: Secondary | ICD-10-CM | POA: Insufficient documentation

## 2022-10-04 DIAGNOSIS — Z122 Encounter for screening for malignant neoplasm of respiratory organs: Secondary | ICD-10-CM | POA: Insufficient documentation

## 2022-10-04 DIAGNOSIS — I2510 Atherosclerotic heart disease of native coronary artery without angina pectoris: Secondary | ICD-10-CM | POA: Insufficient documentation

## 2022-10-04 DIAGNOSIS — R911 Solitary pulmonary nodule: Secondary | ICD-10-CM

## 2022-10-10 ENCOUNTER — Other Ambulatory Visit: Payer: Self-pay | Admitting: Emergency Medicine

## 2022-10-10 DIAGNOSIS — J431 Panlobular emphysema: Secondary | ICD-10-CM

## 2022-10-10 DIAGNOSIS — R911 Solitary pulmonary nodule: Secondary | ICD-10-CM

## 2022-10-10 DIAGNOSIS — G4733 Obstructive sleep apnea (adult) (pediatric): Secondary | ICD-10-CM

## 2022-10-15 ENCOUNTER — Ambulatory Visit (HOSPITAL_COMMUNITY): Payer: PPO

## 2022-10-30 ENCOUNTER — Other Ambulatory Visit: Payer: Self-pay | Admitting: *Deleted

## 2022-10-30 DIAGNOSIS — Z122 Encounter for screening for malignant neoplasm of respiratory organs: Secondary | ICD-10-CM

## 2022-10-30 DIAGNOSIS — Z87891 Personal history of nicotine dependence: Secondary | ICD-10-CM

## 2022-11-26 ENCOUNTER — Ambulatory Visit (INDEPENDENT_AMBULATORY_CARE_PROVIDER_SITE_OTHER): Payer: PPO | Admitting: Family Medicine

## 2022-11-26 VITALS — BP 118/79 | HR 67 | Temp 97.9°F | Ht 70.87 in | Wt 186.8 lb

## 2022-11-26 DIAGNOSIS — G4700 Insomnia, unspecified: Secondary | ICD-10-CM | POA: Diagnosis not present

## 2022-11-26 DIAGNOSIS — K219 Gastro-esophageal reflux disease without esophagitis: Secondary | ICD-10-CM | POA: Diagnosis not present

## 2022-11-26 MED ORDER — TEMAZEPAM 15 MG PO CAPS: 15.0000 mg | ORAL_CAPSULE | Freq: Every evening | ORAL | 0 refills | Status: DC | PRN

## 2022-11-26 NOTE — Progress Notes (Signed)
Subjective:  Patient ID: Peter Mahoney, male    DOB: 12/28/1960  Age: 62 y.o. MRN: [id number]  CC: Chief Complaint  Patient presents with   Referral    Pt comes is here today to discuss a referral for him to get an endoscopy.     HPI:  62 year old male coronary artery disease, COPD, GERD, PTSD, insomnia presents for evaluation of the above.  Patient reports that he saw provider through the [place].  She recommended that he have another endoscopy.  He states that he desires a referral to a local provider for this.  She states that he is having GERD.  He takes Protonix and still has breakthrough symptoms quite often. No weight loss or other alarm symptoms.  Patient also reports continued issues with insomnia.  He currently uses Valium using this for quite some time.  He states that he gets about 4 hours of sleep.  He has tried trazodone but states that it does not help.  Patient also reports ongoing pain bilaterally.  Has been seen by orthopedics previously.  She had injections.  Patient also states he has a trigger finger of his right middle finger.  Patient Active Problem List   Diagnosis Date Noted   CAD (coronary artery disease) 04/08/2022   PTSD (post-traumatic stress disorder) 04/08/2022   Migraine 04/08/2022   Insomnia 04/08/2022   COPD with emphysema (HCC) 07/28/2019   Erectile dysfunction 01/26/2016   GERD (gastroesophageal reflux disease) 10/14/2011   Review of Systems Per HPI  Objective:  BP 118/79   Pulse 67   Temp 97.9 F (36.6 C)   Ht 5' 10.87" (1.8 m)   Wt 186 lb 12.8 oz (84.7 kg)   SpO2 98%   BMI 26.15 kg/m      11/26/2022   10:41 AM 04/08/2022    1:56 PM 03/29/2022   12:51 PM  BP/Weight  Systolic BP 118 100 130  Diastolic BP 79 70 82  Wt. (Lbs) 186.8 184.2 185  BMI 26.15 kg/m2 25.79 kg/m2 26.54 kg/m2    Physical Exam Vitals and nursing note reviewed.  Constitutional:      General: He is not in acute distress.    Appearance: Normal appearance.   HENT:     Head: Normocephalic and atraumatic.  Eyes:     General:        Right eye: No discharge.        Left eye: No discharge.     Conjunctiva/sclera: Conjunctivae normal.  Cardiovascular:     Rate and Rhythm: Normal rate and regular rhythm.  Pulmonary:     Effort: Pulmonary effort is normal.     Breath sounds: Normal breath sounds.  Neurological:     Mental Status: He is alert.     Lab Results  Component Value Date   WBC 6.9 04/09/2022   HGB 16.6 04/09/2022   HCT 49.4 04/09/2022   PLT 209 04/09/2022   GLUCOSE 94 04/09/2022   CHOL 206 (H) 04/09/2022   TRIG 80 04/09/2022   HDL 63 04/09/2022   LDLCALC 129 (H) 04/09/2022   ALT 26 04/09/2022   AST 24 04/09/2022   NA 140 04/09/2022   K 4.9 04/09/2022   CL 101 04/09/2022   CREATININE 1.29 (H) 04/09/2022   BUN 13 04/09/2022   CO2 25 04/09/2022     Assessment & Plan:   Problem List Items Addressed This Visit       Digestive   GERD (gastroesophageal reflux disease) - Primary  Frequent persistent symptoms despite PPI.  Referring for endoscopy      Relevant Orders   Ambulatory referral to Gastroenterology     Other   Insomnia    Uncontrolled.  Trial of temazepam.      Relevant Medications   temazepam (RESTORIL) 15 MG capsule    Meds ordered this encounter  Medications   temazepam (RESTORIL) 15 MG capsule    Sig: Take 1 capsule (15 mg total) by mouth at bedtime as needed for sleep.    Dispense:  30 capsule    Refill:  0      DO [facility]

## 2022-11-26 NOTE — Assessment & Plan Note (Signed)
Frequent persistent symptoms despite PPI.  Referring for endoscopy

## 2022-11-26 NOTE — Assessment & Plan Note (Signed)
Uncontrolled.  Trial of temazepam.

## 2022-11-26 NOTE — Patient Instructions (Signed)
Referral placed.  Trial of Restoril.  Consider seeing another orthopedist (like [facility]).

## 2022-12-16 ENCOUNTER — Other Ambulatory Visit: Payer: Self-pay | Admitting: Family Medicine

## 2022-12-16 ENCOUNTER — Encounter: Payer: Self-pay | Admitting: Family Medicine

## 2022-12-17 ENCOUNTER — Other Ambulatory Visit: Payer: Self-pay | Admitting: Family Medicine

## 2022-12-17 MED ORDER — DIAZEPAM 10 MG PO TABS
10.0000 mg | ORAL_TABLET | Freq: Every evening | ORAL | 0 refills | Status: DC | PRN
Start: 2022-12-17 — End: 2023-03-26

## 2022-12-17 NOTE — Telephone Encounter (Signed)
Cook, Jayce G, DO     Sent.    

## 2022-12-18 ENCOUNTER — Encounter: Payer: Self-pay | Admitting: Internal Medicine

## 2022-12-18 ENCOUNTER — Ambulatory Visit (INDEPENDENT_AMBULATORY_CARE_PROVIDER_SITE_OTHER): Payer: PPO | Admitting: Internal Medicine

## 2022-12-18 VITALS — BP 137/78 | HR 70 | Temp 98.1°F | Ht 70.0 in | Wt 185.3 lb

## 2022-12-18 DIAGNOSIS — K219 Gastro-esophageal reflux disease without esophagitis: Secondary | ICD-10-CM

## 2022-12-18 DIAGNOSIS — Z1211 Encounter for screening for malignant neoplasm of colon: Secondary | ICD-10-CM | POA: Diagnosis not present

## 2022-12-18 NOTE — Progress Notes (Signed)
Primary Care Physician:  Tommie Sams, DO Primary Gastroenterologist:  Dr. Marletta Lor  Chief Complaint  Patient presents with   New Patient (Initial Visit)    Pt referred for GERD    HPI:   Peter Mahoney is a 62 y.o. male who presents to clinic today by her from his PCP Dr. Adriana Simas for evaluation.  Patient states he has had acid reflux for over 30 years.  Reports having an upper endoscopy done in the 1990s, unsure of results.  Currently taking pantoprazole 40 mg daily and having breakthrough symptoms 1-2 times per week.  Symptoms mild, intermittent.  Notes burning sensation.  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  No epigastric or chest pain.  No dysphagia odynophagia.  No melena hematochezia.  Last colonoscopy 2017 unremarkable.  Denies any family history of colorectal malignancy.  No abdominal pain, no unintentional weight loss.  Past Medical History:  Diagnosis Date   Dental crown present    GERD (gastroesophageal reflux disease)    Post-operative infection 09/21/2011   left leg   PTSD (post-traumatic stress disorder)    Seasonal allergies    Wound infection after surgery, left lateral leg 10/08/2011    Past Surgical History:  Procedure Laterality Date   BACK SURGERY  04/1995   L5-S1   COLONOSCOPY N/A 05/12/2015   Procedure: COLONOSCOPY;  Surgeon: Franky Macho, MD;  [facility];  Service: Gastroenterology;  Laterality: N/A;   INCISION AND DRAINAGE OF WOUND  10/08/2011   Procedure: IRRIGATION AND DEBRIDEMENT WOUND;  Surgeon: Eulas Post, MD;  Location: [facility];  Service: Orthopedics;  Laterality: Left;  left left post op wound   KNEE ARTHROSCOPY  11/2010   left knee - meniscus tear   KNEE SURGERY  08/26/2011   hamstring tendon tear left   NM MYOVIEW LTD  11/17/2012   Exercise 7 minutes. EF 60%. No ischemia or infarction.; Low risk   TONSILLECTOMY     as a child   TRANSTHORACIC ECHOCARDIOGRAM  01/2016   Normal LV function EF  5560%. No regional wall motion abnormality. 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 Mild LA dilation. Otherwise normal    Current Outpatient Medications  Medication Sig Dispense Refill   albuterol (VENTOLIN HFA) 108 (90 Base) MCG/ACT inhaler Inhale 2 puffs into the lungs every 6 (six) hours as needed for wheezing or shortness of breath. 18 g 5   cyclobenzaprine (FLEXERIL) 10 MG tablet Take 10 mg by mouth daily.     diazepam (VALIUM) 10 MG tablet Take 1 tablet (10 mg total) by mouth at bedtime as needed for anxiety. 90 tablet 0   diclofenac Sodium (VOLTAREN) 1 % GEL Apply topically 4 (four) times daily.     pantoprazole (PROTONIX) 20 MG tablet Take 40 mg by mouth once.     rizatriptan (MAXALT) 10 MG tablet Take 10 mg by mouth as needed.     sildenafil (VIAGRA) 100 MG tablet TAKE 1 TABLET BY MOUTH AS NEEDED FOR ERECTILE DYSFUNCTION 30 tablet 0   tamsulosin (FLOMAX) 0.4 MG CAPS capsule TAKE 1 CAPSULE BY MOUTH EVERY DAY AFTER SUPPER (Patient taking differently: 0.4 mg in the morning and at bedtime.) 90 capsule 3   topiramate (TOPAMAX) 25 MG tablet TAKE ONE TABLET BY MOUTH ONCE A DAY FOR 1 WEEK, THEN TAKE TWO TABLETS EVERY EVENING THEREAFTER     No current facility-administered medications for this visit.  Allergies as of 12/18/2022 - Review Complete 12/18/2022  Allergen Reaction Noted   Dilaudid [hydromorphone hcl] Other (See Comments) 06/25/2018   Doxycycline Nausea And Vomiting 05/19/2015   Oxycodone  07/28/2019    Family History  Problem Relation Age of Onset   Heart Problems Mother    Stroke Mother    Pulmonary fibrosis Mother    Cancer - Lung Father        smoked   Heart failure Maternal Grandmother    Emphysema Maternal Grandfather        "his whole family had Emphysema"   Diabetes Paternal Grandmother    Cancer Paternal Grandfather     Social History   Socioeconomic History   Marital status: Married    Spouse name:  [name]   Number of children: 2   Years of education: 12   Highest education level: Tax adviser degree: occupational, Scientist, product/process development, or vocational program  Occupational History    Employer: [employer]    Comment: [employer]  Tobacco Use   Smoking status: Former    Current packs/day: 0.00    Average packs/day: 1 pack/day for 36.5 years (36.5 ttl pk-yrs)    Types: Cigarettes    Start date: 30    Quit date: 11/01/2015    Years since quitting: 7.1    Passive exposure: Past   Smokeless tobacco: Never  Vaping Use   Vaping status: Never Used  Substance and Sexual Activity   Alcohol use: Yes    Alcohol/week: 2.0 standard drinks of alcohol    Types: 2 Standard drinks or equivalent per week    Comment: occasionally   Drug use: No   Sexual activity: Not on file  Other Topics Concern   Not on file  Social History Narrative   Married father of 2, Grandfather of 3. Wife's name is [name].   He works as a Passenger transport manager for [employer] 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   Currently smokes a pack a day, and drinks maybe 2-3 alcohol beverages a week.         Epworth Sleepiness Scale      Score: 9      --I seem to be losing my sex drive   --I feel stressed and lack motivation   --I have COPD   --I have been told that I snore   --I am overweight or am gaining weight   --I awake feeling not rested   Social Determinants of Health   Financial Resource Strain: Low Risk  (11/22/2022)   Overall Financial Resource Strain (CARDIA)    Difficulty of Paying Living Expenses: Not hard at all  Food Insecurity: No Food Insecurity (11/22/2022)   Hunger Vital Sign    Worried About Running Out of Food in the Last Year: Never true    Ran Out of Food in the Last Year: Never true  Transportation Needs: No Transportation Needs (11/22/2022)   PRAPARE - Administrator, Civil Service (Medical): No    Lack of Transportation (Non-Medical): No  Physical Activity: Unknown (11/22/2022)    Exercise Vital Sign    Days of Exercise per Week: 0 days    Minutes of Exercise per Session: Not on file  Stress: Stress Concern Present (11/22/2022)   [employer] of Occupational Health - Occupational Stress Questionnaire    Feeling of Stress : Very much  Social Connections: Moderately Isolated (11/22/2022)   Social Connection and Isolation Panel [NHANES]    Frequency  of Communication with Friends and Family: Once a week    Frequency of Social Gatherings with Friends and Family: Once a week    Attends Religious Services: 1 to 4 times per year    Active Member of [employer] or Organizations: No    Attends Engineer, structural: Not on file    Marital Status: Married  Catering manager Violence: Not on file    Subjective: Review of Systems  Constitutional:  Negative for chills and fever.  HENT:  Negative for congestion and hearing loss.   Eyes:  Negative for blurred vision and double vision.  Respiratory:  Negative for cough and shortness of breath.   Cardiovascular:  Negative for chest pain and palpitations.  Gastrointestinal:  Positive for heartburn. Negative for abdominal pain, blood in stool, constipation, diarrhea, melena and vomiting.  Genitourinary:  Negative for dysuria and urgency.  Musculoskeletal:  Negative for joint pain and myalgias.  Skin:  Negative for itching and rash.  Neurological:  Negative for dizziness and headaches.  Psychiatric/Behavioral:  Negative for depression. The patient is not nervous/anxious.        Objective: BP 137/78   Pulse 70   Temp 98.1 F (36.7 C)   Ht 5\' 10"  (1.778 m)   Wt 185 lb 4.8 oz (84.1 kg)   BMI 26.59 kg/m  Physical Exam Constitutional:      Appearance: Normal appearance.  HENT:     Head: Normocephalic and atraumatic.  Eyes:     Extraocular Movements: Extraocular movements intact.     Conjunctiva/sclera: Conjunctivae normal.  Cardiovascular:     Rate and Rhythm: Normal rate and regular rhythm.  Pulmonary:      Effort: Pulmonary effort is normal.     Breath sounds: Normal breath sounds.  Abdominal:     General: Bowel sounds are normal.     Palpations: Abdomen is soft.  Musculoskeletal:        General: Normal range of motion.     Cervical back: Normal range of motion and neck supple.  Skin:    General: Skin is warm.  Neurological:     General: No focal deficit present.     Mental Status: He is alert and oriented to person, place, and time.  Psychiatric:        Mood and Affect: Mood normal.        Behavior: Behavior normal.      Assessment: *Chronic GERD-not well controlled on pantoprazole 40mg  daily *Barrett's screening *Colon cancer screening  Plan: Will schedule for EGD to evaluate for peptic ulcer disease, esophagitis, gastritis, H. Pylori, duodenitis, or other. Will also evaluate for esophageal stricture, Schatzki's ring, esophageal web or other.   Patient wishes to update his colonoscopy for colon cancer screening purposes, so we will plan on performing this at the same time as his upper endoscopy.  The risks including infection, bleed, or perforation as well as benefits, limitations, alternatives and imponderables have been reviewed with the patient. Potential for esophageal dilation, biopsy, etc. have also been reviewed.  Questions have been answered. All parties agreeable.  Continue on pantoprazole 40 mg daily for now, may make adjustments pending endoscopic findings.  Thank you Dr. Adriana Simas for the kind referral 12/18/2022 1:54 PM   Disclaimer: This note was dictated with voice recognition software. Similar sounding words can inadvertently be transcribed and may not be corrected upon review.

## 2022-12-18 NOTE — Patient Instructions (Signed)
We will schedule you for upper endoscopy to further evaluate your chronic reflux as well as screening for Barrett's esophagus.  At the same time I will perform colonoscopy for colon cancer screening purposes.  Continue on pantoprazole daily for now, we may make adjustments pending endoscopic evaluation.  It was very nice meeting you today.  Dr. Marletta Lor

## 2023-01-02 ENCOUNTER — Encounter: Payer: Self-pay | Admitting: Emergency Medicine

## 2023-01-02 ENCOUNTER — Ambulatory Visit: Payer: PPO | Admitting: Emergency Medicine

## 2023-01-02 VITALS — BP 110/72 | HR 65 | Temp 97.6°F | Ht 70.0 in | Wt 188.0 lb

## 2023-01-02 DIAGNOSIS — J431 Panlobular emphysema: Secondary | ICD-10-CM | POA: Diagnosis not present

## 2023-01-02 DIAGNOSIS — Z87891 Personal history of nicotine dependence: Secondary | ICD-10-CM

## 2023-01-02 NOTE — Patient Instructions (Addendum)
We reviewed your CT scan of the chest today.  Please get your repeat CT through the lung cancer screening program in June 2025 as planned Keep your albuterol available to use 2 puffs when needed for shortness of breath, chest tightness, wheezing. We will hold off on starting a scheduled inhaler medication for now.  We may decide to do so in the future We will repeat her walking oximetry at your next office visit Follow-up with Dr. Delton Coombes in June 2025 so we can review your scan.

## 2023-01-02 NOTE — Assessment & Plan Note (Signed)
Emphysematous COPD.  He has overall been stable although still is limited by his dyspnea.  He has been on multiple long-acting BD before without any benefit.  I think the emphysema drives his dyspnea.  He has not desaturated with ambulation in the past.  Will check this again next visit.  For now I will hold off on scheduled BD.  If he clinically changes then I think we should retry.

## 2023-01-02 NOTE — Progress Notes (Signed)
Subjective:    Patient ID: Peter Mahoney, male    DOB: 09-Oct-1960, 62 y.o.   MRN: [id number]  HPI  ROV 01/02/2023 --Peter Mahoney is 62 with a history of emphysema/obstructive lung disease.  He is had dyspnea at a proportion to his pulmonary function testing.  Not currently on scheduled BD therapy.  He has not desaturated in the past.  Today he reports that his breathing is about the same. Worse in the cool weather. Minimal benefit from his albuterol. He can get SOB walking through the house.   He has participated in lung cancer screening program, last CT chest 10/04/2022 which was reassuring, RADS 2 study.  It did confirm his centrilobular and paraseptal emphysema, scattered nodules that were stable largest 9.3 mm   Review of Systems As above  Past Medical History:  Diagnosis Date   Dental crown present    GERD (gastroesophageal reflux disease)    Post-operative infection 09/21/2011   left leg   PTSD (post-traumatic stress disorder)    Seasonal allergies    Wound infection after surgery, left lateral leg 10/08/2011     Family History  Problem Relation Age of Onset   Heart Problems Mother    Stroke Mother    Pulmonary fibrosis Mother    Cancer - Lung Father        smoked   Heart failure Maternal Grandmother    Emphysema Maternal Grandfather        "his whole family had Emphysema"   Diabetes Paternal Grandmother    Cancer Paternal Grandfather      Social History   Socioeconomic History   Marital status: Married    Spouse name: Peter Mahoney   Number of children: 2   Years of education: 12   Highest education level: Tax adviser degree: occupational, Scientist, product/process development, or vocational program  Occupational History    Employer: [employer]    Comment: [employer]  Tobacco Use   Smoking status: Former    Current packs/day: 0.00    Average packs/day: 1 pack/day for 36.5 years (36.5 ttl pk-yrs)    Types: Cigarettes    Start date: 34    Quit date: 11/01/2015    Years since quitting: 7.1    Passive  exposure: Past   Smokeless tobacco: Never  Vaping Use   Vaping status: Never Used  Substance and Sexual Activity   Alcohol use: Yes    Alcohol/week: 2.0 standard drinks of alcohol    Types: 2 Standard drinks or equivalent per week    Comment: occasionally   Drug use: No   Sexual activity: Not on file  Other Topics Concern   Not on file  Social History Narrative   Married father of 2, Grandfather of 3. Wife's name is Peter Mahoney.   He works as a Passenger transport manager for [employer] 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   Currently smokes a pack a day, and drinks maybe 2-3 alcohol beverages a week.         Epworth Sleepiness Scale      Score: 9      --I seem to be losing my sex drive   --I feel stressed and lack motivation   --I have COPD   --I have been told that I snore   --I am overweight or am gaining weight   --I awake feeling not rested   Social Determinants of Health   Financial Resource Strain: Low Risk  (11/22/2022)   Overall Financial Resource  Strain (CARDIA)    Difficulty of Paying Living Expenses: Not hard at all  Food Insecurity: No Food Insecurity (11/22/2022)   Hunger Vital Sign    Worried About Running Out of Food in the Last Year: Never true    Ran Out of Food in the Last Year: Never true  Transportation Needs: No Transportation Needs (11/22/2022)   PRAPARE - Administrator, Civil Service (Medical): No    Lack of Transportation (Non-Medical): No  Physical Activity: Unknown (11/22/2022)   Exercise Vital Sign    Days of Exercise per Week: 0 days    Minutes of Exercise per Session: Not on file  Stress: Stress Concern Present (11/22/2022)   [employer] of Occupational Health - Occupational Stress Questionnaire    Feeling of Stress : Very much  Social Connections: Moderately Isolated (11/22/2022)   Social Connection and Isolation Panel [NHANES]    Frequency of Communication with Friends and Family: Once a week    Frequency of Social  Gatherings with Friends and Family: Once a week    Attends Religious Services: 1 to 4 times per year    Active Member of [employer] or Organizations: No    Attends Engineer, structural: Not on file    Marital Status: Married  Catering manager Violence: Not on Publishing copy  Cigarette company   Allergies  Allergen Reactions   Dilaudid [Hydromorphone Hcl] Other (See Comments)    Urinary retention- says with most opioids taken   Azithromycin Diarrhea   Doxycycline Nausea And Vomiting   Oxycodone      Outpatient Medications Prior to Visit  Medication Sig Dispense Refill   albuterol (VENTOLIN HFA) 108 (90 Base) MCG/ACT inhaler Inhale 2 puffs into the lungs every 6 (six) hours as needed for wheezing or shortness of breath. 18 g 5   cyclobenzaprine (FLEXERIL) 10 MG tablet Take 10 mg by mouth daily.     diazepam (VALIUM) 10 MG tablet Take 1 tablet (10 mg total) by mouth at bedtime as needed for anxiety. 90 tablet 0   diclofenac Sodium (VOLTAREN) 1 % GEL Apply topically 4 (four) times daily.     pantoprazole (PROTONIX) 20 MG tablet Take 40 mg by mouth once.     rizatriptan (MAXALT) 10 MG tablet Take 10 mg by mouth as needed.     sildenafil (VIAGRA) 100 MG tablet TAKE 1 TABLET BY MOUTH AS NEEDED FOR ERECTILE DYSFUNCTION 30 tablet 0   tamsulosin (FLOMAX) 0.4 MG CAPS capsule TAKE 1 CAPSULE BY MOUTH EVERY DAY AFTER SUPPER (Patient taking differently: 0.4 mg in the morning and at bedtime.) 90 capsule 3   topiramate (TOPAMAX) 25 MG tablet TAKE ONE TABLET BY MOUTH ONCE A DAY FOR 1 WEEK, THEN TAKE TWO TABLETS EVERY EVENING THEREAFTER     No facility-administered medications prior to visit.         Objective:   Physical Exam Vitals:   01/02/23 1015  BP: 110/72  Pulse: 65  Temp: 97.6 F (36.4 C)  TempSrc: Oral  SpO2: 98%  Weight: 188 lb (85.3 kg)  Height: 5\' 10"  (1.778 m)    Gen: Pleasant, well-nourished, in no distress,  normal affect  ENT: No lesions,  mouth clear,   oropharynx clear, no postnasal drip  Neck: No JVD, no stridor  Lungs: No use of accessory muscles, distant, no crackles or wheezing on normal respiration, soft end expiratory wheeze on forced expiration  Cardiovascular: RRR, heart sounds normal, no murmur  or gallops, no peripheral edem  Musculoskeletal: No deformities, no cyanosis or clubbing  Neuro: alert, awake, non focal  Skin: Warm, no lesions or rash       Assessment & Plan:  COPD with emphysema (HCC) Emphysematous COPD.  He has overall been stable although still is limited by his dyspnea.  He has been on multiple long-acting BD before without any benefit.  I think the emphysema drives his dyspnea.  He has not desaturated with ambulation in the past.  Will check this again next visit.  For now I will hold off on scheduled BD.  If he clinically changes then I think we should retry.  History of tobacco abuse Participates in lung cancer screening program.  He had a reassuring scan in June 2024.  Next scan to be done in June 2025.     Levy Pupa, MD, PhD 01/02/2023, 10:52 AM [facility] [phone] or if no answer [phone]

## 2023-01-02 NOTE — Assessment & Plan Note (Signed)
Participates in lung cancer screening program.  He had a reassuring scan in June 2024.  Next scan to be done in June 2025.

## 2023-01-06 ENCOUNTER — Encounter: Payer: Self-pay | Admitting: *Deleted

## 2023-01-07 ENCOUNTER — Encounter: Payer: Self-pay | Admitting: *Deleted

## 2023-02-03 ENCOUNTER — Ambulatory Visit: Payer: PPO | Admitting: Urology

## 2023-02-03 VITALS — BP 112/72 | HR 93

## 2023-02-03 DIAGNOSIS — N138 Other obstructive and reflux uropathy: Secondary | ICD-10-CM

## 2023-02-03 DIAGNOSIS — N401 Enlarged prostate with lower urinary tract symptoms: Secondary | ICD-10-CM | POA: Diagnosis not present

## 2023-02-03 DIAGNOSIS — N529 Male erectile dysfunction, unspecified: Secondary | ICD-10-CM

## 2023-02-03 DIAGNOSIS — R3912 Poor urinary stream: Secondary | ICD-10-CM

## 2023-02-03 LAB — URINALYSIS, ROUTINE W REFLEX MICROSCOPIC
Bilirubin, UA: NEGATIVE
Glucose, UA: NEGATIVE
Ketones, UA: NEGATIVE
Leukocytes,UA: NEGATIVE
Nitrite, UA: NEGATIVE
Protein,UA: NEGATIVE
RBC, UA: NEGATIVE
Specific Gravity, UA: <1.005 — ABNORMAL LOW (ref 1.005–1.030)
Urobilinogen, Ur: 0.2 mg/dL (ref 0.2–1.0)
pH, UA: 7 (ref 5.0–7.5)

## 2023-02-03 MED ORDER — SILODOSIN 8 MG PO CAPS: 8.0000 mg | ORAL_CAPSULE | Freq: Every day | ORAL | 11 refills | Status: DC

## 2023-02-03 MED ORDER — SILDENAFIL CITRATE 100 MG PO TABS
100.0000 mg | ORAL_TABLET | ORAL | 4 refills | Status: DC | PRN
Start: 2023-02-03 — End: 2024-03-01

## 2023-02-03 NOTE — Progress Notes (Signed)
02/03/2023 1:25 PM   Peter Mahoney [birth year] [id number]  Referring provider: Elfredia Nevins, MD [address] [place],  [place] [address]  Followup BPh and erectile dysfunction   HPI: Peter Mahoney is a 62yo here for followup for BPH and erectile dysfunction. IPSS 14 QOL 4 on flomax 0.4mg  BID. He has post void dribbling. He has intermittent straining to urinate. He has an intermittent weak stream. He takes sildenafil 100mg  which works well sporadically. He has tried trimix with mixed results.    PMH: Past Medical History:  Diagnosis Date   Dental crown present    GERD (gastroesophageal reflux disease)    Post-operative infection 09/21/2011   left leg   PTSD (post-traumatic stress disorder)    Seasonal allergies    Wound infection after surgery, left lateral leg 10/08/2011    Surgical History: Past Surgical History:  Procedure Laterality Date   BACK SURGERY  04/1995   L5-S1   COLONOSCOPY N/A 05/12/2015   Procedure: COLONOSCOPY;  Surgeon: Franky Macho, MD;  Location[facility];  Service: Gastroenterology;  Laterality: N/A;   INCISION AND DRAINAGE OF WOUND  10/08/2011   Procedure: IRRIGATION AND DEBRIDEMENT WOUND;  Surgeon: Eulas Post, MD;  Location: [facility];  Service: Orthopedics;  Laterality: Left;  left left post op wound   KNEE ARTHROSCOPY  11/2010   left knee - meniscus tear   KNEE SURGERY  08/26/2011   hamstring tendon tear left   NM MYOVIEW LTD  11/17/2012   Exercise 7 minutes. EF 60%. No ischemia or infarction.; Low risk   TONSILLECTOMY     as a child   TRANSTHORACIC ECHOCARDIOGRAM  01/2016   Normal LV function EF 5560%. No regional wall motion abnormality. 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 Mild LA dilation. Otherwise normal    Home Medications:  Allergies as of 02/03/2023       Reactions   Dilaudid [hydromorphone Hcl] Other (See Comments)   Urinary retention- says with most  opioids taken   Azithromycin Diarrhea   Doxycycline Nausea And Vomiting   Oxycodone         Medication List        Accurate as of February 03, 2023  1:25 PM. If you have any questions, ask your nurse or doctor.          albuterol 108 (90 Base) MCG/ACT inhaler Commonly known as: VENTOLIN HFA Inhale 2 puffs into the lungs every 6 (six) hours as needed for wheezing or shortness of breath.   cyclobenzaprine 10 MG tablet Commonly known as: FLEXERIL Take 10 mg by mouth daily.   diazepam 10 MG tablet Commonly known as: VALIUM Take 1 tablet (10 mg total) by mouth at bedtime as needed for anxiety.   diclofenac Sodium 1 % Gel Commonly known as: VOLTAREN Apply topically 4 (four) times daily.   pantoprazole 20 MG tablet Commonly known as: PROTONIX Take 40 mg by mouth once.   rizatriptan 10 MG tablet Commonly known as: MAXALT Take 10 mg by mouth as needed.   sildenafil 100 MG tablet Commonly known as: VIAGRA TAKE 1 TABLET BY MOUTH AS NEEDED FOR ERECTILE DYSFUNCTION   tamsulosin 0.4 MG Caps capsule Commonly known as: FLOMAX TAKE 1 CAPSULE BY MOUTH EVERY DAY AFTER SUPPER What changed: See the new instructions.   topiramate 25 MG tablet Commonly known as: TOPAMAX TAKE ONE TABLET BY MOUTH ONCE A DAY FOR 1 WEEK, THEN TAKE TWO  TABLETS EVERY EVENING THEREAFTER        Allergies:  Allergies  Allergen Reactions   Dilaudid [Hydromorphone Hcl] Other (See Comments)    Urinary retention- says with most opioids taken   Azithromycin Diarrhea   Doxycycline Nausea And Vomiting   Oxycodone     Family History: Family History  Problem Relation Age of Onset   Heart Problems Mother    Stroke Mother    Pulmonary fibrosis Mother    Cancer - Lung Father        smoked   Heart failure Maternal Grandmother    Emphysema Maternal Grandfather        "his whole family had Emphysema"   Diabetes Paternal Grandmother    Cancer Paternal Grandfather     Social History:  reports that  he quit smoking about 7 years ago. His smoking use included cigarettes. He started smoking about 43 years ago. He has a 36.5 pack-year smoking history. He has been exposed to tobacco smoke. He has never used smokeless tobacco. He reports current alcohol use of about 2.0 standard drinks of alcohol per week. He reports that he does not use drugs.  ROS: All other review of systems were reviewed and are negative except what is noted above in HPI  Physical Exam: BP 112/72   Pulse 93   Constitutional:  Alert and oriented, No acute distress. HEENT: [place] AT, moist mucus membranes.  Trachea midline, no masses. Cardiovascular: No clubbing, cyanosis, or edema. Respiratory: Normal respiratory effort, no increased work of breathing. GI: Abdomen is soft, nontender, nondistended, no abdominal masses GU: No CVA tenderness.  Lymph: No cervical or inguinal lymphadenopathy. Skin: No rashes, bruises or suspicious lesions. Neurologic: Grossly intact, no focal deficits, moving all 4 extremities. Psychiatric: Normal mood and affect.  Laboratory Data: Lab Results  Component Value Date   WBC 6.9 04/09/2022   HGB 16.6 04/09/2022   HCT 49.4 04/09/2022   MCV 95 04/09/2022   PLT 209 04/09/2022    Lab Results  Component Value Date   CREATININE 1.29 (H) 04/09/2022    No results found for: "PSA"  No results found for: "TESTOSTERONE"  No results found for: "HGBA1C"  Urinalysis    Component Value Date/Time   APPEARANCEUR Clear 01/29/2022 1017   GLUCOSEU Negative 01/29/2022 1017   BILIRUBINUR Negative 01/29/2022 1017   PROTEINUR Negative 01/29/2022 1017   NITRITE Negative 01/29/2022 1017   LEUKOCYTESUR Negative 01/29/2022 1017    Lab Results  Component Value Date   LABMICR Comment 01/29/2022    Pertinent Imaging: *** No results found for this or any previous visit.  No results found for this or any previous visit.  No results found for this or any previous visit.  No results found for this  or any previous visit.  No results found for this or any previous visit.  No valid procedures specified. No results found for this or any previous visit.  No results found for this or any previous visit.   Assessment & Plan:    1. Benign prostatic hyperplasia with urinary obstruction We will trial rapaflo 8mg  daily - Urinalysis, Routine w reflex microscopic - BLADDER SCAN AMB NON-IMAGING  2. Weak urinary stream -rapaflo 8mg  daily  3. Erectile dysfunction due to arterial insufficiency -continue sildenafil    No follow-ups on file.  Wilkie Aye, MD  [facility] [place]

## 2023-02-03 NOTE — Progress Notes (Signed)
post void residual=9

## 2023-02-06 ENCOUNTER — Encounter (HOSPITAL_COMMUNITY)
Admission: RE | Admit: 2023-02-06 | Discharge: 2023-02-06 | Disposition: A | Discharge status: Home / Self Care | Payer: PPO | Source: Ambulatory Visit | Attending: Internal Medicine | Admitting: Internal Medicine

## 2023-02-10 ENCOUNTER — Encounter (HOSPITAL_COMMUNITY): Payer: Self-pay

## 2023-02-10 ENCOUNTER — Ambulatory Visit (HOSPITAL_COMMUNITY)
Admission: RE | Admit: 2023-02-10 | Discharge: 2023-02-10 | Disposition: A | Discharge status: Home / Self Care | Payer: PPO | Attending: Internal Medicine | Admitting: Internal Medicine

## 2023-02-10 ENCOUNTER — Encounter (HOSPITAL_COMMUNITY)
Admission: RE | Disposition: A | Discharge status: Home / Self Care | Payer: Self-pay | Source: Home / Self Care | Attending: Internal Medicine

## 2023-02-10 ENCOUNTER — Ambulatory Visit (HOSPITAL_COMMUNITY): Payer: PPO | Admitting: Anesthesiology

## 2023-02-10 DIAGNOSIS — K2970 Gastritis, unspecified, without bleeding: Secondary | ICD-10-CM

## 2023-02-10 DIAGNOSIS — K5730 Diverticulosis of large intestine without perforation or abscess without bleeding: Secondary | ICD-10-CM

## 2023-02-10 DIAGNOSIS — Z87891 Personal history of nicotine dependence: Secondary | ICD-10-CM | POA: Diagnosis not present

## 2023-02-10 DIAGNOSIS — K219 Gastro-esophageal reflux disease without esophagitis: Secondary | ICD-10-CM | POA: Insufficient documentation

## 2023-02-10 DIAGNOSIS — K449 Diaphragmatic hernia without obstruction or gangrene: Secondary | ICD-10-CM | POA: Insufficient documentation

## 2023-02-10 DIAGNOSIS — Z1211 Encounter for screening for malignant neoplasm of colon: Secondary | ICD-10-CM | POA: Diagnosis not present

## 2023-02-10 DIAGNOSIS — Z13810 Encounter for screening for upper gastrointestinal disorder: Secondary | ICD-10-CM | POA: Diagnosis not present

## 2023-02-10 DIAGNOSIS — I2510 Atherosclerotic heart disease of native coronary artery without angina pectoris: Secondary | ICD-10-CM | POA: Diagnosis not present

## 2023-02-10 HISTORY — PX: COLONOSCOPY WITH PROPOFOL: SHX5780

## 2023-02-10 HISTORY — PX: ESOPHAGOGASTRODUODENOSCOPY (EGD) WITH PROPOFOL: SHX5813

## 2023-02-10 HISTORY — PX: BIOPSY: SHX5522

## 2023-02-10 SURGERY — COLONOSCOPY WITH PROPOFOL
Anesthesia: General

## 2023-02-10 MED ORDER — PANTOPRAZOLE SODIUM 40 MG PO TBEC: 40.0000 mg | DELAYED_RELEASE_TABLET | Freq: Two times a day (BID) | ORAL | 11 refills | Status: DC

## 2023-02-10 MED ORDER — PROPOFOL 10 MG/ML IV BOLUS
INTRAVENOUS | Status: DC | PRN
  Administered 2023-02-10: 50 mg via INTRAVENOUS
  Administered 2023-02-10: 100 mg via INTRAVENOUS
  Administered 2023-02-10: 50 mg via INTRAVENOUS

## 2023-02-10 MED ORDER — LACTATED RINGERS IV SOLN: INTRAVENOUS | Status: DC | PRN

## 2023-02-10 MED ORDER — STERILE WATER FOR IRRIGATION IR SOLN
Status: DC | PRN
  Administered 2023-02-10: 100 mL

## 2023-02-10 MED ORDER — LIDOCAINE HCL 1 % IJ SOLN
INTRAMUSCULAR | Status: DC | PRN
  Administered 2023-02-10: 50 mg via INTRADERMAL

## 2023-02-10 MED ORDER — PROPOFOL 500 MG/50ML IV EMUL
INTRAVENOUS | Status: DC | PRN
  Administered 2023-02-10: 150 ug/kg/min via INTRAVENOUS

## 2023-02-10 NOTE — Op Note (Signed)
[facility] Patient Name: Peter Mahoney Procedure Date: 02/10/2023 8:17 AM MRN: [id number] Date of Birth: 17-Feb-1961 Attending MD: Hennie Duos. Marletta Lor , [place], [id number] CSN: [id number] Age: 62 Admit Type: Outpatient Procedure:                Colonoscopy Indications:              Screening for colorectal malignant neoplasm Providers:                Hennie Duos. Marletta Lor, DO, Angelica Ran, Dyann Ruddle Referring MD:              Medicines:                See the Anesthesia note for documentation of the                            administered medications Complications:            No immediate complications. Estimated Blood Loss:     Estimated blood loss: none. Procedure:                Pre-Anesthesia Assessment:                           - The anesthesia plan was to use monitored                            anesthesia care (MAC).                           After obtaining informed consent, the colonoscope                            was passed under direct vision. Throughout the                            procedure, the patient's blood pressure, pulse, and                            oxygen saturations were monitored continuously. The                            [id number]-[id number] ([id number]) scope was introduced through                            the anus and advanced to the the cecum, identified                            by appendiceal orifice and ileocecal valve. The                            colonoscopy was performed without difficulty. The                            patient tolerated the procedure well. The quality                            of  the bowel preparation was evaluated using the                            BBPS [facility] Bowel Preparation Scale) with scores                            of: Right Colon = 3, Transverse Colon = 3 and Left                            Colon = 3 (entire mucosa seen well with no residual                            staining, small fragments of stool or opaque                             liquid). The total BBPS score equals 9. Scope In: 8:35:32 AM Scope Out: 8:49:14 AM Scope Withdrawal Time: 0 hours 9 minutes 52 seconds  Total Procedure Duration: 0 hours 13 minutes 42 seconds  Findings:      Multiple medium-mouthed and small-mouthed diverticula were found in the       sigmoid colon and descending colon.      The exam was otherwise without abnormality. Impression:               - Diverticulosis in the sigmoid colon and in the                            descending colon.                           - The examination was otherwise normal.                           - No specimens collected. Moderate Sedation:      Per Anesthesia Care Recommendation:           - Patient has a contact number available for                            emergencies. The signs and symptoms of potential                            delayed complications were discussed with the                            patient. Return to normal activities tomorrow.                            Written discharge instructions were provided to the                            patient.                           - Resume previous diet.                           -  Continue present medications.                           - Repeat colonoscopy in 10 years for screening                            purposes.                           - Return to GI clinic in 3 months. Procedure Code(s):        --- Professional ---                           Y8657, Colorectal cancer screening; colonoscopy on                            individual not meeting criteria for high risk Diagnosis Code(s):        --- Professional ---                           Z12.11, Encounter for screening for malignant                            neoplasm of colon                           K57.30, Diverticulosis of large intestine without                            perforation or abscess without bleeding CPT copyright 2022 American Medical Association. All  rights reserved. The codes documented in this report are preliminary and upon coder review may  be revised to meet current compliance requirements. Hennie Duos. Marletta Lor, DO Hennie Duos. Marletta Lor, DO 02/10/2023 9:32:24 AM This report has been signed electronically. Number of Addenda: 0

## 2023-02-10 NOTE — Discharge Instructions (Signed)
EGD Discharge instructions Please read the instructions outlined below and refer to this sheet in the next few weeks. These discharge instructions provide you with general information on caring for yourself after you leave the hospital. Your doctor may also give you specific instructions. While your treatment has been planned according to the most current medical practices available, unavoidable complications occasionally occur. If you have any problems or questions after discharge, please call your doctor. ACTIVITY You may resume your regular activity but move at a slower pace for the next 24 hours.  Take frequent rest periods for the next 24 hours.  Walking will help expel (get rid of) the air and reduce the bloated feeling in your abdomen.  No driving for 24 hours (because of the anesthesia (medicine) used during the test).  You may shower.  Do not sign any important legal documents or operate any machinery for 24 hours (because of the anesthesia used during the test).  NUTRITION Drink plenty of fluids.  You may resume your normal diet.  Begin with a light meal and progress to your normal diet.  Avoid alcoholic beverages for 24 hours or as instructed by your caregiver.  MEDICATIONS You may resume your normal medications unless your caregiver tells you otherwise.  WHAT YOU CAN EXPECT TODAY You may experience abdominal discomfort such as a feeling of fullness or "gas" pains.  FOLLOW-UP Your doctor will discuss the results of your test with you.  SEEK IMMEDIATE MEDICAL ATTENTION IF ANY OF THE FOLLOWING OCCUR: Excessive nausea (feeling sick to your stomach) and/or vomiting.  Severe abdominal pain and distention (swelling).  Trouble swallowing.  Temperature over 101 F (37.8 C).  Rectal bleeding or vomiting of blood.     Colonoscopy Discharge Instructions  Read the instructions outlined below and refer to this sheet in the next few weeks. These discharge instructions provide you with  general information on caring for yourself after you leave the hospital. Your doctor may also give you specific instructions. While your treatment has been planned according to the most current medical practices available, unavoidable complications occasionally occur.   ACTIVITY You may resume your regular activity, but move at a slower pace for the next 24 hours.  Take frequent rest periods for the next 24 hours.  Walking will help get rid of the air and reduce the bloated feeling in your belly (abdomen).  No driving for 24 hours (because of the medicine (anesthesia) used during the test).   Do not sign any important legal documents or operate any machinery for 24 hours (because of the anesthesia used during the test).  NUTRITION Drink plenty of fluids.  You may resume your normal diet as instructed by your doctor.  Begin with a light meal and progress to your normal diet. Heavy or fried foods are harder to digest and may make you feel sick to your stomach (nauseated).  Avoid alcoholic beverages for 24 hours or as instructed.  MEDICATIONS You may resume your normal medications unless your doctor tells you otherwise.  WHAT YOU CAN EXPECT TODAY Some feelings of bloating in the abdomen.  Passage of more gas than usual.  Spotting of blood in your stool or on the toilet paper.  IF YOU HAD POLYPS REMOVED DURING THE COLONOSCOPY: No aspirin products for 7 days or as instructed.  No alcohol for 7 days or as instructed.  Eat a soft diet for the next 24 hours.  FINDING OUT THE RESULTS OF YOUR TEST Not all test results are  available during your visit. If your test results are not back during the visit, make an appointment with your caregiver to find out the results. Do not assume everything is normal if you have not heard from your caregiver or the medical facility. It is important for you to follow up on all of your test results.  SEEK IMMEDIATE MEDICAL ATTENTION IF: You have more than a spotting of  blood in your stool.  Your belly is swollen (abdominal distention).  You are nauseated or vomiting.  You have a temperature over 101.  You have abdominal pain or discomfort that is severe or gets worse throughout the day.   Your EGD revealed mild amount inflammation in your stomach.  I took biopsies of this to rule out infection with a bacteria called H. pylori.  Await pathology results, my office will contact you.  No evidence of Barrett's esophagus.  You do have a small hiatal hernia.  Small bowel appeared normal.  I am going to increase your pantoprazole to 40 mg twice daily for the next 12 weeks.  Your colonoscopy was relatively unremarkable.  I did not find any polyps or evidence of colon cancer.  I recommend repeating colonoscopy in 10 years for colon cancer screening purposes.    You do have diverticulosis. I would recommend increasing fiber in your diet or adding OTC Benefiber/Metamucil. Be sure to drink at least 4 to 6 glasses of water daily. Follow-up with GI in 3 months   I hope you have a great rest of your week!  Hennie Duos. Marletta Lor, D.O. Gastroenterology and Hepatology [facility]

## 2023-02-10 NOTE — H&P (Signed)
Primary Care Physician:  Tommie Sams, DO Primary Gastroenterologist:  Dr. Marletta Lor  Pre-Procedure History & Physical: HPI:  Peter Mahoney is a 62 y.o. male is here for an EGD to be performed for GERD, Barrett's screening and colonoscopy for colon cancer screening.   Past Medical History:  Diagnosis Date   Dental crown present    GERD (gastroesophageal reflux disease)    Post-operative infection 09/21/2011   left leg   PTSD (post-traumatic stress disorder)    Seasonal allergies    Wound infection after surgery, left lateral leg 10/08/2011    Past Surgical History:  Procedure Laterality Date   BACK SURGERY  04/1995   L5-S1   COLONOSCOPY N/A 05/12/2015   Procedure: COLONOSCOPY;  Surgeon: Franky Macho, MD;  Location: [facility];  Service: Gastroenterology;  Laterality: N/A;   INCISION AND DRAINAGE OF WOUND  10/08/2011   Procedure: IRRIGATION AND DEBRIDEMENT WOUND;  Surgeon: Eulas Post, MD;  Location: [facility];  Service: Orthopedics;  Laterality: Left;  left left post op wound   KNEE ARTHROSCOPY  11/2010   left knee - meniscus tear   KNEE SURGERY  08/26/2011   hamstring tendon tear left   NM MYOVIEW LTD  11/17/2012   Exercise 7 minutes. EF 60%. No ischemia or infarction.; Low risk   TONSILLECTOMY     as a child   TRANSTHORACIC ECHOCARDIOGRAM  01/2016   Normal LV function EF 5560%. No regional wall motion abnormality. 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 Mild LA dilation. Otherwise normal    Prior to Admission medications   Medication Sig Start Date End Date Taking? Authorizing Provider  albuterol (VENTOLIN HFA) 108 (90 Base) MCG/ACT inhaler Inhale 2 puffs into the lungs every 6 (six) hours as needed for wheezing or shortness of breath. 09/20/20  Yes Leslye Peer, MD  cyclobenzaprine (FLEXERIL) 10 MG tablet Take 10 mg by mouth daily.   Yes [provider]  diazepam (VALIUM) 10 MG tablet Take 1 tablet  (10 mg total) by mouth at bedtime as needed for anxiety. 12/17/22  Yes Cook, Jayce G, DO  diclofenac Sodium (VOLTAREN) 1 % GEL Apply topically 4 (four) times daily.   Yes [provider]  pantoprazole (PROTONIX) 20 MG tablet Take 40 mg by mouth once. 09/14/21  Yes [provider]  rizatriptan (MAXALT) 10 MG tablet Take 10 mg by mouth as needed. 01/09/22  Yes [provider]  silodosin (RAPAFLO) 8 MG CAPS capsule Take 1 capsule (8 mg total) by mouth at bedtime. 02/03/23  Yes McKenzie, Mardene Celeste, MD  tamsulosin (FLOMAX) 0.4 MG CAPS capsule TAKE 1 CAPSULE BY MOUTH EVERY DAY AFTER SUPPER Patient taking differently: 0.4 mg in the morning and at bedtime. 02/19/22  Yes McKenzie, Mardene Celeste, MD  topiramate (TOPAMAX) 25 MG tablet TAKE ONE TABLET BY MOUTH ONCE A DAY FOR 1 WEEK, THEN TAKE TWO TABLETS EVERY EVENING THEREAFTER 01/09/22  Yes [provider]  sildenafil (VIAGRA) 100 MG tablet Take 1 tablet (100 mg total) by mouth as needed for erectile dysfunction. 02/03/23   Malen Gauze, MD    Allergies as of 01/06/2023 - Review Complete 01/02/2023  Allergen Reaction Noted   Dilaudid [hydromorphone hcl] Other (See Comments) 06/25/2018   Azithromycin Diarrhea 01/02/2023   Doxycycline Nausea And Vomiting 05/19/2015   Oxycodone  07/28/2019    Family History  Problem Relation Age of Onset   Heart Problems Mother  Stroke Mother    Pulmonary fibrosis Mother    Cancer - Lung Father        smoked   Heart failure Maternal Grandmother    Emphysema Maternal Grandfather        "his whole family had Emphysema"   Diabetes Paternal Grandmother    Cancer Paternal Grandfather     Social History   Socioeconomic History   Marital status: Married    Spouse name: Peter Mahoney   Number of children: 2   Years of education: 12   Highest education level: Tax adviser degree: occupational, Scientist, product/process development, or vocational program  Occupational History    Employer: [employer]    Comment: [employer]  Tobacco Use   Smoking status: Former    Current packs/day: 0.00    Average packs/day: 1 pack/day for 36.5 years (36.5 ttl pk-yrs)    Types: Cigarettes    Start date: 72    Quit date: 11/01/2015    Years since quitting: 7.2    Passive exposure: Past   Smokeless tobacco: Never  Vaping Use   Vaping status: Never Used  Substance and Sexual Activity   Alcohol use: Yes    Alcohol/week: 2.0 standard drinks of alcohol    Types: 2 Standard drinks or equivalent per week    Comment: occasionally   Drug use: No   Sexual activity: Not on file  Other Topics Concern   Not on file  Social History Narrative   Married father of 2, Grandfather of 3. Wife's name is Peter Mahoney.   He works as a Passenger transport manager for [employer] 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   Currently smokes a pack a day, and drinks maybe 2-3 alcohol beverages a week.         Epworth Sleepiness Scale      Score: 9      --I seem to be losing my sex drive   --I feel stressed and lack motivation   --I have COPD   --I have been told that I snore   --I am overweight or am gaining weight   --I awake feeling not rested   Social Determinants of Health   Financial Resource Strain: Low Risk  (11/22/2022)   Overall Financial Resource Strain (CARDIA)    Difficulty of Paying Living Expenses: Not hard at all  Food Insecurity: No Food Insecurity (11/22/2022)   Hunger Vital Sign    Worried About Running Out of Food in the Last Year: Never true    Ran Out of Food in the Last Year: Never true  Transportation Needs: No Transportation Needs (11/22/2022)   PRAPARE - Administrator, Civil Service (Medical): No    Lack of Transportation (Non-Medical): No  Physical Activity: Unknown (11/22/2022)   Exercise Vital Sign    Days of Exercise per Week: 0 days    Minutes of Exercise per Session: Not on file  Stress: Stress Concern Present (11/22/2022)   [employer] of Occupational Health - Occupational  Stress Questionnaire    Feeling of Stress : Very much  Social Connections: Moderately Isolated (11/22/2022)   Social Connection and Isolation Panel [NHANES]    Frequency of Communication with Friends and Family: Once a week    Frequency of Social Gatherings with Friends and Family: Once a week    Attends Religious Services: 1 to 4 times per year    Active Member of [employer] or Organizations: No    Attends Banker  Meetings: Not on file    Marital Status: Married  Intimate Partner Violence: Not on file    Review of Systems: General: Negative for fever, chills, fatigue, weakness. Eyes: Negative for vision changes.  ENT: Negative for hoarseness, difficulty swallowing , nasal congestion. CV: Negative for chest pain, angina, palpitations, dyspnea on exertion, peripheral edema.  Respiratory: Negative for dyspnea at rest, dyspnea on exertion, cough, sputum, wheezing.  GI: See history of present illness. GU:  Negative for dysuria, hematuria, urinary incontinence, urinary frequency, nocturnal urination.  MS: Negative for joint pain, low back pain.  Derm: Negative for rash or itching.  Neuro: Negative for weakness, abnormal sensation, seizure, frequent headaches, memory loss, confusion.  Psych: Negative for anxiety, depression Endo: Negative for unusual weight change.  Heme: Negative for bruising or bleeding. Allergy: Negative for rash or hives.  Physical Exam: Vital signs in last 24 hours: Temp:  [97.8 F (36.6 C)] 97.8 F (36.6 C) (10/21 0717) BP: (124)/(79) 124/79 (10/21 0717) SpO2:  [99 %] 99 % (10/21 0717) Weight:  [83.9 kg] 83.9 kg (10/21 0717)   General:   Alert,  Well-developed, well-nourished, pleasant and cooperative in NAD Head:  Normocephalic and atraumatic. Eyes:  Sclera clear, no icterus.   Conjunctiva pink. Ears:  Normal auditory acuity. Nose:  No deformity, discharge,  or lesions. Msk:  Symmetrical without gross deformities. Normal posture. Extremities:   Without clubbing or edema. Neurologic:  Alert and  oriented x4;  grossly normal neurologically. Skin:  Intact without significant lesions or rashes. Psych:  Alert and cooperative. Normal mood and affect.   Impression/Plan: Peter Mahoney is here for an EGD to be performed for GERD, Barrett's screening and colonoscopy for colon cancer screening.   Risks, benefits, limitations, imponderables and alternatives regarding procedure have been reviewed with the patient. Questions have been answered. All parties agreeable.

## 2023-02-10 NOTE — Anesthesia Postprocedure Evaluation (Signed)
Anesthesia Post Note  Patient: MARIANO MELON  Procedure(s) Performed: COLONOSCOPY WITH PROPOFOL ESOPHAGOGASTRODUODENOSCOPY (EGD) WITH PROPOFOL BIOPSY  Patient location during evaluation: Short Stay Anesthesia Type: General Level of consciousness: awake and alert Pain management: pain level controlled Vital Signs Assessment: post-procedure vital signs reviewed and stable Respiratory status: spontaneous breathing Cardiovascular status: blood pressure returned to baseline and stable Postop Assessment: no apparent nausea or vomiting Anesthetic complications: no   No notable events documented.   Last Vitals:  Vitals:   02/10/23 0717  BP: 124/79  Temp: 36.6 C  SpO2: 99%    Last Pain:  Vitals:   02/10/23 0823  TempSrc:   PainSc: 0-No pain                 Mikaiya Tramble

## 2023-02-10 NOTE — Transfer of Care (Signed)
Immediate Anesthesia Transfer of Care Note  Patient: EMMANUAL RAGANS  Procedure(s) Performed: COLONOSCOPY WITH PROPOFOL ESOPHAGOGASTRODUODENOSCOPY (EGD) WITH PROPOFOL BIOPSY  Patient Location: Short Stay  Anesthesia Type:General  Level of Consciousness: awake  Airway & Oxygen Therapy: Patient Spontanous Breathing  Post-op Assessment: Report given to RN  Post vital signs: Reviewed and stable  Last Vitals:  Vitals Value Taken Time  BP    Temp    Pulse 94 02/10/23 0855  Resp 29 02/10/23 0855  SpO2 97 % 02/10/23 0855  Vitals shown include unfiled device data.  Last Pain:  Vitals:   02/10/23 0823  TempSrc:   PainSc: 0-No pain      Patients Stated Pain Goal: 7 (02/10/23 0717)  Complications: No notable events documented.

## 2023-02-10 NOTE — Anesthesia Preprocedure Evaluation (Addendum)
Anesthesia Evaluation  Patient identified by MRN, date of birth, ID band Patient awake    Reviewed: Allergy & Precautions, H&P , NPO status , Patient's Chart, lab work & pertinent test results  History of Anesthesia Complications Negative for: history of anesthetic complications  Airway Mallampati: I  TM Distance: >3 FB Neck ROM: Full    Dental no notable dental hx. (+) Teeth Intact, Dental Advisory Given   Pulmonary former smoker   Pulmonary exam normal breath sounds clear to auscultation       Cardiovascular + CAD  Normal cardiovascular exam Rhythm:Regular Rate:Normal  Non obstructive CAD with calcium score on CT   Neuro/Psych  PSYCHIATRIC DISORDERS Anxiety     PTSDnegative neurological ROS     GI/Hepatic Neg liver ROS,GERD  Medicated and Controlled,,  Endo/Other  negative endocrine ROS    Renal/GU negative Renal ROS     Musculoskeletal   Abdominal   Peds  Hematology negative hematology ROS (+)   Anesthesia Other Findings   Reproductive/Obstetrics                              Anesthesia Physical Anesthesia Plan  ASA: 2  Anesthesia Plan: General   Post-op Pain Management: Minimal or no pain anticipated   Induction: Intravenous  PONV Risk Score and Plan: Propofol infusion  Airway Management Planned: Nasal Cannula and Natural Airway  Additional Equipment:   Intra-op Plan:   Post-operative Plan:   Informed Consent: I have reviewed the patients History and Physical, chart, labs and discussed the procedure including the risks, benefits and alternatives for the proposed anesthesia with the patient or authorized representative who has indicated his/her understanding and acceptance.     Dental advisory given  Plan Discussed with: CRNA  Anesthesia Plan Comments: (Plan routine monitors, GA- LMA OK)        Anesthesia Quick Evaluation

## 2023-02-10 NOTE — Op Note (Signed)
[facility] Patient Name: Peter Mahoney Procedure Date: 02/10/2023 8:18 AM MRN: [id number] Date of Birth: October 11, 1960 Attending MD: Hennie Duos. Marletta Lor , [place], [id number] CSN: [id number] Age: 62 Admit Type: Outpatient Procedure:                Upper GI endoscopy Indications:              Screening for Barrett's esophagus, Heartburn Providers:                Hennie Duos. Marletta Lor, DO, Angelica Ran, Dyann Ruddle Referring MD:              Medicines:                See the Anesthesia note for documentation of the                            administered medications Complications:            No immediate complications. Estimated Blood Loss:     Estimated blood loss was minimal. Procedure:                Pre-Anesthesia Assessment:                           - The anesthesia plan was to use monitored                            anesthesia care (MAC).                           After obtaining informed consent, the endoscope was                            passed under direct vision. Throughout the                            procedure, the patient's blood pressure, pulse, and                            oxygen saturations were monitored continuously. The                            [id number]-H190 ([id number]) scope was introduced through the                            mouth, and advanced to the second part of duodenum.                            The upper GI endoscopy was accomplished without                            difficulty. The patient tolerated the procedure                            well. Scope In: 8:28:18 AM Scope Out: 8:31:42 AM Total Procedure Duration: 0 hours 3 minutes 24 seconds  Findings:      A small hiatal hernia was present.  Patchy mild inflammation characterized by erythema was found in the       gastric body and in the gastric antrum. Biopsies were taken with a cold       forceps for Helicobacter pylori testing.      The duodenal bulb, first portion of the duodenum and second portion  of       the duodenum were normal. Impression:               - Small hiatal hernia.                           - Gastritis. Biopsied.                           - Normal duodenal bulb, first portion of the                            duodenum and second portion of the duodenum. Moderate Sedation:      Per Anesthesia Care Recommendation:           - Patient has a contact number available for                            emergencies. The signs and symptoms of potential                            delayed complications were discussed with the                            patient. Return to normal activities tomorrow.                            Written discharge instructions were provided to the                            patient.                           - Resume previous diet.                           - Continue present medications.                           - Await pathology results.                           - Use Protonix (pantoprazole) 40 mg PO BID for 12                            weeks.                           - Return to GI clinic in 3 months. Procedure Code(s):        --- Professional ---  36644, Esophagogastroduodenoscopy, flexible,                            transoral; with biopsy, single or multiple Diagnosis Code(s):        --- Professional ---                           K44.9, Diaphragmatic hernia without obstruction or                            gangrene                           K29.70, Gastritis, unspecified, without bleeding                           Z13.810, Encounter for screening for upper                            gastrointestinal disorder                           R12, Heartburn CPT copyright 2022 American Medical Association. All rights reserved. The codes documented in this report are preliminary and upon coder review may  be revised to meet current compliance requirements. Hennie Duos. Marletta Lor, DO Hennie Duos. Marletta Lor, DO 02/10/2023 8:34:20  AM This report has been signed electronically. Number of Addenda: 0

## 2023-02-10 NOTE — Anesthesia Postprocedure Evaluation (Signed)
Anesthesia Post Note  Patient: CHISUM WHEELAND  Procedure(s) Performed: COLONOSCOPY WITH PROPOFOL ESOPHAGOGASTRODUODENOSCOPY (EGD) WITH PROPOFOL BIOPSY  Patient location during evaluation: PACU Anesthesia Type: General Level of consciousness: awake and alert Pain management: pain level controlled Vital Signs Assessment: post-procedure vital signs reviewed and stable Respiratory status: spontaneous breathing, nonlabored ventilation, respiratory function stable and patient connected to nasal cannula oxygen Cardiovascular status: blood pressure returned to baseline and stable Postop Assessment: no apparent nausea or vomiting Anesthetic complications: no   There were no known notable events for this encounter.   Last Vitals:  Vitals:   02/10/23 0717 02/10/23 0855  BP: 124/79 (!) 97/44  Pulse:  95  Resp:  18  Temp: 36.6 C (!) 36.2 C  SpO2: 99% 97%    Last Pain:  Vitals:   02/10/23 0855  TempSrc: Oral  PainSc: 0-No pain                 Ayaan Ringle L Aldea Avis

## 2023-02-11 ENCOUNTER — Encounter: Payer: Self-pay | Admitting: Urology

## 2023-02-11 LAB — SURGICAL PATHOLOGY

## 2023-02-11 NOTE — Patient Instructions (Signed)

## 2023-02-14 ENCOUNTER — Other Ambulatory Visit: Payer: Self-pay

## 2023-02-14 ENCOUNTER — Encounter: Payer: Self-pay | Admitting: Urology

## 2023-02-14 MED ORDER — SILODOSIN 8 MG PO CAPS: 8.0000 mg | ORAL_CAPSULE | Freq: Every day | ORAL | 11 refills | Status: AC

## 2023-02-17 ENCOUNTER — Encounter (HOSPITAL_COMMUNITY): Payer: Self-pay | Admitting: Internal Medicine

## 2023-03-23 ENCOUNTER — Other Ambulatory Visit: Payer: Self-pay | Admitting: Family Medicine

## 2023-04-22 ENCOUNTER — Telehealth: Payer: Self-pay | Admitting: Emergency Medicine

## 2023-04-22 DIAGNOSIS — J431 Panlobular emphysema: Secondary | ICD-10-CM

## 2023-04-22 NOTE — Telephone Encounter (Signed)
 Patient sent mychart message to the front desk. He is experiencing increased shortness of breath over the past few months and is requesting a PFT with a visit afterwards to review. There are no active orders for a PFT as Dr Fayetta wanted to see him again in June. Can an order be created so that front can schedule the breathing test? Please route back to front desk once this has been created.

## 2023-04-29 NOTE — Telephone Encounter (Signed)
 Yes, set him up with an OV to see Peter Mahoney, full PFT on the same day

## 2023-04-29 NOTE — Telephone Encounter (Signed)
 Patient is experiencing increased sob over past few months. He was last seen in Sept 2024 and plan was to return in June 2025. Patient would like to have a PFT and be seen sooner. If agreeable please place PFT order.

## 2023-05-01 NOTE — Telephone Encounter (Signed)
 I ordered the PFT   Routing to front desk pool to schedule appts as outlined by RB below, thanks.

## 2023-05-12 ENCOUNTER — Encounter: Payer: Self-pay | Admitting: Internal Medicine

## 2023-05-15 ENCOUNTER — Encounter: Payer: Self-pay | Admitting: Internal Medicine

## 2023-05-15 ENCOUNTER — Telehealth: Payer: Self-pay | Admitting: Emergency Medicine

## 2023-05-15 NOTE — Telephone Encounter (Signed)
Can PT go to [facility] for PFT before his sched appt w/Dr. Delton Coombes in March. He is a Char[employer] fundraiser. His # is [phone]

## 2023-05-15 NOTE — Telephone Encounter (Signed)
Spoke to patient & let him know I had to leave a VM w/ RT to see if they have something sooner at [facility].  Pt verbalized understanding.

## 2023-05-16 NOTE — Telephone Encounter (Signed)
nfn

## 2023-05-19 ENCOUNTER — Ambulatory Visit (HOSPITAL_COMMUNITY)
Admission: RE | Admit: 2023-05-19 | Discharge: 2023-05-19 | Disposition: A | Discharge status: Home / Self Care | Payer: HMO | Source: Ambulatory Visit | Attending: Emergency Medicine | Admitting: Emergency Medicine

## 2023-05-19 DIAGNOSIS — J431 Panlobular emphysema: Secondary | ICD-10-CM | POA: Insufficient documentation

## 2023-05-19 LAB — PULMONARY FUNCTION TEST
DL/VA % pred: 61 %
DL/VA: 2.58 ml/min/mmHg/L
DLCO unc % pred: 55 %
DLCO unc: 15.33 ml/min/mmHg
FEF 25-75 Post: 3.05 L/s
FEF 25-75 Pre: 3.31 L/s
FEF2575-%Change-Post: -8 %
FEF2575-%Pred-Post: 102 %
FEF2575-%Pred-Pre: 111 %
FEV1-%Change-Post: -1 %
FEV1-%Pred-Post: 99 %
FEV1-%Pred-Pre: 101 %
FEV1-Post: 3.59 L
FEV1-Pre: 3.66 L
FEV1FVC-%Change-Post: 0 %
FEV1FVC-%Pred-Pre: 102 %
FEV6-%Change-Post: -1 %
FEV6-%Pred-Post: 100 %
FEV6-%Pred-Pre: 101 %
FEV6-Post: 4.58 L
FEV6-Pre: 4.64 L
FEV6FVC-%Change-Post: 0 %
FEV6FVC-%Pred-Post: 103 %
FEV6FVC-%Pred-Pre: 103 %
FVC-%Change-Post: -1 %
FVC-%Pred-Post: 97 %
FVC-%Pred-Pre: 98 %
FVC-Post: 4.63 L
FVC-Pre: 4.7 L
Post FEV1/FVC ratio: 77 %
Post FEV6/FVC ratio: 99 %
Pre FEV1/FVC ratio: 78 %
Pre FEV6/FVC Ratio: 99 %
RV % pred: 83 %
RV: 1.87 L
TLC % pred: 92 %
TLC: 6.49 L

## 2023-05-19 MED ORDER — ALBUTEROL SULFATE (2.5 MG/3ML) 0.083% IN NEBU
2.5000 mg | INHALATION_SOLUTION | Freq: Once | RESPIRATORY_TRACT | Status: AC
  Administered 2023-05-19: 2.5 mg via RESPIRATORY_TRACT

## 2023-06-03 ENCOUNTER — Ambulatory Visit (INDEPENDENT_AMBULATORY_CARE_PROVIDER_SITE_OTHER): Payer: HMO | Admitting: Gastroenterology

## 2023-06-03 ENCOUNTER — Encounter: Payer: Self-pay | Admitting: Gastroenterology

## 2023-06-03 VITALS — BP 133/79 | HR 74 | Temp 98.6°F | Ht 70.0 in | Wt 190.8 lb

## 2023-06-03 DIAGNOSIS — K219 Gastro-esophageal reflux disease without esophagitis: Secondary | ICD-10-CM

## 2023-06-03 DIAGNOSIS — R1013 Epigastric pain: Secondary | ICD-10-CM

## 2023-06-03 DIAGNOSIS — K2970 Gastritis, unspecified, without bleeding: Secondary | ICD-10-CM | POA: Insufficient documentation

## 2023-06-03 HISTORY — DX: Epigastric pain: R10.13

## 2023-06-03 MED ORDER — FAMOTIDINE 40 MG PO TABS
40.0000 mg | ORAL_TABLET | Freq: Every day | ORAL | 1 refills | Status: DC
Start: 2023-06-03 — End: 2023-06-26

## 2023-06-03 NOTE — Progress Notes (Signed)
GI Office Note    Referring Provider: Tommie Sams, DO Primary Care Physician:  Tommie Sams, DO  Primary Gastroenterologist: Hennie Duos. Marletta Lor, DO   Chief Complaint   Chief Complaint  Patient presents with   Follow-up    Follow up on colonoscopy and endoscopy    History of Present Illness   Peter Mahoney is a 63 y.o. male presenting today for follow up.  Last seen in August 2024 for chronic GERD with breakthrough symptoms.    Due to longstanding history of acid reflux for more than 30 years, patient did undergo endoscopy to screen for Barrett's esophagus as well as assess ongoing symptoms.  Also completing colon cancer screening.  At time of endoscopy was noted to have gastritis, biopsy with no significant pathology.  No H. pylori.  Pantoprazole was increased to twice daily for 12 weeks.    Patient states that he was doing fine until early January and he started noticing some epigastric burning.  This was a new symptom for him.  He decided to decrease his pantoprazole wondering if he was getting too much medication.  After a couple days of taking once daily dosing, his abdominal pain resolved.  Last week he developed upper respiratory infection, possibly the flu.  Had fever, cough/congestion, not able to eat for several days and lost about 5 pounds.  He noted recurrent epigastric burning, sometimes a sensation of feeling like a brick is sitting in his stomach.  Typical heartburn overall well-controlled.  Bowel movements regular using fiber in his coffee daily.  No melena or rectal bleeding.  Denies any oral NSAID use.  Colonoscopy 01/2023: -diverticulosis -repeat colonoscopy in 10 years  EGD 01/2023: -small hh -gastritis s/p bx no significant pathology. No h.pylori   Medications   Current Outpatient Medications  Medication Sig Dispense Refill   cyclobenzaprine (FLEXERIL) 10 MG tablet Take 10 mg by mouth daily.     diazepam (VALIUM) 10 MG tablet TAKE 1 TABLET (10 MG  TOTAL) BY MOUTH AT BEDTIME AS NEEDED FOR ANXIETY 90 tablet 0   diclofenac Sodium (VOLTAREN) 1 % GEL Apply topically 4 (four) times daily.     pantoprazole (PROTONIX) 40 MG tablet Take 1 tablet (40 mg total) by mouth 2 (two) times daily. 60 tablet 11   rizatriptan (MAXALT) 10 MG tablet Take 10 mg by mouth as needed.     sildenafil (VIAGRA) 100 MG tablet Take 1 tablet (100 mg total) by mouth as needed for erectile dysfunction. 30 tablet 4   tamsulosin (FLOMAX) 0.4 MG CAPS capsule TAKE 1 CAPSULE BY MOUTH EVERY DAY AFTER SUPPER (Patient taking differently: 0.4 mg in the morning and at bedtime.) 90 capsule 3   topiramate (TOPAMAX) 25 MG tablet TAKE ONE TABLET BY MOUTH ONCE A DAY FOR 1 WEEK, THEN TAKE TWO TABLETS EVERY EVENING THEREAFTER     albuterol (VENTOLIN HFA) 108 (90 Base) MCG/ACT inhaler Inhale 2 puffs into the lungs every 6 (six) hours as needed for wheezing or shortness of breath. (Patient not taking: Reported on 06/03/2023) 18 g 5   silodosin (RAPAFLO) 8 MG CAPS capsule Take 1 capsule (8 mg total) by mouth at bedtime. (Patient not taking: Reported on 06/03/2023) 30 capsule 11   No current facility-administered medications for this visit.    Allergies   Allergies as of 06/03/2023 - Review Complete 06/03/2023  Allergen Reaction Noted   Dilaudid [hydromorphone hcl] Other (See Comments) 06/25/2018   Azithromycin Diarrhea 01/02/2023  Doxycycline Nausea And Vomiting 05/19/2015   Oxycodone  07/28/2019    Review of Systems   General: Negative for anorexia, weight loss, fever, chills, fatigue, weakness.  See HPI ENT: Negative for hoarseness, difficulty swallowing , nasal congestion. CV: Negative for chest pain, angina, palpitations, dyspnea on exertion, peripheral edema.  Respiratory: Negative for dyspnea at rest, dyspnea on exertion, cough, sputum, wheezing.  GI: See history of present illness. GU:  Negative for dysuria, hematuria, urinary incontinence, urinary frequency, nocturnal  urination.  Endo: Negative for unusual weight change.     Physical Exam   BP 133/79   Pulse 74   Temp 98.6 F (37 C)   Ht 5\' 10"  (1.778 m)   Wt 190 lb 12.8 oz (86.5 kg)   BMI 27.38 kg/m    General: Well-nourished, well-developed in no acute distress.  Eyes: No icterus. Mouth: Oropharyngeal mucosa moist and pink  Abdomen: Bowel sounds are normal, nontender, nondistended, no hepatosplenomegaly or masses,  no abdominal bruits or hernia , no rebound or guarding.  Mild rectus diastases. Rectal: Not performed Extremities: No lower extremity edema. No clubbing or deformities. Neuro: Alert and oriented x 4   Skin: Warm and dry, no jaundice.   Psych: Alert and cooperative, normal mood and affect.  Labs   None available Imaging Studies   No results found.  Assessment/Plan    GERD: well controlled -continue pantoprazole 40 mg daily before breakfast -Reinforced antireflux measures -Return office visit in 1 year  Gastritis: At time of EGD, he did not have any abdominal pain, incidental finding -Treated with twice daily PPI for 12 weeks. -No oral NSAIDs  Epigastric pain: -Noted about 4 weeks ago, improved with decreasing pantoprazole to once daily, but now with recurrent symptoms after recent illness last week -Symptoms improve with eating -Will add famotidine 40 mg at bedtime for the next several weeks -If persistent abdominal pain, would recommend further evaluation ie labs and imaging -Patient to reach out with progress report in 2 weeks -Handout regarding gastritis provided   Leanna Battles. Melvyn Neth, MHS, PA-C [facility]

## 2023-06-03 NOTE — Patient Instructions (Addendum)
Continue pantoprazole 40 mg daily before breakfast. Start famotidine 40 mg at bedtime.  Prescription sent to pharmacy. Monitor symptoms over the next couple of weeks, please reach out to me by MyChart message or telephone and let me know if you are having persistent upper abdominal discomfort.  If so, at that time would consider further evaluation.

## 2023-06-06 ENCOUNTER — Ambulatory Visit (INDEPENDENT_AMBULATORY_CARE_PROVIDER_SITE_OTHER): Payer: PPO

## 2023-06-06 VITALS — Ht 70.0 in | Wt 184.0 lb

## 2023-06-06 DIAGNOSIS — F17210 Nicotine dependence, cigarettes, uncomplicated: Secondary | ICD-10-CM | POA: Diagnosis not present

## 2023-06-06 DIAGNOSIS — Z1159 Encounter for screening for other viral diseases: Secondary | ICD-10-CM

## 2023-06-06 DIAGNOSIS — Z0000 Encounter for general adult medical examination without abnormal findings: Secondary | ICD-10-CM

## 2023-06-06 DIAGNOSIS — Z0001 Encounter for general adult medical examination with abnormal findings: Secondary | ICD-10-CM

## 2023-06-06 DIAGNOSIS — Z114 Encounter for screening for human immunodeficiency virus [HIV]: Secondary | ICD-10-CM

## 2023-06-06 NOTE — Progress Notes (Signed)
Please attest and cosign this visit due to patients primary care provider not being in the office at the time the visit was completed.   Because this visit was a virtual/telehealth visit,  certain criteria was not obtained, such a blood pressure, CBG if applicable, and timed get up and go. Any medications not marked as "taking" were not mentioned during the medication reconciliation part of the visit. Any vitals not documented were not able to be obtained due to this being a telehealth visit or patient was unable to self-report a recent blood pressure reading due to a lack of equipment at home via telehealth. Vitals that have been documented are verbally provided by the patient.  Interactive audio and video telecommunications were attempted between this provider and patient, however failed, due to patient having technical difficulties OR patient did not have access to video capability.  We continued and completed visit with audio only.  Subjective:   Peter Mahoney is a 63 y.o. male who presents for Medicare Annual/Subsequent preventive examination.  Visit Complete: Virtual I connected with  Peter Mahoney on 06/06/23 by a audio enabled telemedicine application and verified that I am speaking with the correct person using two identifiers.  Patient Location: Home  Provider Location: Home Office  I discussed the limitations of evaluation and management by telemedicine. The patient expressed understanding and agreed to proceed.  Vital Signs: Because this visit was a virtual/telehealth visit, some criteria may be missing or patient reported. Any vitals not documented were not able to be obtained and vitals that have been documented are patient reported.  Patient Medicare AWV questionnaire was completed by the patient on 06/05/2023; I have confirmed that all information answered by patient is correct and no changes since this date.  Cardiac Risk Factors include: advanced age (>34men, >38 women);male  gender;sedentary lifestyle     Objective:    Today's Vitals   06/06/23 1414  Weight: 184 lb (83.5 kg)  Height: 5\' 10"  (1.778 m)   Body mass index is 26.4 kg/m.     06/06/2023    2:17 PM 06/19/2021    8:51 PM 05/28/2019   11:48 AM 12/31/2015    8:30 PM 05/12/2015    6:37 AM 10/07/2011    4:17 PM  Advanced Directives  Does Patient Have a Medical Advance Directive? No No No No No Patient does not have advance directive;Patient would not like information  Would patient like information on creating a medical advance directive? No - Patient declined No - Patient declined No - Patient declined No - patient declined information No - patient declined information     Current Medications (verified) Outpatient Encounter Medications as of 06/06/2023  Medication Sig   cyclobenzaprine (FLEXERIL) 10 MG tablet Take 10 mg by mouth daily.   diazepam (VALIUM) 10 MG tablet TAKE 1 TABLET (10 MG TOTAL) BY MOUTH AT BEDTIME AS NEEDED FOR ANXIETY   diclofenac Sodium (VOLTAREN) 1 % GEL Apply topically 4 (four) times daily.   famotidine (PEPCID) 40 MG tablet Take 1 tablet (40 mg total) by mouth at bedtime.   pantoprazole (PROTONIX) 40 MG tablet Take 1 tablet (40 mg total) by mouth daily before breakfast.   rizatriptan (MAXALT) 10 MG tablet Take 10 mg by mouth as needed.   sildenafil (VIAGRA) 100 MG tablet Take 1 tablet (100 mg total) by mouth as needed for erectile dysfunction.   tamsulosin (FLOMAX) 0.4 MG CAPS capsule TAKE 1 CAPSULE BY MOUTH EVERY DAY AFTER SUPPER (Patient taking differently:  0.4 mg in the morning and at bedtime.)   topiramate (TOPAMAX) 100 MG tablet Take 100 mg by mouth at bedtime.   albuterol (VENTOLIN HFA) 108 (90 Base) MCG/ACT inhaler Inhale 2 puffs into the lungs every 6 (six) hours as needed for wheezing or shortness of breath. (Patient not taking: Reported on 06/06/2023)   silodosin (RAPAFLO) 8 MG CAPS capsule Take 1 capsule (8 mg total) by mouth at bedtime. (Patient not taking: Reported  on 06/06/2023)   [DISCONTINUED] topiramate (TOPAMAX) 25 MG tablet TAKE ONE TABLET BY MOUTH ONCE A DAY FOR 1 WEEK, THEN TAKE TWO TABLETS EVERY EVENING THEREAFTER   No facility-administered encounter medications on file as of 06/06/2023.    Allergies (verified) Dilaudid [hydromorphone hcl], Azithromycin, Doxycycline, and Oxycodone   History: Past Medical History:  Diagnosis Date   Dental crown present    GERD (gastroesophageal reflux disease)    Post-operative infection 09/21/2011   left leg   PTSD (post-traumatic stress disorder)    Seasonal allergies    Wound infection after surgery, left lateral leg 10/08/2011   Past Surgical History:  Procedure Laterality Date   BACK SURGERY  04/1995   L5-S1   BIOPSY  02/10/2023   Procedure: BIOPSY;  Surgeon: Lanelle Bal, DO;  [facility]: [facility];  Service: Endoscopy;;   COLONOSCOPY N/A 05/12/2015   Procedure: COLONOSCOPY;  Surgeon: Franky Macho, MD;  [facility]: [facility];  Service: Gastroenterology;  Laterality: N/A;   COLONOSCOPY WITH PROPOFOL N/A 02/10/2023   Procedure: COLONOSCOPY WITH PROPOFOL;  Surgeon: Lanelle Bal, DO;  [facility]: [facility];  Service: Endoscopy;  Laterality: N/A;  8:15 AM, ASA 3   ESOPHAGOGASTRODUODENOSCOPY (EGD) WITH PROPOFOL N/A 02/10/2023   Procedure: ESOPHAGOGASTRODUODENOSCOPY (EGD) WITH PROPOFOL;  Surgeon: Lanelle Bal, DO;  Location: [facility];  Service: Endoscopy;  Laterality: N/A;   INCISION AND DRAINAGE OF WOUND  10/08/2011   Procedure: IRRIGATION AND DEBRIDEMENT WOUND;  Surgeon: Eulas Post, MD;  Location: [facility];  Service: Orthopedics;  Laterality: Left;  left left post op wound   KNEE ARTHROSCOPY  11/2010   left knee - meniscus tear   KNEE SURGERY  08/26/2011   hamstring tendon tear left   NM MYOVIEW LTD  11/17/2012   Exercise 7 minutes. EF 60%. No ischemia or infarction.; Low risk   TONSILLECTOMY     as a child   TRANSTHORACIC ECHOCARDIOGRAM  01/2016    Normal LV function EF 5560%. No regional wall motion abnormality. 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 Mild LA dilation. Otherwise normal   Family History  Problem Relation Age of Onset   Heart Problems Mother    Stroke Mother    Pulmonary fibrosis Mother    Cancer - Lung Father        smoked   Heart failure Maternal Grandmother    Emphysema Maternal Grandfather        "his whole family had Emphysema"   Diabetes Paternal Grandmother    Cancer Paternal Grandfather    Social History   Socioeconomic History   Marital status: Married    Spouse name: [name]   Number of children: 2   Years of education: 12   Highest education level: Tax adviser degree: occupational, Scientist, product/process development, or vocational program  Occupational History    Employer: [employer]    Comment: [employer]  Tobacco Use   Smoking status: Former    Current packs/day: 0.00  Average packs/day: 1 pack/day for 36.5 years (36.5 ttl pk-yrs)    Types: Cigarettes    Start date: 25    Quit date: 11/01/2015    Years since quitting: 7.6    Passive exposure: Past   Smokeless tobacco: Never  Vaping Use   Vaping status: Never Used  Substance and Sexual Activity   Alcohol use: Yes    Alcohol/week: 2.0 standard drinks of alcohol    Types: 2 Standard drinks or equivalent per week    Comment: occasionally   Drug use: No   Sexual activity: Not on file  Other Topics Concern   Not on file  Social History Narrative   Married father of 2, Grandfather of 3. Wife's name is [name].   He works as a Passenger transport manager for [employer] 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   Currently smokes a pack a day, and drinks maybe 2-3 alcohol beverages a week.         Epworth Sleepiness Scale      Score: 9      --I seem to be losing my sex drive   --I feel stressed and lack motivation   --I have COPD   --I have been told that I snore   --I am overweight or am gaining weight    --I awake feeling not rested   Social Drivers of Health   Financial Resource Strain: Low Risk  (06/05/2023)   Overall Financial Resource Strain (CARDIA)    Difficulty of Paying Living Expenses: Not hard at all  Food Insecurity: No Food Insecurity (06/05/2023)   Hunger Vital Sign    Worried About Running Out of Food in the Last Year: Never true    Ran Out of Food in the Last Year: Never true  Transportation Needs: No Transportation Needs (06/05/2023)   PRAPARE - Administrator, Civil Service (Medical): No    Lack of Transportation (Non-Medical): No  Physical Activity: Inactive (06/05/2023)   Exercise Vital Sign    Days of Exercise per Week: 0 days    Minutes of Exercise per Session: 0 min  Stress: Stress Concern Present (06/05/2023)   [employer] of Occupational Health - Occupational Stress Questionnaire    Feeling of Stress : Rather much  Social Connections: Unknown (06/05/2023)   Social Connection and Isolation Panel [NHANES]    Frequency of Communication with Friends and Family: Once a week    Frequency of Social Gatherings with Friends and Family: Patient declined    Attends Religious Services: More than 4 times per year    Active Member of [employer] or Organizations: Patient declined    Attends Engineer, structural: Patient declined    Marital Status: Married    Tobacco Counseling Counseling given: Yes   Clinical Intake:  Pre-visit preparation completed: Yes  Pain : No/denies pain     BMI - recorded: 26.4 Nutritional Status: BMI 25 -29 Overweight Nutritional Risks: None Diabetes: No  How often do you need to have someone help you when you read instructions, pamphlets, or other written materials from your doctor or pharmacy?: 1 - Never  Interpreter Needed?: No  Information entered by :: Maryjean Ka CMA   Activities of Daily Living    06/06/2023    2:15 PM  In your present state of health, do you have any difficulty performing the following  activities:  Hearing? 0  Vision? 0  Difficulty concentrating or making decisions? 0  Walking or climbing  stairs? 0  Dressing or bathing? 0  Doing errands, shopping? 0  Preparing Food and eating ? N  Using the Toilet? N  In the past six months, have you accidently leaked urine? N  Do you have problems with loss of bowel control? N  Managing your Medications? N  Managing your Finances? N  Housekeeping or managing your Housekeeping? N    Patient Care Team: Tommie Sams, DO as PCP - General (Family Medicine) Mallipeddi, Orion Modest, MD as PCP - Cardiology (Cardiology)  Indicate any recent Medical Services you may have received from other than [facility] providers in the past year (date may be approximate).     Assessment:   This is a routine wellness examination for Jennings.  Hearing/Vision screen Hearing Screening - Comments:: Patient denies any hearing difficulties.   Vision Screening - Comments:: Wears rx glasses - up to date with routine eye exams  Patient sees Dr. Daisy Lazar w/ [facility] office.     Goals Addressed             This Visit's Progress    Patient Stated       Lose weight       Depression Screen    06/06/2023    2:25 PM 11/26/2022   11:03 AM 04/08/2022    2:00 PM 10/11/2019    3:02 PM 05/28/2019   11:46 AM  PHQ 2/9 Scores  PHQ - 2 Score 0 5 4 0 0  PHQ- 9 Score  16 12 5 5     Fall Risk    06/06/2023    2:17 PM 11/26/2022   10:50 AM 04/08/2022    2:00 PM  Fall Risk   Falls in the past year? 0 0 0  Number falls in past yr: 0  0  Injury with Fall? 0  0  Risk for fall due to : No Fall Risks    Follow up Falls prevention discussed;Falls evaluation completed      MEDICARE RISK AT HOME: Medicare Risk at Home Any stairs in or around the home?: Yes If so, are there any without handrails?: No Home free of loose throw rugs in walkways, pet beds, electrical cords, etc?: Yes Adequate lighting in your home to reduce risk of falls?: Yes Life  alert?: No Use of a cane, walker or w/c?: No Grab bars in the bathroom?: No Shower chair or bench in shower?: Yes Elevated toilet seat or a handicapped toilet?: Yes  TIMED UP AND GO:  Was the test performed?  No    Cognitive Function:        06/06/2023    2:18 PM  6CIT Screen  What Year? 0 points  What month? 0 points  What time? 0 points  Count back from 20 0 points  Months in reverse 0 points  Repeat phrase 0 points  Total Score 0 points    Immunizations Immunization History  Administered Date(s) Administered   Influenza-Unspecified 01/05/2019, 01/06/2020, 01/31/2021   PFIZER(Purple Top)SARS-COV-2 Vaccination 08/13/2019, 09/07/2019, 03/23/2020    TDAP status: Due, Education has been provided regarding the importance of this vaccine. Advised may receive this vaccine at local pharmacy or Health Dept. Aware to provide a copy of the vaccination record if obtained from local pharmacy or Health Dept. Verbalized acceptance and understanding.  Flu Vaccine status: Due, Education has been provided regarding the importance of this vaccine. Advised may receive this vaccine at local pharmacy or Health Dept. Aware to provide a copy of  the vaccination record if obtained from local pharmacy or Health Dept. Verbalized acceptance and understanding.  Pneumococcal vaccine status: Due, Education has been provided regarding the importance of this vaccine. Advised may receive this vaccine at local pharmacy or Health Dept. Aware to provide a copy of the vaccination record if obtained from local pharmacy or Health Dept. Verbalized acceptance and understanding.  Covid-19 vaccine status: Information provided on how to obtain vaccines.   Qualifies for Shingles Vaccine? Yes   Zostavax completed No   Shingrix Completed?: No.    Education has been provided regarding the importance of this vaccine. Patient has been advised to call insurance company to determine out of pocket expense if they have not yet  received this vaccine. Advised may also receive vaccine at local pharmacy or Health Dept. Verbalized acceptance and understanding.  Screening Tests Health Maintenance  Topic Date Due   Medicare Annual Wellness (AWV)  Never done   Pneumococcal Vaccine 60-71 Years old (1 of 2 - PCV) Never done   HIV Screening  Never done   Hepatitis C Screening  Never done   DTaP/Tdap/Td (1 - Tdap) Never done   Zoster Vaccines- Shingrix (1 of 2) Never done   COVID-19 Vaccine (4 - 2024-25 season) 12/22/2022   INFLUENZA VACCINE  07/21/2023 (Originally 11/21/2022)   Lung Cancer Screening  10/04/2023   Colonoscopy  02/09/2033   HPV VACCINES  Aged Out    Health Maintenance  Health Maintenance Due  Topic Date Due   Medicare Annual Wellness (AWV)  Never done   Pneumococcal Vaccine 41-51 Years old (1 of 2 - PCV) Never done   HIV Screening  Never done   Hepatitis C Screening  Never done   DTaP/Tdap/Td (1 - Tdap) Never done   Zoster Vaccines- Shingrix (1 of 2) Never done   COVID-19 Vaccine (4 - 2024-25 season) 12/22/2022    Colorectal cancer screening: Type of screening: Colonoscopy. Completed 02/10/2023. Repeat every 10 years  Lung Cancer Screening: (Low Dose CT Chest recommended if Age 82-80 years, 20 pack-year currently smoking OR have quit w/in 15years.) does qualify.   Lung Cancer Screening Referral: Screening order entered 06/06/2023  Additional Screening:  Hepatitis C Screening: does qualify; Ordered 06/06/2023  Vision Screening: Recommended annual ophthalmology exams for early detection of glaucoma and other disorders of the eye. Is the patient up to date with their annual eye exam?  Yes  Who is the provider or what is the name of the office in which the patient attends annual eye exams? Patient sees Dr. Daisy Lazar w/ [facility] office.   Dental Screening: Recommended annual dental exams for proper oral hygiene  Diabetic Foot Exam: na  Community Resource Referral / Chronic Care  Management: CRR required this visit?  No   CCM required this visit?  No     Plan:     I have personally reviewed and noted the following in the patient's chart:   Medical and social history Use of alcohol, tobacco or illicit drugs  Current medications and supplements including opioid prescriptions. Patient is not currently taking opioid prescriptions. Functional ability and status Nutritional status Physical activity Advanced directives List of other physicians Hospitalizations, surgeries, and ER visits in previous 12 months Vitals Screenings to include cognitive, depression, and falls Referrals and appointments  In addition, I have reviewed and discussed with patient certain preventive protocols, quality metrics, and best practice recommendations. A written personalized care plan for preventive services as well as general preventive health  recommendations were provided to patient.     Jordan Hawks Michalla Ringer, CMA   06/06/2023   After Visit Summary: (MyChart) Due to this being a telephonic visit, the after visit summary with patients personalized plan was offered to patient via MyChart

## 2023-06-06 NOTE — Patient Instructions (Signed)
[name] , Thank you for taking time to come for your Medicare Wellness Visit. I appreciate your ongoing commitment to your health goals. Please review the following plan we discussed and let me know if I can assist you in the future.   Referrals/Orders/Follow-Ups/Clinician Recommendations:  Next Medicare Annual Wellness Visit:  June 11, 2024 at 2:30pm  A Hepatitis C Screening has been ordered for you today. You do not have to fast to have this lab drawn.   A lung cancer screening has been placed for you today. Please call the number below to schedule that for when it's convenient for your schedule.    [x] Lung Cancer Screening  [facility] [address] [place], [place] [address] [phone]  Make sure to wear two-piece clothing.  No lotions powders or deodorants the day of the appointment Make sure to bring picture ID and insurance card.  Bring list of medications you are currently taking including any supplements.    This is a list of the screening recommended for you and due dates:  Health Maintenance  Topic Date Due   Pneumococcal Vaccination (1 of 2 - PCV) Never done   Hepatitis C Screening  Never done   DTaP/Tdap/Td vaccine (1 - Tdap) Never done   Zoster (Shingles) Vaccine (1 of 2) Never done   COVID-19 Vaccine (4 - 2024-25 season) 12/22/2022   Flu Shot  07/21/2023*   Screening for Lung Cancer  10/04/2023   Medicare Annual Wellness Visit  06/05/2024   Colon Cancer Screening  02/09/2033   HIV Screening  Completed   HPV Vaccine  Aged Out  *Topic was postponed. The date shown is not the original due date.    Advanced directives: (Declined) Advance directive discussed with you today. Even though you declined this today, please call our office should you change your mind, and we can give you the proper paperwork for you to fill out.  Next Medicare Annual Wellness Visit scheduled for next year: yes  Preventive Care 40-64 Years  Old, Male Preventive care refers to lifestyle choices and visits with your health care provider that can promote health and wellness. Preventive care visits are also called wellness exams. What can I expect for my preventive care visit? Counseling During your preventive care visit, your health care provider may ask about your: Medical history, including: Past medical problems. Family medical history. Current health, including: Emotional well-being. Home life and relationship well-being. Sexual activity. Lifestyle, including: Alcohol, nicotine or tobacco, and drug use. Access to firearms. Diet, exercise, and sleep habits. Safety issues such as seatbelt and bike helmet use. Sunscreen use. Work and work Astronomer. Physical exam Your health care provider will check your: Height and weight. These may be used to calculate your BMI (body mass index). BMI is a measurement that tells if you are at a healthy weight. Waist circumference. This measures the distance around your waistline. This measurement also tells if you are at a healthy weight and may help predict your risk of certain diseases, such as type 2 diabetes and high blood pressure. Heart rate and blood pressure. Body temperature. Skin for abnormal spots. What immunizations do I need?  Vaccines are usually given at various ages, according to a schedule. Your health care provider will recommend vaccines for you based on your age, medical history, and lifestyle or other factors, such as travel or where you work. What tests do I need? Screening Your health care provider may recommend screening tests  for certain conditions. This may include: Lipid and cholesterol levels. Diabetes screening. This is done by checking your blood sugar (glucose) after you have not eaten for a while (fasting). Hepatitis B test. Hepatitis C test. HIV (human immunodeficiency virus) test. STI (sexually transmitted infection) testing, if you are at  risk. Lung cancer screening. Prostate cancer screening. Colorectal cancer screening. Talk with your health care provider about your test results, treatment options, and if necessary, the need for more tests. Follow these instructions at home: Eating and drinking  Eat a diet that includes fresh fruits and vegetables, whole grains, lean protein, and low-fat dairy products. Take vitamin and mineral supplements as recommended by your health care provider. Do not drink alcohol if your health care provider tells you not to drink. If you drink alcohol: Limit how much you have to 0-2 drinks a day. Know how much alcohol is in your drink. In the [place] one drink equals one 12 oz bottle of beer (355 mL), one 5 oz glass of wine (148 mL), or one 1 oz glass of hard liquor (44 mL). Lifestyle Brush your teeth every morning and night with fluoride toothpaste. Floss one time each day. Exercise for at least 30 minutes 5 or more days each week. Do not use any products that contain nicotine or tobacco. These products include cigarettes, chewing tobacco, and vaping devices, such as e-cigarettes. If you need help quitting, ask your health care provider. Do not use drugs. If you are sexually active, practice safe sex. Use a condom or other form of protection to prevent STIs. Take aspirin only as told by your health care provider. Make sure that you understand how much to take and what form to take. Work with your health care provider to find out whether it is safe and beneficial for you to take aspirin daily. Find healthy ways to manage stress, such as: Meditation, yoga, or listening to music. Journaling. Talking to a trusted person. Spending time with friends and family. Minimize exposure to UV radiation to reduce your risk of skin cancer. Safety Always wear your seat belt while driving or riding in a vehicle. Do not drive: If you have been drinking alcohol. Do not ride with someone who has been  drinking. When you are tired or distracted. While texting. If you have been using any mind-altering substances or drugs. Wear a helmet and other protective equipment during sports activities. If you have firearms in your house, make sure you follow all gun safety procedures. What's next? Go to your health care provider once a year for an annual wellness visit. Ask your health care provider how often you should have your eyes and teeth checked. Stay up to date on all vaccines. This information is not intended to replace advice given to you by your health care provider. Make sure you discuss any questions you have with your health care provider. Document Revised: 10/04/2020 Document Reviewed: 10/04/2020 Elsevier Patient Education  2024 [employer].  Understanding Your Risk for Falls Millions of people have serious injuries from falls each year. It is important to understand your risk of falling. Talk with your health care provider about your risk and what you can do to lower it. If you do have a serious fall, make sure to tell your provider. Falling once raises your risk of falling again. How can falls affect me? Serious injuries from falls are common. These include: Broken bones, such as hip fractures. Head injuries, such as traumatic brain injuries (TBI) or concussions.  A fear of falling can cause you to avoid activities and stay at home. This can make your muscles weaker and raise your risk for a fall. What can increase my risk? There are a number of risk factors that increase your risk for falling. The more risk factors you have, the higher your risk of falling. Serious injuries from a fall happen most often to people who are older than 61 years old. Teenagers and young adults ages 58-29 are also at higher risk. Common risk factors include: Weakness in the lower body. Being generally weak or confused due to long-term (chronic) illness. Dizziness or balance problems. Poor  vision. Medicines that cause dizziness or drowsiness. These may include: Medicines for your blood pressure, heart, anxiety, insomnia, or swelling (edema). Pain medicines. Muscle relaxants. Other risk factors include: Drinking alcohol. Having had a fall in the past. Having foot pain or wearing improper footwear. Working at a dangerous job. Having any of the following in your home: Tripping hazards, such as floor clutter or loose rugs. Poor lighting. Pets. Having dementia or memory loss. What actions can I take to lower my risk of falling?     Physical activity Stay physically fit. Do strength and balance exercises. Consider taking a regular class to build strength and balance. Yoga and tai chi are good options. Vision Have your eyes checked every year and your prescription for glasses or contacts updated as needed. Shoes and walking aids Wear non-skid shoes. Wear shoes that have rubber soles and low heels. Do not wear high heels. Do not walk around the house in socks or slippers. Use a cane or walker as told by your provider. Home safety Attach secure railings on both sides of your stairs. Install grab bars for your bathtub, shower, and toilet. Use a non-skid mat in your bathtub or shower. Attach bath mats securely with double-sided, non-slip rug tape. Use good lighting in all rooms. Keep a flashlight near your bed. Make sure there is a clear path from your bed to the bathroom. Use night-lights. Do not use throw rugs. Make sure all carpeting is taped or tacked down securely. Remove all clutter from walkways and stairways, including extension cords. Repair uneven or broken steps and floors. Avoid walking on icy or slippery surfaces. Walk on the grass instead of on icy or slick sidewalks. Use ice melter to get rid of ice on walkways in the winter. Use a cordless phone. Questions to ask your health care provider Can you help me check my risk for a fall? Do any of my medicines  make me more likely to fall? Should I take a vitamin D supplement? What exercises can I do to improve my strength and balance? Should I make an appointment to have my vision checked? Do I need a bone density test to check for weak bones (osteoporosis)? Would it help to use a cane or a walker? Where to find more information Centers for Disease Control and Prevention, STEADI: TonerPromos.no Community-Based Fall Prevention Programs: TonerPromos.no [employer] on Aging: BaseRingTones.pl Contact a health care provider if: You fall at home. You are afraid of falling at home. You feel weak, drowsy, or dizzy. This information is not intended to replace advice given to you by your health care provider. Make sure you discuss any questions you have with your health care provider. Document Revised: 12/10/2021 Document Reviewed: 12/10/2021 Elsevier Patient Education  2024 [employer].

## 2023-06-10 ENCOUNTER — Other Ambulatory Visit: Payer: Self-pay | Admitting: Nurse Practitioner

## 2023-06-11 ENCOUNTER — Other Ambulatory Visit: Payer: Self-pay | Admitting: Nurse Practitioner

## 2023-06-11 DIAGNOSIS — Z122 Encounter for screening for malignant neoplasm of respiratory organs: Secondary | ICD-10-CM

## 2023-06-12 ENCOUNTER — Ambulatory Visit: Payer: HMO | Admitting: Emergency Medicine

## 2023-06-12 ENCOUNTER — Encounter: Payer: Self-pay | Admitting: Emergency Medicine

## 2023-06-12 VITALS — BP 113/71 | HR 74 | Ht 70.0 in | Wt 192.2 lb

## 2023-06-12 DIAGNOSIS — J431 Panlobular emphysema: Secondary | ICD-10-CM | POA: Diagnosis not present

## 2023-06-12 DIAGNOSIS — Z87891 Personal history of nicotine dependence: Secondary | ICD-10-CM

## 2023-06-12 DIAGNOSIS — K219 Gastro-esophageal reflux disease without esophagitis: Secondary | ICD-10-CM

## 2023-06-12 DIAGNOSIS — R0609 Other forms of dyspnea: Secondary | ICD-10-CM | POA: Diagnosis not present

## 2023-06-12 MED ORDER — CEFUROXIME AXETIL 250 MG PO TABS: 250.0000 mg | ORAL_TABLET | Freq: Two times a day (BID) | ORAL | 0 refills | Status: DC

## 2023-06-12 MED ORDER — PREDNISONE 20 MG PO TABS: 20.0000 mg | ORAL_TABLET | Freq: Every day | ORAL | 0 refills | Status: DC

## 2023-06-12 NOTE — Progress Notes (Signed)
 Subjective:    Patient ID: Peter Mahoney, male    DOB: Jul 30, 1960, 63 y.o.   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  He is had dyspnea at a proportion to his pulmonary function testing.  Not currently on scheduled BD therapy.  He has not desaturated in the past.  Today he reports that his breathing is about the same. Worse in the cool weather. Minimal benefit from his albuterol. He can get SOB walking through the house.   He has participated in lung cancer screening program, last CT chest 10/04/2022 which was reassuring, RADS 2 study.  It did confirm his centrilobular and paraseptal emphysema, scattered nodules that were stable largest 9.3 mm   ROV 06/12/2023 --follow-up visit for [facility].  He is 14 with a history of emphysematous COPD and associated exertional dyspnea.  He participates in lung cancer screening program (due for repeat in June).  He is not currently on any long-acting BD therapy because he has tried these and really has not gotten significant benefit. He had COVID in 01/2023, and viral sx improved but he has persistent SOB w exertion.  He developed URI symptoms about 2 weeks ago and has had persistent cough, exertional and resting shortness of breath. He still has cough with clear mucous production. Has chest tightness.  He is waking up in the middle of the night with dyspnea which is new - feels unable to get a good deep breath in. He does snore. He hasn't had any benefit from albuterol w this illness. He is not having any overt GERD but he had gastritis on his EGD and has been on PPI bid, now every day + famotidine at bedtime.   Pulmonary function testing 05/19/2023 reviewed by me shows overall normal airflows with an FEV1 of 3.66 L (101% predicted).  There is no bronchodilator response.  Lung volumes are normal.  Diffusion capacity is decreased.  When you look and see I looked but I could not find  Alpha-1 antitrypsin  level 161 on 05/30/2017  PSG 06/19/21 >> AHI was 0.   Review of Systems As above  Past Medical History:  Diagnosis Date   Dental crown present    GERD (gastroesophageal reflux disease)    Post-operative infection 09/21/2011   left leg   PTSD (post-traumatic stress disorder)    Seasonal allergies    Wound infection after surgery, left lateral leg 10/08/2011     Family History  Problem Relation Age of Onset   Heart Problems Mother    Stroke Mother    Pulmonary fibrosis Mother    Cancer - Lung Father        smoked   Heart failure Maternal Grandmother    Emphysema Maternal Grandfather        "his whole family had Emphysema"   Diabetes Paternal Grandmother    Cancer Paternal Grandfather      Social History   Socioeconomic History   Marital status: Married    Spouse name: [name]   Number of children: 2   Years of education: 12   Highest education level: Tax adviser degree: occupational, Scientist, product/process development, or vocational program  Occupational History    Employer: [employer]    Comment: [employer]  Tobacco Use   Smoking status: Former    Current packs/day: 0.00    Average packs/day: 1 pack/day for 36.5 years (36.5 ttl pk-yrs)    Types: Cigarettes    Start  date: 50    Quit date: 11/01/2015    Years since quitting: 7.6    Passive exposure: Past   Smokeless tobacco: Never  Vaping Use   Vaping status: Never Used  Substance and Sexual Activity   Alcohol use: Yes    Alcohol/week: 2.0 standard drinks of alcohol    Types: 2 Standard drinks or equivalent per week    Comment: occasionally   Drug use: No   Sexual activity: Not on file  Other Topics Concern   Not on file  Social History Narrative   Married father of 2, Grandfather of 3. Wife's name is [name].   He works as a Passenger transport manager for [employer] 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   Currently smokes a pack a day, and drinks maybe 2-3 alcohol beverages a week.         Epworth Sleepiness Scale       Score: 9      --I seem to be losing my sex drive   --I feel stressed and lack motivation   --I have COPD   --I have been told that I snore   --I am overweight or am gaining weight   --I awake feeling not rested   Social Drivers of Health   Financial Resource Strain: Low Risk  (06/05/2023)   Overall Financial Resource Strain (CARDIA)    Difficulty of Paying Living Expenses: Not hard at all  Food Insecurity: No Food Insecurity (06/05/2023)   Hunger Vital Sign    Worried About Running Out of Food in the Last Year: Never true    Ran Out of Food in the Last Year: Never true  Transportation Needs: No Transportation Needs (06/05/2023)   PRAPARE - Administrator, Civil Service (Medical): No    Lack of Transportation (Non-Medical): No  Physical Activity: Inactive (06/05/2023)   Exercise Vital Sign    Days of Exercise per Week: 0 days    Minutes of Exercise per Session: 0 min  Stress: Stress Concern Present (06/05/2023)   [employer] of Occupational Health - Occupational Stress Questionnaire    Feeling of Stress : Rather much  Social Connections: Unknown (06/05/2023)   Social Connection and Isolation Panel [NHANES]    Frequency of Communication with Friends and Family: Once a week    Frequency of Social Gatherings with Friends and Family: Patient declined    Attends Religious Services: More than 4 times per year    Active Member of [employer] or Organizations: Patient declined    Attends Banker Meetings: Patient declined    Marital Status: Married  Catering manager Violence: Not At Risk (06/06/2023)   Humiliation, Afraid, Rape, and Kick questionnaire    Fear of Current or Ex-Partner: No    Emotionally Abused: No    Physically Abused: No    Sexually Abused: No  [employer]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     diazepam (VALIUM) 10 MG tablet TAKE 1 TABLET (10 MG TOTAL) BY MOUTH AT BEDTIME AS NEEDED FOR ANXIETY 90 tablet 0   diclofenac Sodium (VOLTAREN) 1 %  GEL Apply topically 4 (four) times daily.     famotidine (PEPCID) 40 MG tablet Take 1 tablet (40 mg total) by mouth at bedtime. 30 tablet 1   pantoprazole (PROTONIX) 40 MG tablet Take 1 tablet (40 mg total) by mouth daily before breakfast.     rizatriptan (MAXALT) 10 MG tablet Take 10 mg by mouth as needed.     sildenafil (VIAGRA) 100 MG tablet Take 1 tablet (100 mg total) by mouth as needed for erectile dysfunction. 30 tablet 4   tamsulosin (FLOMAX) 0.4 MG CAPS capsule TAKE 1 CAPSULE BY MOUTH EVERY DAY AFTER SUPPER (Patient taking differently: 0.4 mg in the morning and at bedtime.) 90 capsule 3   topiramate (TOPAMAX) 100 MG tablet Take 100 mg by mouth at bedtime.     albuterol (VENTOLIN HFA) 108 (90 Base) MCG/ACT inhaler Inhale 2 puffs into the lungs every 6 (six) hours as needed for wheezing or shortness of breath. (Patient not taking: Reported on 06/12/2023) 18 g 5   silodosin (RAPAFLO) 8 MG CAPS capsule Take 1 capsule (8 mg total) by mouth at bedtime. (Patient not taking: Reported on 06/03/2023) 30 capsule 11   No facility-administered medications prior to visit.         Objective:   Physical Exam Vitals:   06/12/23 1114  BP: 113/71  Pulse: 74  SpO2: 94%  Weight: 192 lb 3.2 oz (87.2 kg)  Height: 5\' 10"  (1.778 m)    Gen: Pleasant, well-nourished, in no distress,  normal affect  ENT: No lesions,  mouth clear,  oropharynx clear, no postnasal drip  Neck: No JVD, no stridor  Lungs: No use of accessory muscles, distant, no crackles or wheezing on normal respiration, no wheezing on forced expiration  Cardiovascular: RRR, heart sounds normal, no  murmur or gallops, no peripheral edema  Musculoskeletal: No deformities, no cyanosis or clubbing  Neuro: alert, awake, non focal  Skin: Warm, no lesions or rash       Assessment & Plan:  Dyspnea on exertion [name] continues to have shortness of breath with exertion.  It became more problematic after he had COVID-19 in 01/2023.  His pulmonary function testing remains reassuring although he does have a diffusion defect.  We will repeat his walking oximetry today to rule out occult hypoxemia.  He is also been noticing nocturnal wake ups.  He is treated for GERD because he had gastritis on EGD, does not feel any overt reflux.  A PSG in 2023 did not show any evidence of OSA.  Can consider working to treat his emphysema/COPD although he has not significantly benefited in the past and his PFT are unchanged.  He has repeat CT chest to look for any evidence of ILD, progression of his emphysema in June 2025.  Question whether he may need to revisit any other possible contributor including cardiac causes.  We can consider this if his shortness of breath continues following efforts to improve his overall conditioning and stamina.  COPD with emphysema (HCC) This sounds like a possible acute flare following a URI with bronchitic/asthmatic symptoms.  I will treat him with cefuroxime and prednisone to see if he gets benefit.  We have discussed possibly being on scheduled BD but we have tried it before and he has not benefited.  His pulmonary function testing is unchanged.  Continue the albuterol as needed for now  GERD (gastroesophageal reflux disease) Continue current PPI and famotidine at night  History of tobacco abuse Due for repeat lung cancer  screening CT in mid June   Time spent 43 minutes   Levy Pupa, MD, PhD 06/12/2023, 11:50 AM [facility] [phone] or if no answer [phone]

## 2023-06-12 NOTE — Assessment & Plan Note (Signed)
 Continue current PPI and famotidine at night

## 2023-06-12 NOTE — Assessment & Plan Note (Signed)
 This sounds like a possible acute flare following a URI with bronchitic/asthmatic symptoms.  I will treat him with cefuroxime and prednisone to see if he gets benefit.  We have discussed possibly being on scheduled BD but we have tried it before and he has not benefited.  His pulmonary function testing is unchanged.  Continue the albuterol as needed for now

## 2023-06-12 NOTE — Assessment & Plan Note (Signed)
 Due for repeat lung cancer screening CT in mid June

## 2023-06-12 NOTE — Assessment & Plan Note (Addendum)
 [name] continues to have shortness of breath with exertion.  It became more problematic after he had COVID-19 in 01/2023.  His pulmonary function testing remains reassuring although he does have a diffusion defect.  We will repeat his walking oximetry today to rule out occult hypoxemia.  He is also been noticing nocturnal wake ups.  He is treated for GERD because he had gastritis on EGD, does not feel any overt reflux.  A PSG in 2023 did not show any evidence of OSA.  Can consider working to treat his emphysema/COPD although he has not significantly benefited in the past and his PFT are unchanged.  He has repeat CT chest to look for any evidence of ILD, progression of his emphysema in June 2025.  Question whether he may need to revisit any other possible contributor including cardiac causes.  We can consider this if his shortness of breath continues following efforts to improve his overall conditioning and stamina.

## 2023-06-12 NOTE — Patient Instructions (Addendum)
 We reviewed your testing today including your pulmonary function testing from January.  This was a reassuring test. We reviewed your polysomnogram from 2023 which did not show any evolving obstructive sleep apnea. You are due for a repeat lung cancer screening CT scan of the chest in June 2025. We will perform a walking oximetry on room air today Please take prednisone 20 mg once daily for 5 days and then stop Please take cefuroxime as directed until completely gone. Keep albuterol available to use 2 puffs if needed for shortness of breath, chest tightness, wheezing, spells of coughing. We could consider retrying a scheduled inhaler medication at some point in the future. Would recommend working on your exercise and conditioning to see if this helps your functional capacity and overall breathing. Agree with your pantoprazole and famotidine as you have been taking them Follow Dr. Delton Coombes in June so we can review your lung cancer screening CT.  Please call sooner if you have any problems.

## 2023-06-24 ENCOUNTER — Ambulatory Visit: Admitting: Internal Medicine

## 2023-06-24 ENCOUNTER — Encounter: Payer: Self-pay | Admitting: Emergency Medicine

## 2023-06-24 ENCOUNTER — Ambulatory Visit (INDEPENDENT_AMBULATORY_CARE_PROVIDER_SITE_OTHER)

## 2023-06-24 VITALS — BP 110/68 | HR 99 | Ht 70.0 in | Wt 191.0 lb

## 2023-06-24 DIAGNOSIS — R0609 Other forms of dyspnea: Secondary | ICD-10-CM

## 2023-06-24 DIAGNOSIS — J431 Panlobular emphysema: Secondary | ICD-10-CM

## 2023-06-24 DIAGNOSIS — R059 Cough, unspecified: Secondary | ICD-10-CM | POA: Diagnosis not present

## 2023-06-24 DIAGNOSIS — R918 Other nonspecific abnormal finding of lung field: Secondary | ICD-10-CM | POA: Diagnosis not present

## 2023-06-24 DIAGNOSIS — Z87891 Personal history of nicotine dependence: Secondary | ICD-10-CM

## 2023-06-24 MED ORDER — METHYLPREDNISOLONE ACETATE 80 MG/ML IJ SUSP
120.0000 mg | Freq: Once | INTRAMUSCULAR | Status: AC
  Administered 2023-06-24: 120 mg via INTRAMUSCULAR

## 2023-06-24 MED ORDER — TRAMADOL HCL 50 MG PO TABS: 50.0000 mg | ORAL_TABLET | ORAL | 0 refills | Status: AC | PRN

## 2023-06-24 NOTE — Telephone Encounter (Signed)
 Called and spoke w/ patient on the phone made him an appt to be seen today , pt stated that he having some wheezing , and coughing with phlegm , pt was on antibiotic x 5 days and prednisone. No fever , have runny nose some drainage down his throat.

## 2023-06-24 NOTE — Patient Instructions (Signed)
 Breztri Take 30- 60 min before your first and last meals of the day   Depomedrol 120 mg IM   Take delsym two tsp every 12 hours and supplement if needed with  tramadol 50 mg up to 1 every 4 hours to suppress the urge to cough. Swallowing water and/or using ice chips/non mint and menthol containing candies (such as lifesavers or sugarless jolly ranchers) are also effective.  You should rest your voice and avoid activities that you know make you cough.  Once you have eliminated the cough for 3 straight days try reducing the tramadol first,  then the delsym as tolerated.

## 2023-06-24 NOTE — Progress Notes (Unsigned)
 TABARI VOLKERT, male    DOB: December 27, 1960   MRN: [id number]   Brief patient profile:  62 yowm   quit smoking 10/2015  [place] Patient self referred to pulmonary clinic 06/24/2023  for cough      PFTs 05/19/23  with nl airflow, DLCO 15.33 (55%)    LDSCT  10/04/22   Centrilobular and paraseptal emphysema. / no ild   History of Present Illness  06/24/2023  Pulmonary/ Acute  office eval/Isaiyah Feldhaus on no Maint rx cc cough x Feb 3rd  2025 wife same illness already rx ceftin/ pred s improvement  Chief Complaint  Patient presents with   Acute Visit    having some wheezing , and coughing with phlegm , pt was on antibiotic x 5 days and prednisone didn't help  Dyspnea:  worse since onset of cough  Cough: clear now / assoc some nasal drainage mostly clear mucus  Sleep: 2 pillows bed is flat / cough keeps him  SABA use: no better  02 BJY:NWGN     No obvious day to day or daytime pattern/variability or assoc purulent sputum or mucus plugs or hemoptysis or cp or chest tightness,   or overt sinus or hb symptoms.   Also denies any obvious fluctuation of symptoms with weather or environmental changes or other aggravating or alleviating factors except as outlined above   No unusual exposure hx or h/o childhood pna/ asthma or knowledge of premature birth.  Current Allergies, Complete Past Medical History, Past Surgical History, Family History, and Social History were reviewed in [employer] record.  ROS  The following are not active complaints unless bolded Hoarseness, sore throat, dysphagia, dental problems, itching, sneezing,  nasal congestion or discharge of excess mucus or purulent secretions, ear ache,   fever, chills, sweats, unintended wt loss or wt gain, classically pleuritic or exertional cp,  orthopnea pnd or arm/hand swelling  or leg swelling, presyncope, palpitations, abdominal pain, anorexia, nausea, vomiting, diarrhea  or change in bowel habits or change in bladder habits, change in  stools or change in urine, dysuria, hematuria,  rash, arthralgias, visual complaints, headache, numbness, weakness or ataxia or problems with walking or coordination,  change in mood or  memory.             Outpatient Medications Prior to Visit  Medication Sig Dispense Refill   albuterol (VENTOLIN HFA) 108 (90 Base) MCG/ACT inhaler Inhale 2 puffs into the lungs every 6 (six) hours as needed for wheezing or shortness of breath. 18 g 5   cefUROXime (CEFTIN) 250 MG tablet Take 1 tablet (250 mg total) by mouth 2 (two) times daily with a meal. 10 tablet 0   cyclobenzaprine (FLEXERIL) 10 MG tablet Take 10 mg by mouth daily.     diazepam (VALIUM) 10 MG tablet TAKE 1 TABLET (10 MG TOTAL) BY MOUTH AT BEDTIME AS NEEDED FOR ANXIETY 90 tablet 0   diclofenac Sodium (VOLTAREN) 1 % GEL Apply topically 4 (four) times daily.     famotidine (PEPCID) 40 MG tablet Take 1 tablet (40 mg total) by mouth at bedtime. 30 tablet 1   pantoprazole (PROTONIX) 40 MG tablet Take 1 tablet (40 mg total) by mouth daily before breakfast.     predniSONE (DELTASONE) 20 MG tablet Take 1 tablet (20 mg total) by mouth daily with breakfast. 5 tablet 0   rizatriptan (MAXALT) 10 MG tablet Take 10 mg by mouth as needed.     sildenafil (VIAGRA) 100 MG tablet Take  1 tablet (100 mg total) by mouth as needed for erectile dysfunction. 30 tablet 4   silodosin (RAPAFLO) 8 MG CAPS capsule Take 1 capsule (8 mg total) by mouth at bedtime. 30 capsule 11   tamsulosin (FLOMAX) 0.4 MG CAPS capsule TAKE 1 CAPSULE BY MOUTH EVERY DAY AFTER SUPPER (Patient taking differently: 0.4 mg in the morning and at bedtime.) 90 capsule 3   topiramate (TOPAMAX) 100 MG tablet Take 100 mg by mouth at bedtime.     No facility-administered medications prior to visit.    Past Medical History:  Diagnosis Date   Dental crown present    GERD (gastroesophageal reflux disease)    Post-operative infection 09/21/2011   left leg   PTSD (post-traumatic stress disorder)     Seasonal allergies    Wound infection after surgery, left lateral leg 10/08/2011      Objective:     BP 110/68   Pulse 99   Ht 5\' 10"  (1.778 m)   Wt 191 lb (86.6 kg)   SpO2 99%   BMI 27.41 kg/m   SpO2: 99 % pleasant amb wm nad  HEENT : Oropharynx  clear   Nasal turbinates nl    NECK :  without  apparent JVD/ palpable Nodes/TM    LUNGS: no acc muscle use,  Min barrel  contour chest wall with bilateral  Distant pan exp wheeze and  without cough on insp or exp maneuvers  and mild  Hyperresonant  to  percussion bilaterally     CV:  RRR  no s3 or murmur or increase in P2, and no edema   ABD:  soft and nontender with pos end  insp Hoover's  in the supine position.  No bruits or organomegaly appreciated   MS:  Nl gait/ ext warm without deformities Or obvious joint restrictions  calf tenderness, cyanosis or clubbing     SKIN: warm and dry without lesions    NEURO:  alert, approp, nl sensorium with  no motor or cerebellar deficits apparent.     CXR PA and Lateral:   06/24/2023 :    I personally reviewed images and agree with radiology impression as follows:    Minimal ill-defined opacity in the right mid lung may represent pneumonia in the setting of cough     Assessment   Dyspnea on exertion Quit smoking 2027  -PFTs 05/19/23  with nl airflow, DLCO 15.33 (55%)   -LDSCT  10/04/22   Centrilobular and paraseptal emphysema. / no ild   >>>  06/24/2023 wheeze on exam 06/24/2023 rec trial of breztri sample x one week and doxycyline for vague infiltrate R with f/u 2 weeks   COPD  0/ AB superimposed on emphysema is likely the cause of most of his syptoms and he may get significant benefit, at least is short term, from breztri trial but he appears unwilling to commit to maint rx (at least in past) when he doesn't feel they are helping but has not tried the [place] yet   - The proper method of use, as well as anticipated side effects, of a metered-dose inhaler were discussed and  demonstrated to the patient using teach back method. Improved effectiveness after extensive coaching during this visit to a level of approximately 80 % from a baseline of 50 %  hfa  technique         Each maintenance medication was reviewed in detail including emphasizing most importantly the difference between maintenance and prns and under what circumstances the  prns are to be triggered using an action plan format where appropriate.  Total time for H and P, chart review, counseling, reviewing hfa  device(s) and generating customized AVS unique to this office visit / same day charting = 31 min with pt new to me               Sandrea Hughs, MD 06/24/2023

## 2023-06-25 ENCOUNTER — Encounter: Payer: Self-pay | Admitting: Internal Medicine

## 2023-06-25 MED ORDER — DOXYCYCLINE HYCLATE 100 MG PO TABS
100.0000 mg | ORAL_TABLET | Freq: Two times a day (BID) | ORAL | 0 refills | Status: AC
Start: 2023-06-25 — End: 2023-07-05

## 2023-06-25 NOTE — Assessment & Plan Note (Addendum)
 Quit smoking 2027  -PFTs 05/19/23  with nl airflow, DLCO 15.33 (55%)   -LDSCT  10/04/22   Centrilobular and paraseptal emphysema. / no ild   >>>  06/24/2023 wheeze on exam 06/24/2023 rec trial of breztri sample x one week and doxycyline for vague infiltrate R with f/u 2 weeks   COPD  0/ AB superimposed on emphysema is likely the cause of most of his syptoms and he may get significant benefit, at least is short term, from breztri trial but he appears unwilling to commit to maint rx (at least in past) when he doesn't feel they are helping but has not tried the [place] yet   - The proper method of use, as well as anticipated side effects, of a metered-dose inhaler were discussed and demonstrated to the patient using teach back method. Improved effectiveness after extensive coaching during this visit to a level of approximately 80 % from a baseline of 50 %  hfa  technique

## 2023-06-25 NOTE — Assessment & Plan Note (Addendum)
 See DOE  Will add tramadol 50 mg prn to delsym 2 tsp bid to control coughing fits if breztri not helpful for these as well as.  Depomedrol 120 mg IM   Doxy for ? R sided infiltrate  F/u 2 weeks       Each maintenance medication was reviewed in detail including emphasizing most importantly the difference between maintenance and prns and under what circumstances the prns are to be triggered using an action plan format where appropriate.  Total time for H and P, chart review, counseling, reviewing hfa  device(s) and generating customized AVS unique to this office visit / same day charting = 31 min with pt new to me

## 2023-06-26 ENCOUNTER — Other Ambulatory Visit: Payer: Self-pay | Admitting: Gastroenterology

## 2023-07-04 ENCOUNTER — Ambulatory Visit: Payer: PPO | Admitting: Emergency Medicine

## 2023-07-08 ENCOUNTER — Encounter: Payer: Self-pay | Admitting: Family Medicine

## 2023-07-08 ENCOUNTER — Ambulatory Visit: Payer: PPO | Admitting: Family Medicine

## 2023-07-08 VITALS — BP 118/74 | HR 62 | Temp 98.4°F | Wt 187.4 lb

## 2023-07-08 DIAGNOSIS — R7989 Other specified abnormal findings of blood chemistry: Secondary | ICD-10-CM

## 2023-07-08 DIAGNOSIS — E7800 Pure hypercholesterolemia, unspecified: Secondary | ICD-10-CM

## 2023-07-08 DIAGNOSIS — Z0000 Encounter for general adult medical examination without abnormal findings: Secondary | ICD-10-CM

## 2023-07-08 DIAGNOSIS — Z125 Encounter for screening for malignant neoplasm of prostate: Secondary | ICD-10-CM | POA: Diagnosis not present

## 2023-07-08 DIAGNOSIS — Z0001 Encounter for general adult medical examination with abnormal findings: Secondary | ICD-10-CM | POA: Diagnosis not present

## 2023-07-08 NOTE — Patient Instructions (Signed)
 Labs at your convenience.  Follow up in 6 months.  Keep an eye on the skin lesion.  Take care  Dr. Adriana Simas

## 2023-07-09 DIAGNOSIS — Z125 Encounter for screening for malignant neoplasm of prostate: Secondary | ICD-10-CM | POA: Diagnosis not present

## 2023-07-09 DIAGNOSIS — R7989 Other specified abnormal findings of blood chemistry: Secondary | ICD-10-CM | POA: Diagnosis not present

## 2023-07-09 DIAGNOSIS — E7800 Pure hypercholesterolemia, unspecified: Secondary | ICD-10-CM | POA: Diagnosis not present

## 2023-07-09 DIAGNOSIS — Z0000 Encounter for general adult medical examination without abnormal findings: Secondary | ICD-10-CM | POA: Insufficient documentation

## 2023-07-09 MED ORDER — DIAZEPAM 10 MG PO TABS: 10.0000 mg | ORAL_TABLET | Freq: Every evening | ORAL | 0 refills | Status: DC | PRN

## 2023-07-09 NOTE — Assessment & Plan Note (Signed)
 Overall doing fairly well.  Has had recent respiratory infection.  Improving.  Has follow-up with pulmonology.  Per guideline recommendations, if he has received a pneumococcal 23 he should receive an additional vaccination (will reach out to the patient to recommend).  The remainder of his preventative health care is up-to-date excluding shingles vaccine which he can get at his pharmacy.  He was informed of this.  Labs today.

## 2023-07-09 NOTE — Progress Notes (Signed)
 Subjective:  Patient ID: Peter Mahoney, male    DOB: Jun 24, 1960  Age: 63 y.o. MRN: [id number]  CC:  Physical   HPI:  63 year old male presents for a physical.  Patient recently had Medicare wellness visit on 2/14.  He is here for a physical exam today. Patient states that he has had a prior pneumococcal vaccination.  I have no record of this.   Tetanus up-to-date.  Patient needs routine labs today.  Patient follows with pulmonology regarding COPD.  He is doing okay at this time.  Has had recent infection and seems to be slowly improving.  He is no longer smoking.  Patient Active Problem List   Diagnosis Date Noted   Annual physical exam 07/09/2023   Gastritis and gastroduodenitis 06/03/2023   History of tobacco abuse 01/02/2023   CAD (coronary artery disease) 04/08/2022   PTSD (post-traumatic stress disorder) 04/08/2022   Migraine 04/08/2022   Insomnia 04/08/2022   COPD with emphysema (HCC) 07/28/2019   Erectile dysfunction 01/26/2016   Dyspnea on exertion 11/12/2012   GERD (gastroesophageal reflux disease) 10/14/2011    Review of Systems  Constitutional:  Negative for fever.  Respiratory:  Positive for cough.     Objective:  BP 118/74   Pulse 62   Temp 98.4 F (36.9 C)   Wt 187 lb 6.4 oz (85 kg)   SpO2 98%   BMI 26.89 kg/m      07/08/2023    2:26 PM 06/24/2023    3:45 PM 06/12/2023   11:14 AM  BP/Weight  Systolic BP 118 110 113  Diastolic BP 74 68 71  Wt. (Lbs) 187.4 191 192.2  BMI 26.89 kg/m2 27.41 kg/m2 27.58 kg/m2    Physical Exam Vitals and nursing note reviewed.  Constitutional:      General: He is not in acute distress.    Appearance: Normal appearance.  HENT:     Head: Normocephalic and atraumatic.  Eyes:     General:        Right eye: No discharge.        Left eye: No discharge.     Conjunctiva/sclera: Conjunctivae normal.  Cardiovascular:     Rate and Rhythm: Normal rate and regular rhythm.  Pulmonary:     Effort: Pulmonary effort is  normal.     Breath sounds: Normal breath sounds. No wheezing, rhonchi or rales.  Neurological:     Mental Status: He is alert.  Psychiatric:        Mood and Affect: Mood normal.        Behavior: Behavior normal.     Lab Results  Component Value Date   WBC 6.9 04/09/2022   HGB 16.6 04/09/2022   HCT 49.4 04/09/2022   PLT 209 04/09/2022   GLUCOSE 94 04/09/2022   CHOL 206 (H) 04/09/2022   TRIG 80 04/09/2022   HDL 63 04/09/2022   LDLCALC 129 (H) 04/09/2022   ALT 26 04/09/2022   AST 24 04/09/2022   NA 140 04/09/2022   K 4.9 04/09/2022   CL 101 04/09/2022   CREATININE 1.29 (H) 04/09/2022   BUN 13 04/09/2022   CO2 25 04/09/2022     Assessment & Plan:  Annual physical exam Assessment & Plan: Overall doing fairly well.  Has had recent respiratory infection.  Improving.  Has follow-up with pulmonology.  Per guideline recommendations, if he has received a pneumococcal 23 he should receive an additional vaccination (will reach out to the patient to recommend).  The remainder  of his preventative health care is up-to-date excluding shingles vaccine which he can get at his pharmacy.  He was informed of this.  Labs today.   Pure hypercholesterolemia -     Lipid panel  Elevated serum creatinine -     CBC -     CMP14+EGFR  Screening PSA (prostate specific antigen) -     PSA  Other orders -     diazePAM; Take 1 tablet (10 mg total) by mouth at bedtime as needed for anxiety.  Dispense: 90 tablet; Refill: 0    Follow-up:  6 months  Gail Creekmore Adriana Simas DO [facility]

## 2023-07-10 LAB — CBC
Hematocrit: 46.3 % (ref 37.5–51.0)
Hemoglobin: 15.2 g/dL (ref 13.0–17.7)
MCH: 32.1 pg (ref 26.6–33.0)
MCHC: 32.8 g/dL (ref 31.5–35.7)
MCV: 98 fL — ABNORMAL HIGH (ref 79–97)
Platelets: 243 10*3/uL (ref 150–450)
RBC: 4.74 x10E6/uL (ref 4.14–5.80)
RDW: 12.6 % (ref 11.6–15.4)
WBC: 7.7 10*3/uL (ref 3.4–10.8)

## 2023-07-10 LAB — LIPID PANEL
Chol/HDL Ratio: 3.2 ratio (ref 0.0–5.0)
Cholesterol, Total: 187 mg/dL (ref 100–199)
HDL: 58 mg/dL (ref >39)
LDL Chol Calc (NIH): 112 mg/dL — ABNORMAL HIGH (ref 0–99)
Triglycerides: 96 mg/dL (ref 0–149)
VLDL Cholesterol Cal: 17 mg/dL (ref 5–40)

## 2023-07-10 LAB — CMP14+EGFR
ALT: 15 IU/L (ref 0–44)
AST: 16 IU/L (ref 0–40)
Albumin: 4.4 g/dL (ref 3.8–4.9)
Alkaline Phosphatase: 83 IU/L (ref 44–121)
BUN/Creatinine Ratio: 9 — ABNORMAL LOW (ref 10–24)
BUN: 11 mg/dL (ref 8–27)
Bilirubin Total: 0.5 mg/dL (ref 0.0–1.2)
CO2: 25 mmol/L (ref 20–29)
Calcium: 9.3 mg/dL (ref 8.6–10.2)
Chloride: 102 mmol/L (ref 96–106)
Creatinine, Ser: 1.2 mg/dL (ref 0.76–1.27)
Globulin, Total: 2.1 g/dL (ref 1.5–4.5)
Glucose: 97 mg/dL (ref 70–99)
Potassium: 4.9 mmol/L (ref 3.5–5.2)
Sodium: 140 mmol/L (ref 134–144)
Total Protein: 6.5 g/dL (ref 6.0–8.5)
eGFR: 69 mL/min/{1.73_m2} (ref >59)

## 2023-07-10 LAB — PSA: Prostate Specific Ag, Serum: 1.2 ng/mL (ref 0.0–4.0)

## 2023-07-11 ENCOUNTER — Ambulatory Visit: Admitting: Emergency Medicine

## 2023-07-11 ENCOUNTER — Ambulatory Visit

## 2023-07-11 ENCOUNTER — Encounter: Payer: Self-pay | Admitting: Emergency Medicine

## 2023-07-11 VITALS — BP 125/82 | HR 70 | Ht 70.0 in | Wt 190.4 lb

## 2023-07-11 DIAGNOSIS — R918 Other nonspecific abnormal finding of lung field: Secondary | ICD-10-CM | POA: Diagnosis not present

## 2023-07-11 DIAGNOSIS — J189 Pneumonia, unspecified organism: Secondary | ICD-10-CM | POA: Diagnosis not present

## 2023-07-11 DIAGNOSIS — Z87891 Personal history of nicotine dependence: Secondary | ICD-10-CM

## 2023-07-11 DIAGNOSIS — J449 Chronic obstructive pulmonary disease, unspecified: Secondary | ICD-10-CM

## 2023-07-11 DIAGNOSIS — J301 Allergic rhinitis due to pollen: Secondary | ICD-10-CM | POA: Diagnosis not present

## 2023-07-11 DIAGNOSIS — K219 Gastro-esophageal reflux disease without esophagitis: Secondary | ICD-10-CM

## 2023-07-11 DIAGNOSIS — J431 Panlobular emphysema: Secondary | ICD-10-CM

## 2023-07-11 MED ORDER — BREZTRI AEROSPHERE 160-9-4.8 MCG/ACT IN AERO: 2.0000 | INHALATION_SPRAY | Freq: Two times a day (BID) | RESPIRATORY_TRACT | 6 refills | Status: DC

## 2023-07-11 NOTE — Patient Instructions (Addendum)
 We will check a chest x-ray today We will try starting Breztri 2 puffs twice a day.  Rinse and gargle after you use it. Keep albuterol available to use 2 puffs up to every 4 hours if needed for shortness of breath, chest tightness, wheezing.  Continue your pantoprazole 40 mg twice a day.  Try to take 1 hour around food. Try starting loratadine 10 mg (generic Claritin) daily through the allergy season Get your lung cancer screening CT chest in June as planned Follow Dr. Delton Coombes in June to review your status and your CT chest

## 2023-07-11 NOTE — Progress Notes (Signed)
 Subjective:    Patient ID: Peter Mahoney, male    DOB: Apr 21, 1961, 63 y.o.   MRN: [id number]  HPI  ROV 01/02/2023 --Peter Mahoney is 40 with a history of emphysema/obstructive lung disease.  He is had dyspnea at a proportion to his pulmonary function testing.  Not currently on scheduled BD therapy.  He has not desaturated in the past.  Today he reports that his breathing is about the same. Worse in the cool weather. Minimal benefit from his albuterol. He can get SOB walking through the house.   He has participated in lung cancer screening program, last CT chest 10/04/2022 which was reassuring, RADS 2 study.  It did confirm his centrilobular and paraseptal emphysema, scattered nodules that were stable largest 9.3 mm   ROV 06/12/2023 --follow-up visit for [name]  He is 63 with a history of emphysematous COPD and associated exertional dyspnea.  He participates in lung cancer screening program (due for repeat in June).  He is not currently on any long-acting BD therapy because he has tried these and really has not gotten significant benefit. He had COVID in 01/2023, and viral sx improved but he has persistent SOB w exertion.  He developed URI symptoms about 2 weeks ago and has had persistent cough, exertional and resting shortness of breath. He still has cough with clear mucous production. Has chest tightness.  He is waking up in the middle of the night with dyspnea which is new - feels unable to get a good deep breath in. He does snore. He hasn't had any benefit from albuterol w this illness. He is not having any overt GERD but he had gastritis on his EGD and has been on PPI bid, now every day + famotidine at bedtime.   Pulmonary function testing 05/19/2023 reviewed by me shows overall normal airflows with an FEV1 of 3.66 L (101% predicted).  There is no bronchodilator response.  Lung volumes are normal.  Diffusion capacity is decreased.    Alpha-1 antitrypsin level 161 on 05/30/2017  PSG 06/19/21 >> AHI was 0.    ROV 07/11/2023 --63 year old gentleman with emphysematous COPD but minimal overt obstruction on his spirometry, chronic cough, history of COVID-19 in 01/2023.  I saw him in 06/12/2023 and treated him for possible flaring and bronchitic symptoms with prednisone and cefuroxime.  At that time we held off on BD therapy because he had not benefited in the past.  He saw Dr. Sherene Sires 06/24/2023 in the setting of a URI.  He was having wheezing and had failed to respond to antibiotic and short course of prednisone.  Chest x-ray with question right sided infiltrate.  He was treated with Doxy, received Depo-Medrol in the office.  Today he reports He feels better, but does still have cough, often dry but sometimes mucous. Has a tickle in his throat. He took breztri and he believed that it helped his SOB, didn't seem to impact the cough.  He increased PPI to bid about 1 week ago - may have helped his nighttime cough. Stopped the nocturnal pepcid  Review of Systems As above  Past Medical History:  Diagnosis Date   Abdominal pain, epigastric 06/03/2023   Dental crown present    GERD (gastroesophageal reflux disease)    Post-operative infection 09/21/2011   left leg   PTSD (post-traumatic stress disorder)    Seasonal allergies    Wound infection after surgery, left lateral leg 10/08/2011     Family History  Problem Relation Age of Onset  Heart Problems Mother    Stroke Mother    Pulmonary fibrosis Mother    Cancer - Lung Father        smoked   Heart failure Maternal Grandmother    Emphysema Maternal Grandfather        "his whole family had Emphysema"   Diabetes Paternal Grandmother    Cancer Paternal Grandfather      Social History   Socioeconomic History   Marital status: Married    Spouse name: [name]   Number of children: 2   Years of education: 12   Highest education level: Tax adviser degree: occupational, Scientist, product/process development, or vocational program  Occupational History    Employer: [employer]    Comment:  [employer]  Tobacco Use   Smoking status: Former    Current packs/day: 0.00    Average packs/day: 1 pack/day for 36.5 years (36.5 ttl pk-yrs)    Types: Cigarettes    Start date: 40    Quit date: 11/01/2015    Years since quitting: 7.7    Passive exposure: Past   Smokeless tobacco: Never  Vaping Use   Vaping status: Never Used  Substance and Sexual Activity   Alcohol use: Yes    Alcohol/week: 2.0 standard drinks of alcohol    Types: 2 Standard drinks or equivalent per week    Comment: occasionally   Drug use: No   Sexual activity: Not on file  Other Topics Concern   Not on file  Social History Narrative   Married father of 2, Grandfather of 3. Wife's name is [name].   He works as a Passenger transport manager for [employer] 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   Currently smokes a pack a day, and drinks maybe 2-3 alcohol beverages a week.         Epworth Sleepiness Scale      Score: 9      --I seem to be losing my sex drive   --I feel stressed and lack motivation   --I have COPD   --I have been told that I snore   --I am overweight or am gaining weight   --I awake feeling not rested   Social Drivers of Health   Financial Resource Strain: Low Risk  (06/05/2023)   Overall Financial Resource Strain (CARDIA)    Difficulty of Paying Living Expenses: Not hard at all  Food Insecurity: No Food Insecurity (06/05/2023)   Hunger Vital Sign    Worried About Running Out of Food in the Last Year: Never true    Ran Out of Food in the Last Year: Never true  Transportation Needs: No Transportation Needs (06/05/2023)   PRAPARE - Administrator, Civil Service (Medical): No    Lack of Transportation (Non-Medical): No  Physical Activity: Inactive (06/05/2023)   Exercise Vital Sign    Days of Exercise per Week: 0 days    Minutes of Exercise per Session: 0 min  Stress: Stress Concern Present (06/05/2023)   [employer] of Occupational Health - Occupational  Stress Questionnaire    Feeling of Stress : Rather much  Social Connections: Unknown (06/05/2023)   Social Connection and Isolation Panel [NHANES]    Frequency of Communication with Friends and Family: Once a week    Frequency of Social Gatherings with Friends and Family: Patient declined    Attends Religious Services: More than 4 times per year    Active Member of [employer] or Organizations: Patient declined  Attends Banker Meetings: Patient declined    Marital Status: Married  Catering manager Violence: Not At Risk (06/06/2023)   Humiliation, Afraid, Rape, and Kick questionnaire    Fear of Current or Ex-Partner: No    Emotionally Abused: No    Physically Abused: No    Sexually Abused: No  [employer]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 90 tablet 0   diclofenac Sodium (VOLTAREN) 1 % GEL Apply topically 4 (four) times daily.     famotidine (PEPCID) 40 MG tablet TAKE 1 TABLET BY MOUTH EVERYDAY AT BEDTIME 90 tablet 1   pantoprazole (PROTONIX) 40 MG tablet Take 1 tablet (40 mg total) by mouth daily before breakfast.     rizatriptan (MAXALT) 10 MG tablet Take 10 mg by mouth as needed.     sildenafil (VIAGRA) 100 MG tablet Take 1 tablet (100 mg total) by mouth as needed for erectile dysfunction. 30 tablet 4   silodosin (RAPAFLO) 8 MG CAPS capsule Take 1 capsule (8 mg total) by mouth at bedtime. 30 capsule 11   tamsulosin (FLOMAX) 0.4 MG CAPS capsule TAKE 1 CAPSULE BY MOUTH EVERY DAY AFTER SUPPER (Patient taking differently: 0.4 mg in the morning and at bedtime.) 90 capsule 3   topiramate (TOPAMAX) 100 MG  tablet Take 100 mg by mouth at bedtime.     albuterol (VENTOLIN HFA) 108 (90 Base) MCG/ACT inhaler Inhale 2 puffs into the lungs every 6 (six) hours as needed for wheezing or shortness of breath. (Patient not taking: Reported on 07/11/2023) 18 g 5   cyclobenzaprine (FLEXERIL) 10 MG tablet Take 10 mg by mouth daily. (Patient not taking: Reported on 07/11/2023)     No facility-administered medications prior to visit.         Objective:   Physical Exam Vitals:   07/11/23 1022  BP: 125/82  Pulse: 70  SpO2: 94%  Weight: 190 lb 6.4 oz (86.4 kg)  Height: 5\' 10"  (1.778 m)    Gen: Pleasant, well-nourished, in no distress,  normal affect  ENT: No lesions,  mouth clear,  oropharynx clear, no postnasal drip  Neck: No JVD, no stridor  Lungs: No use of accessory muscles, distant, no crackles or wheezing on normal respiration, no wheezing on forced expiration  Cardiovascular: RRR, heart sounds normal, no murmur or gallops, no peripheral edema  Musculoskeletal: No deformities, no cyanosis or clubbing  Neuro: alert, awake, non focal  Skin: Warm, no lesions or rash       Assessment & Plan:  CAP (community acquired pneumonia) Improved. Plan to repeat CXR today. If persistent infiltrate will follow CXR for resolution, consider CT chest to better define  COPD with emphysema [facility]) We will try starting Breztri 2 puffs twice a day.  Rinse and gargle after you use it. Keep albuterol available to use 2 puffs up to every 4 hours if needed for shortness of breath, chest tightness, wheezing.  Follow Dr. Delton Coombes in June to review your status and your CT chest  History of tobacco abuse Get your lung cancer screening CT chest in June  as planned  GERD (gastroesophageal reflux disease) Continue your pantoprazole 40 mg twice a day.  Try to take 1 hour around food.  Allergic rhinitis Try starting loratadine 10 mg (generic Claritin) daily through the allergy season    Levy Pupa, MD,  PhD 07/14/2023, 7:17 PM [facility] [phone] or if no answer [phone]

## 2023-07-13 ENCOUNTER — Encounter: Payer: Self-pay | Admitting: Family Medicine

## 2023-07-14 DIAGNOSIS — J189 Pneumonia, unspecified organism: Secondary | ICD-10-CM | POA: Insufficient documentation

## 2023-07-14 DIAGNOSIS — J309 Allergic rhinitis, unspecified: Secondary | ICD-10-CM | POA: Insufficient documentation

## 2023-07-14 NOTE — Assessment & Plan Note (Signed)
 Continue your pantoprazole 40 mg twice a day.  Try to take 1 hour around food.

## 2023-07-14 NOTE — Assessment & Plan Note (Signed)
 Try starting loratadine 10 mg (generic Claritin) daily through the allergy season

## 2023-07-14 NOTE — Assessment & Plan Note (Signed)
 We will try starting Breztri 2 puffs twice a day.  Rinse and gargle after you use it. Keep albuterol available to use 2 puffs up to every 4 hours if needed for shortness of breath, chest tightness, wheezing.  Follow Dr. Delton Coombes in June to review your status and your CT chest

## 2023-07-14 NOTE — Assessment & Plan Note (Signed)
 Get your lung cancer screening CT chest in June as planned

## 2023-07-14 NOTE — Assessment & Plan Note (Signed)
 Improved. Plan to repeat CXR today. If persistent infiltrate will follow CXR for resolution, consider CT chest to better define

## 2023-07-15 ENCOUNTER — Encounter: Payer: Self-pay | Admitting: Emergency Medicine

## 2023-08-20 DIAGNOSIS — Z808 Family history of malignant neoplasm of other organs or systems: Secondary | ICD-10-CM | POA: Diagnosis not present

## 2023-08-20 DIAGNOSIS — L578 Other skin changes due to chronic exposure to nonionizing radiation: Secondary | ICD-10-CM | POA: Diagnosis not present

## 2023-08-20 DIAGNOSIS — L820 Inflamed seborrheic keratosis: Secondary | ICD-10-CM | POA: Diagnosis not present

## 2023-08-20 DIAGNOSIS — D2261 Melanocytic nevi of right upper limb, including shoulder: Secondary | ICD-10-CM | POA: Diagnosis not present

## 2023-08-20 DIAGNOSIS — L821 Other seborrheic keratosis: Secondary | ICD-10-CM | POA: Diagnosis not present

## 2023-08-20 DIAGNOSIS — D225 Melanocytic nevi of trunk: Secondary | ICD-10-CM | POA: Diagnosis not present

## 2023-08-20 DIAGNOSIS — B353 Tinea pedis: Secondary | ICD-10-CM | POA: Diagnosis not present

## 2023-09-22 ENCOUNTER — Encounter: Payer: Self-pay | Admitting: Emergency Medicine

## 2023-09-22 NOTE — Telephone Encounter (Signed)
 Annual Lung CT due 10/04/2023, scheduled for 10/08/2023 at [facility]

## 2023-10-08 ENCOUNTER — Ambulatory Visit (HOSPITAL_COMMUNITY)
Admission: RE | Admit: 2023-10-08 | Discharge: 2023-10-08 | Disposition: A | Discharge status: Home / Self Care | Source: Ambulatory Visit | Attending: Acute Care | Admitting: Acute Care

## 2023-10-08 DIAGNOSIS — Z122 Encounter for screening for malignant neoplasm of respiratory organs: Secondary | ICD-10-CM | POA: Insufficient documentation

## 2023-10-08 DIAGNOSIS — Z87891 Personal history of nicotine dependence: Secondary | ICD-10-CM | POA: Diagnosis not present

## 2023-10-10 ENCOUNTER — Encounter: Payer: Self-pay | Admitting: Emergency Medicine

## 2023-10-10 ENCOUNTER — Ambulatory Visit: Payer: HMO | Admitting: Emergency Medicine

## 2023-10-10 VITALS — BP 135/78 | HR 55 | Ht 70.0 in | Wt 192.0 lb

## 2023-10-10 DIAGNOSIS — Z87891 Personal history of nicotine dependence: Secondary | ICD-10-CM

## 2023-10-10 DIAGNOSIS — J431 Panlobular emphysema: Secondary | ICD-10-CM | POA: Diagnosis not present

## 2023-10-10 DIAGNOSIS — J301 Allergic rhinitis due to pollen: Secondary | ICD-10-CM

## 2023-10-10 MED ORDER — FLUTICASONE PROPIONATE 50 MCG/ACT NA SUSP: 2.0000 | Freq: Every day | NASAL | 3 refills | Status: DC

## 2023-10-10 NOTE — Assessment & Plan Note (Signed)
 Emphysematous COPD, unsure that Breztri  continues to help him although it may have been beneficial in the setting of his flare.  He is now off it and is tolerating.  Uses albuterol  occasionally.

## 2023-10-10 NOTE — Patient Instructions (Signed)
 Agree with stopping Breztri  for now. Keep albuterol  available to use 2 puffs if needed for shortness of breath, chest tightness, wheezing. Continue loratadine 10 mg once daily. Try starting fluticasone nasal spray, 2 sprays each nostril once daily for your sinus congestion, drainage and allergy symptoms. We reviewed your lung cancer screening CT scan of the chest.  This is a reassuring study.  We will follow-up with the official report when it is available.  For now plan will be to repeat in 1 year. Follow Dr. Baldwin Levee in 1 year, please call if you have any issues so we can see you sooner.

## 2023-10-10 NOTE — Progress Notes (Signed)
 Subjective:    Patient ID: Peter Mahoney, male    DOB: 1960-07-01, 63 y.o.   MRN: [id number]  HPI  ROV 07/11/2023 --63 year old gentleman with emphysematous COPD but minimal overt obstruction on his spirometry, chronic cough, history of COVID-19 in 01/2023.  I saw him in 06/12/2023 and treated him for possible flaring and bronchitic symptoms with prednisone  and cefuroxime .  At that time we held off on BD therapy because he had not benefited in the past.  He saw Dr. Waymond Hailey 06/24/2023 in the setting of a URI.  He was having wheezing and had failed to respond to antibiotic and short course of prednisone .  Chest x-ray with question right sided infiltrate.  He was treated with Doxy, received Depo-Medrol  in the office.  Today he reports He feels better, but does still have cough, often dry but sometimes mucous. Has a tickle in his throat. He took breztri  and he believed that it helped his SOB, didn't seem to impact the cough.  He increased PPI to bid about 1 week ago - may have helped his nighttime cough. Stopped the nocturnal pepcid   ROV 10/10/2023 --follow-up visit for 63 year old man with emphysematous COPD but minimal overt obstruction on his spirometry.  He has a history of COVID-19 in 2024, chronic cough.  We have been managing him most recently on Breztri  following a URI and then pneumonia earlier this year.  He does have some upper airway irritation with the rhinitis, blows some green mucous out every morning, GERD. Uses albuterol  3-4x a mointh. Stopped Breztri , doesn;t seem to miss it.  In retrospect hard to say that it was ever really helping him.  He may have just improved after treatment from his flare/pneumonia.  Chest x-ray 07/11/2023 reviewed, shows a stable chronic right upper lobe opacity at the minor fissure, no new findings  Lung cancer screening CT scan of the chest done 10/08/2023 reviewed by me.  Final read is still pending.  No significant change compared with prior from 09/2022.  Stable  pulmonary nodules, largest 9 mm  Review of Systems As above  Past Medical History:  Diagnosis Date   Abdominal pain, epigastric 06/03/2023   Dental crown present    GERD (gastroesophageal reflux disease)    Post-operative infection 09/21/2011   left leg   PTSD (post-traumatic stress disorder)    Seasonal allergies    Wound infection after surgery, left lateral leg 10/08/2011     Family History  Problem Relation Age of Onset   Heart Problems Mother    Stroke Mother    Pulmonary fibrosis Mother    Cancer - Lung Father        smoked   Heart failure Maternal Grandmother    Emphysema Maternal Grandfather        his whole family had Emphysema   Diabetes Paternal Grandmother    Cancer Paternal Grandfather      Social History   Socioeconomic History   Marital status: Married    Spouse name: Benigno Brakeman   Number of children: 2   Years of education: 12   Highest education level: Associate degree: occupational, Scientist, product/process development, or vocational program  Occupational History    Employer: [employer]     Comment: [employer]  Tobacco Use   Smoking status: Former    Current packs/day: 0.00    Average packs/day: 1 pack/day for 36.5 years (36.5 ttl pk-yrs)    Types: Cigarettes    Start date: 10    Quit date: 11/01/2015  Years since quitting: 7.9    Passive exposure: Past   Smokeless tobacco: Never  Vaping Use   Vaping status: Never Used  Substance and Sexual Activity   Alcohol use: Yes    Alcohol/week: 2.0 standard drinks of alcohol    Types: 2 Standard drinks or equivalent per week    Comment: occasionally   Drug use: No   Sexual activity: Not on file  Other Topics Concern   Not on file  Social History Narrative   Married father of 2, Grandfather of 3. Wife's name is Benigno Brakeman.   He works as a Passenger transport manager for [employer] Does not exercise because he does not have time.   Currently smokes a pack a day, and drinks maybe 2-3 alcohol beverages a week.         Epworth  Sleepiness Scale      Score: 9      --I seem to be losing my sex drive   --I feel stressed and lack motivation   --I have COPD   --I have been told that I snore   --I am overweight or am gaining weight   --I awake feeling not rested   Social Drivers of Health   Financial Resource Strain: Low Risk  (06/05/2023)   Overall Financial Resource Strain (CARDIA)    Difficulty of Paying Living Expenses: Not hard at all  Food Insecurity: No Food Insecurity (06/05/2023)   Hunger Vital Sign    Worried About Running Out of Food in the Last Year: Never true    Ran Out of Food in the Last Year: Never true  Transportation Needs: No Transportation Needs (06/05/2023)   PRAPARE - Administrator, Civil Service (Medical): No    Lack of Transportation (Non-Medical): No  Physical Activity: Inactive (06/05/2023)   Exercise Vital Sign    Days of Exercise per Week: 0 days    Minutes of Exercise per Session: 0 min  Stress: Stress Concern Present (06/05/2023)   [employer] of Occupational Health - Occupational Stress Questionnaire    Feeling of Stress : Rather much  Social Connections: Unknown (06/05/2023)   Social Connection and Isolation Panel    Frequency of Communication with Friends and Family: Once a week    Frequency of Social Gatherings with Friends and Family: Patient declined    Attends Religious Services: More than 4 times per year    Active Member of [employer] or Organizations: Patient declined    Attends Banker Meetings: Patient declined    Marital Status: Married  Catering manager Violence: Not At Risk (06/06/2023)   Humiliation, Afraid, Rape, and Kick questionnaire    Fear of Current or Ex-Partner: No    Emotionally Abused: No    Physically Abused: No    Sexually Abused: No  [employer]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 (Patient not taking: Reported on 07/11/2023) 18 g 5   budeson-glycopyrrolate-formoterol (BREZTRI  AEROSPHERE) 160-9-4.8 MCG/ACT AERO Inhale 2 puffs into the lungs in the morning and at bedtime.  10.7 g 6   cyclobenzaprine (FLEXERIL) 10 MG tablet Take 10 mg by mouth daily. (Patient not taking: Reported on 07/11/2023)     diazepam  (VALIUM ) 10 MG tablet Take 1 tablet (10 mg total) by mouth at bedtime as needed for anxiety. 90 tablet 0   diclofenac  Sodium (VOLTAREN ) 1 % GEL Apply topically 4 (four) times daily.     famotidine  (PEPCID ) 40 MG tablet TAKE 1 TABLET BY MOUTH EVERYDAY AT BEDTIME 90 tablet 1   pantoprazole  (PROTONIX ) 40 MG tablet Take 1 tablet (40 mg total) by mouth daily before breakfast.     rizatriptan (MAXALT) 10 MG tablet Take 10 mg by mouth as needed.     sildenafil  (VIAGRA ) 100 MG tablet Take 1 tablet (100 mg total) by mouth as needed for erectile dysfunction. 30 tablet 4   silodosin  (RAPAFLO ) 8 MG CAPS capsule Take 1 capsule (8 mg total) by mouth at bedtime. 30 capsule 11   tamsulosin  (FLOMAX ) 0.4 MG CAPS capsule TAKE 1 CAPSULE BY MOUTH EVERY DAY AFTER SUPPER (Patient taking differently: 0.4 mg in the morning and at bedtime.) 90 capsule 3   topiramate (TOPAMAX) 100 MG tablet Take 100 mg by mouth at bedtime.     No facility-administered medications prior to visit.         Objective:   Physical Exam Vitals:   10/10/23 1004  BP: 135/78  Pulse: (!) 55  SpO2: 95%  Weight: 192 lb (87.1 kg)  Height: 5' 10 (1.778 m)    Gen: Pleasant, well-nourished, in no distress,  normal affect  ENT: No lesions,  mouth clear,  oropharynx clear, no postnasal drip  Neck: No JVD, no  stridor  Lungs: No use of accessory muscles, distant, no crackles or wheezing on normal respiration, no wheezing on forced expiration  Cardiovascular: RRR, heart sounds normal, no murmur or gallops, no peripheral edema  Musculoskeletal: No deformities, no cyanosis or clubbing  Neuro: alert, awake, non focal  Skin: Warm, no lesions or rash       Assessment & Plan:  COPD with emphysema (HCC) Emphysematous COPD, unsure that Breztri  continues to help him although it may have been beneficial in the setting of his flare.  He is now off it and is tolerating.  Uses albuterol  occasionally.  Allergic rhinitis Has been more bothersome, congestion, greenish drainage in the morning, clear drainage throughout the day.  We started loratadine but he still has symptoms.  We will add fluticasone nasal spray now.  History of tobacco abuse Quit 8 years ago.  I reviewed his lung cancer screening CT today, appears to be stable.  The final report is pending.  For now we will plan to repeat his scan in 1 year.  If there are any notable findings on the official report then we will adjust the timing and workup.   Time spent 25 minutes  Racheal Buddle, MD, PhD 10/10/2023, 10:30 AM [facility] [phone] or if no answer [phone]

## 2023-10-10 NOTE — Assessment & Plan Note (Signed)
 Quit 8 years ago.  I reviewed his lung cancer screening CT today, appears to be stable.  The final report is pending.  For now we will plan to repeat his scan in 1 year.  If there are any notable findings on the official report then we will adjust the timing and workup.

## 2023-10-10 NOTE — Assessment & Plan Note (Signed)
 Has been more bothersome, congestion, greenish drainage in the morning, clear drainage throughout the day.  We started loratadine but he still has symptoms.  We will add fluticasone nasal spray now.

## 2023-11-04 ENCOUNTER — Telehealth: Payer: Self-pay | Admitting: Acute Care

## 2023-11-04 DIAGNOSIS — R911 Solitary pulmonary nodule: Secondary | ICD-10-CM

## 2023-11-04 NOTE — Telephone Encounter (Signed)
 Patient called back. Informed him of the results and recommendations regarding his lung nodule growth and 6 month follow up scan. Patient verbalized understanding and is agreeable to follow up scan. Order placed. We also discussed the hepatic steatosis and healthy diet options. Patient already aware of emphysema. Patient is following regularly with his PCP regarding his cholesterol, not on statin therapy. Lastly, patient confirms GERD and is currently on protonix  for this. Patient aware we will reach out to him when it gets closer to his follow up scan date. Results and plan sent to PCP.

## 2023-11-04 NOTE — Telephone Encounter (Signed)
 LVM to call office and review recent Lung CT results.

## 2023-11-04 NOTE — Telephone Encounter (Signed)
 Patient had LDCT on 10/08/2023 that resulted as an LR2. However, after review from Lauraine Lites NP there has been nodule growth since last years scan, from 9.2mm to 10mm. Lauraine has recommended the patient have a 6 month follow up scan to evaluate nodule stability.     IMPRESSION: 1. Lung-RADS 2, benign appearance or behavior. Continue annual screening with low-dose chest CT without contrast in 12 months. 2. Esophageal air fluid level suggests dysmotility or gastroesophageal reflux. 3. Hepatic steatosis. 4. Aortic Atherosclerosis (ICD10-I70.0) and Emphysema (ICD10-J43.9). 5. Age advanced coronary artery atherosclerosis. Recommend assessment of coronary risk factors.     Electronically Signed   By: Rockey Kilts M.D.   On: 10/17/2023 16:33

## 2023-11-08 ENCOUNTER — Other Ambulatory Visit: Payer: Self-pay | Admitting: Family Medicine

## 2024-01-03 ENCOUNTER — Other Ambulatory Visit: Payer: Self-pay | Admitting: Emergency Medicine

## 2024-03-01 ENCOUNTER — Ambulatory Visit: Payer: PPO | Admitting: Urology

## 2024-03-01 VITALS — BP 144/73 | HR 68

## 2024-03-01 DIAGNOSIS — N529 Male erectile dysfunction, unspecified: Secondary | ICD-10-CM | POA: Diagnosis not present

## 2024-03-01 DIAGNOSIS — R3912 Poor urinary stream: Secondary | ICD-10-CM | POA: Diagnosis not present

## 2024-03-01 DIAGNOSIS — N138 Other obstructive and reflux uropathy: Secondary | ICD-10-CM

## 2024-03-01 DIAGNOSIS — N401 Enlarged prostate with lower urinary tract symptoms: Secondary | ICD-10-CM | POA: Diagnosis not present

## 2024-03-01 LAB — URINALYSIS, ROUTINE W REFLEX MICROSCOPIC
Bilirubin, UA: NEGATIVE
Glucose, UA: NEGATIVE
Ketones, UA: NEGATIVE
Leukocytes,UA: NEGATIVE
Nitrite, UA: NEGATIVE
Protein,UA: NEGATIVE
RBC, UA: NEGATIVE
Specific Gravity, UA: <1.005 — ABNORMAL LOW (ref 1.005–1.030)
Urobilinogen, Ur: 0.2 mg/dL (ref 0.2–1.0)
pH, UA: 6 (ref 5.0–7.5)

## 2024-03-01 MED ORDER — TAMSULOSIN HCL 0.4 MG PO CAPS: 0.4000 mg | ORAL_CAPSULE | Freq: Two times a day (BID) | ORAL | 3 refills | Status: AC

## 2024-03-01 MED ORDER — SILDENAFIL CITRATE 100 MG PO TABS: 100.0000 mg | ORAL_TABLET | ORAL | 4 refills | Status: AC | PRN

## 2024-03-01 NOTE — Progress Notes (Unsigned)
 03/01/2024 1:52 PM   Peter Peter Apr 17, 1961 [id number]  Referring provider: Cook, Peter G, DO [address] [place],  [place] [address]  Followup BPH and erectile dysfunction   HPI: Peter Peter is a 63yo here for followup for BPh and erectile dysfunction. He took rapaflo  last year and then went back to taking flomax . IPSS 4 QOL 2 on flomax  0.4mg  BID. Nocturia 1x. Urine stream strong. No straining to urinate. He takes sildenafil  100mg  prn with mixed results. Last PSA was 1.2   PMH: Past Medical History:  Diagnosis Date   Abdominal pain, epigastric 06/03/2023   Dental crown present    GERD (gastroesophageal reflux disease)    Post-operative infection 09/21/2011   left leg   PTSD (post-traumatic stress disorder)    Seasonal allergies    Wound infection after surgery, left lateral leg 10/08/2011    Surgical History: Past Surgical History:  Procedure Laterality Date   BACK SURGERY  04/1995   L5-S1   BIOPSY  02/10/2023   Procedure: BIOPSY;  Surgeon: Peter Carlin MARLA, DO;  [facility]: [facility];  Service: Endoscopy;;   COLONOSCOPY N/A 05/12/2015   Procedure: COLONOSCOPY;  Surgeon: Peter Budge, MD;  [facility]: [facility];  Service: Gastroenterology;  Laterality: N/A;   COLONOSCOPY WITH PROPOFOL  N/A 02/10/2023   Procedure: COLONOSCOPY WITH PROPOFOL ;  Surgeon: Peter Carlin MARLA, DO;  Location: [facility];  Service: Endoscopy;  Laterality: N/A;  8:15 AM, ASA 3   ESOPHAGOGASTRODUODENOSCOPY (EGD) WITH PROPOFOL  N/A 02/10/2023   Procedure: ESOPHAGOGASTRODUODENOSCOPY (EGD) WITH PROPOFOL ;  Surgeon: Peter Carlin MARLA, DO;  Location: [facility];  Service: Endoscopy;  Laterality: N/A;   INCISION AND DRAINAGE OF WOUND  10/08/2011   Procedure: IRRIGATION AND DEBRIDEMENT WOUND;  Surgeon: Peter SHAUNNA Olmsted, MD;  [facility];  Service: Orthopedics;  Laterality: Left;  left left post op wound   KNEE ARTHROSCOPY  11/2010   left knee - meniscus tear   KNEE SURGERY   08/26/2011   hamstring tendon tear left   NM MYOVIEW  LTD  11/17/2012   Exercise 7 minutes. EF 60%. No ischemia or infarction.; Low risk   TONSILLECTOMY     as a child   TRANSTHORACIC ECHOCARDIOGRAM  01/2016   Normal LV function EF 5560%. No regional wall motion abnormality. 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 Mild LA dilation. Otherwise normal    Home Medications:  Allergies as of 03/01/2024       Reactions   Dilaudid  [hydromorphone  Hcl] Other (See Comments)   Urinary retention- says with most opioids taken   Azithromycin Diarrhea   Doxycycline  Nausea And Vomiting   Oxycodone          Medication List        Accurate as of March 01, 2024  1:52 PM. If you have any questions, ask your nurse or doctor.          albuterol  108 (90 Base) MCG/ACT inhaler Commonly known as: VENTOLIN  HFA Inhale 2 puffs into the lungs every 6 (six) hours as needed for wheezing or shortness of breath.   Breztri  Aerosphere 160-9-4.8 MCG/ACT Aero inhaler Generic drug: budesonide-glycopyrrolate-formoterol Inhale 2 puffs into the lungs in the morning and at bedtime.   cyclobenzaprine 10 MG tablet Commonly known as: FLEXERIL Take 10 mg by mouth daily.   diazepam  10 MG tablet Commonly known as: VALIUM  TAKE 1 TABLET (10 MG TOTAL) BY MOUTH AT BEDTIME AS NEEDED  FOR ANXIETY   diclofenac  Sodium 1 % Gel Commonly known as: VOLTAREN  Apply topically 4 (four) times daily.   famotidine  40 MG tablet Commonly known as: PEPCID  TAKE 1 TABLET BY MOUTH EVERYDAY AT BEDTIME   fluticasone  50 MCG/ACT nasal spray Commonly known as: FLONASE  SPRAY 2 SPRAYS INTO EACH NOSTRIL EVERY DAY   pantoprazole  40 MG tablet Commonly known as: PROTONIX  Take 1 tablet (40 mg total) by mouth daily before breakfast.   rizatriptan 10 MG tablet Commonly known as: MAXALT Take 10 mg by mouth as needed.   sildenafil  100 MG tablet Commonly known as: VIAGRA  Take 1 tablet  (100 mg total) by mouth as needed for erectile dysfunction.   silodosin  8 MG Caps capsule Commonly known as: RAPAFLO  Take 1 capsule (8 mg total) by mouth at bedtime.   tamsulosin  0.4 MG Caps capsule Commonly known as: FLOMAX  TAKE 1 CAPSULE BY MOUTH EVERY DAY AFTER SUPPER What changed: See the new instructions.   topiramate 100 MG tablet Commonly known as: TOPAMAX Take 100 mg by mouth at bedtime.        Allergies:  Allergies  Allergen Reactions   Dilaudid  [Hydromorphone  Hcl] Other (See Comments)    Urinary retention- says with most opioids taken   Azithromycin Diarrhea   Doxycycline  Nausea And Vomiting   Oxycodone      Family History: Family History  Problem Relation Age of Onset   Heart Problems Mother    Stroke Mother    Pulmonary fibrosis Mother    Cancer - Lung Father        smoked   Heart failure Maternal Grandmother    Emphysema Maternal Grandfather        his whole family had Emphysema   Diabetes Paternal Grandmother    Cancer Paternal Grandfather     Social History:  reports that he quit smoking about 8 years ago. His smoking use included cigarettes. He started smoking about 44 years ago. He has a 36.5 pack-year smoking history. He has been exposed to tobacco smoke. He has never used smokeless tobacco. He reports current alcohol use of about 2.0 standard drinks of alcohol per week. He reports that he does not use drugs.  ROS: All other review of systems were reviewed and are negative except what is noted above in HPI  Physical Exam: BP (!) 144/73   Pulse 68   Constitutional:  Alert and oriented, No acute distress. HEENT: [place] AT, moist mucus membranes.  Trachea midline, no masses. Cardiovascular: No clubbing, cyanosis, or edema. Respiratory: Normal respiratory effort, no increased work of breathing. GI: Abdomen is soft, nontender, nondistended, no abdominal masses GU: No CVA tenderness.  Lymph: No cervical or inguinal lymphadenopathy. Skin: No rashes,  bruises or suspicious lesions. Neurologic: Grossly intact, no focal deficits, moving all 4 extremities. Psychiatric: Normal mood and affect.  Laboratory Data: Lab Results  Component Value Date   WBC 7.7 07/09/2023   HGB 15.2 07/09/2023   HCT 46.3 07/09/2023   MCV 98 (H) 07/09/2023   PLT 243 07/09/2023    Lab Results  Component Value Date   CREATININE 1.20 07/09/2023    No results found for: PSA  No results found for: TESTOSTERONE   No results found for: HGBA1C  Urinalysis    Component Value Date/Time   APPEARANCEUR Clear 02/03/2023 1311   GLUCOSEU Negative 02/03/2023 1311   BILIRUBINUR Negative 02/03/2023 1311   PROTEINUR Negative 02/03/2023 1311   NITRITE Negative 02/03/2023 1311   LEUKOCYTESUR Negative 02/03/2023 1311  Lab Results  Component Value Date   LABMICR Comment 02/03/2023    Pertinent Imaging:  No results found for this or any previous visit.  No results found for this or any previous visit.  No results found for this or any previous visit.  No results found for this or any previous visit.  No results found for this or any previous visit.  No results found for this or any previous visit.  No results found for this or any previous visit.  No results found for this or any previous visit.   Assessment & Plan:    1. Benign prostatic hyperplasia with urinary obstruction (Primary) Flomax  0.4mg  BID - Urinalysis, Routine w reflex microscopic; Future - Urinalysis, Routine w reflex microscopic  2. Weak urinary stream Flomax  0.4mg  BID  3. Erectile dysfunction, unspecified erectile dysfunction type Sildenafil  100mg  prn   No follow-ups on file.  Peter Clara, MD  [facility]

## 2024-03-02 ENCOUNTER — Encounter: Payer: Self-pay | Admitting: Urology

## 2024-03-02 ENCOUNTER — Other Ambulatory Visit: Payer: Self-pay | Admitting: Family Medicine

## 2024-03-02 ENCOUNTER — Other Ambulatory Visit

## 2024-03-02 DIAGNOSIS — N529 Male erectile dysfunction, unspecified: Secondary | ICD-10-CM | POA: Diagnosis not present

## 2024-03-02 NOTE — Patient Instructions (Signed)

## 2024-03-04 LAB — TESTOSTERONE,FREE AND TOTAL
Testosterone, Free: 6.3 pg/mL — ABNORMAL LOW (ref 6.6–18.1)
Testosterone: 523 ng/dL (ref 264–916)

## 2024-03-09 ENCOUNTER — Encounter: Payer: Self-pay | Admitting: Urology

## 2024-03-16 ENCOUNTER — Ambulatory Visit: Payer: Self-pay

## 2024-04-08 ENCOUNTER — Ambulatory Visit (HOSPITAL_COMMUNITY)

## 2024-04-08 DIAGNOSIS — R911 Solitary pulmonary nodule: Secondary | ICD-10-CM | POA: Diagnosis present

## 2024-04-19 ENCOUNTER — Other Ambulatory Visit: Payer: Self-pay

## 2024-04-19 DIAGNOSIS — Z122 Encounter for screening for malignant neoplasm of respiratory organs: Secondary | ICD-10-CM

## 2024-04-19 DIAGNOSIS — Z87891 Personal history of nicotine dependence: Secondary | ICD-10-CM

## 2024-04-28 ENCOUNTER — Encounter: Payer: Self-pay | Admitting: Emergency Medicine

## 2024-04-28 ENCOUNTER — Ambulatory Visit: Admitting: Emergency Medicine

## 2024-04-28 VITALS — BP 112/76 | HR 73 | Ht 70.0 in | Wt 189.4 lb

## 2024-04-28 DIAGNOSIS — J431 Panlobular emphysema: Secondary | ICD-10-CM

## 2024-04-28 DIAGNOSIS — R918 Other nonspecific abnormal finding of lung field: Secondary | ICD-10-CM | POA: Diagnosis not present

## 2024-04-28 DIAGNOSIS — J301 Allergic rhinitis due to pollen: Secondary | ICD-10-CM

## 2024-04-28 NOTE — Assessment & Plan Note (Signed)
 Following with serial imaging, now via the lung cancer screening program.  He quit smoking 9 years ago.  Next scan to be done in 03/2025.  We reviewed your CT scan of the chest today. Repeat lung cancer screening CT chest in 03/2025.

## 2024-04-28 NOTE — Progress Notes (Signed)
 "  Subjective:    Patient ID: Peter Mahoney, male    DOB: [birth year], 64 y.o.   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 Currently managed on albuterol  as needed.  He has been on Breztri  in the past but when he is at his usual baseline he does not really notice any clinical benefit.  He has gotten some benefit in the setting of acute flares.  He has chronic rhinitis, and is currently using zyrtec and flonase  prn. Rare albuterol  use He will get SOB with any exertion - stairs, hills. He coughs in the am, clear mucous especially in the am.   Lung cancer screening CT chest 04/08/2024 reviewed by me, shows centrilobular and paraseptal emphysema, scattered pulmonary nodules that are all stable, up to 9 mm.  No new nodules there is some ill-defined peribronchovascular ground glass change noted on the left  Review of Systems As above  Past Medical History:  Diagnosis Date   Abdominal pain, epigastric 06/03/2023   Dental crown present    GERD (gastroesophageal reflux disease)    Post-operative infection 09/21/2011   left leg   PTSD (post-traumatic stress disorder)    Seasonal allergies    Wound infection after surgery, left lateral leg 10/08/2011     Family History  Problem Relation Age of Onset   Heart Problems Mother    Stroke Mother    Pulmonary fibrosis Mother    Cancer - Lung Father        smoked   Heart failure Maternal Grandmother    Emphysema Maternal Grandfather        his whole family had Emphysema   Diabetes Paternal Grandmother    Cancer Paternal Grandfather      Social History   Socioeconomic History   Marital status: Married    Spouse name: Cy   Number of children: 2   Years of education: 12   Highest education level: Associate degree:  occupational, scientist, product/process development, or vocational program  Occupational History    Employer: [employer]     Comment: [employer]  Tobacco Use   Smoking status: Former    Current packs/day: 0.00    Average packs/day: 1 pack/day for 36.5 years (36.5 ttl pk-yrs)    Types: Cigarettes    Start date: 69    Quit date: 11/01/2015    Years since quitting: 8.4    Passive exposure: Past   Smokeless tobacco: Never  Vaping Use   Vaping status: Never Used  Substance and Sexual Activity   Alcohol use: Yes    Alcohol/week: 2.0 standard drinks of alcohol    Types: 2 Standard drinks or equivalent per week    Comment: occasionally   Drug use: No   Sexual activity: Not on file  Other Topics Concern   Not on file  Social History Narrative   Married father of 2, Grandfather of 3. Wife's name is Cy.   He works as a passenger transport manager for [employer] Does not exercise because he does not have time.   Currently smokes a pack a day, and drinks maybe 2-3 alcohol beverages a week.         Epworth Sleepiness Scale      Score: 9      --I seem to be losing  my sex drive   --I feel stressed and lack motivation   --I have COPD   --I have been told that I snore   --I am overweight or am gaining weight   --I awake feeling not rested   Social Drivers of Health   Tobacco Use: Medium Risk (04/28/2024)   Patient History    Smoking Tobacco Use: Former    Smokeless Tobacco Use: Never    Passive Exposure: Past  Physicist, Medical Strain: Low Risk (06/05/2023)   Overall Financial Resource Strain (CARDIA)    Difficulty of Paying Living Expenses: Not hard at all  Food Insecurity: No Food Insecurity (06/05/2023)   Hunger Vital Sign    Worried About Running Out of Food in the Last Year: Never true    Ran Out of Food in the Last Year: Never true  Transportation Needs: No Transportation Needs (06/05/2023)   PRAPARE - Administrator, Civil Service (Medical): No    Lack of Transportation (Non-Medical): No   Physical Activity: Inactive (06/05/2023)   Exercise Vital Sign    Days of Exercise per Week: 0 days    Minutes of Exercise per Session: 0 min  Stress: Stress Concern Present (06/05/2023)   [employer] of Occupational Health - Occupational Stress Questionnaire    Feeling of Stress : Rather much  Social Connections: Unknown (06/05/2023)   Social Connection and Isolation Panel    Frequency of Communication with Friends and Family: Once a week    Frequency of Social Gatherings with Friends and Family: Patient declined    Attends Religious Services: More than 4 times per year    Active Member of Clubs or Organizations: Patient declined    Attends Banker Meetings: Patient declined    Marital Status: Married  Catering Manager Violence: Not At Risk (06/06/2023)   Humiliation, Afraid, Rape, and Kick questionnaire    Fear of Current or Ex-Partner: No    Emotionally Abused: No    Physically Abused: No    Sexually Abused: No  Depression (PHQ2-9): Low Risk (06/06/2023)   Depression (PHQ2-9)    PHQ-2 Score: 0  Alcohol Screen: Low Risk (06/05/2023)   Alcohol Screen    Last Alcohol Screening Score (AUDIT): 1  Housing: Low Risk (06/05/2023)   Housing Stability Vital Sign    Unable to Pay for Housing in the Last Year: No    Number of Times Moved in the Last Year: 0    Homeless in the Last Year: No  Utilities: Not At Risk (06/06/2023)   AHC Utilities    Threatened with loss of utilities: No  Health Literacy: Adequate Health Literacy (06/06/2023)   B1300 Health Literacy    Frequency of need for help with medical instructions: Never  [employer]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 18 g 5   cetirizine (ZYRTEC) 10 MG tablet Take 10 mg by mouth daily.     diazepam  (VALIUM ) 10 MG tablet TAKE 1 TABLET (10 MG TOTAL) BY MOUTH AT BEDTIME AS NEEDED FOR ANXIETY  90 tablet 0   diclofenac  Sodium (VOLTAREN ) 1 % GEL Apply topically 4 (four) times daily.     famotidine  (PEPCID ) 40 MG tablet TAKE 1 TABLET BY MOUTH EVERYDAY AT BEDTIME 90 tablet 1   fluticasone  (FLONASE ) 50 MCG/ACT nasal spray SPRAY 2 SPRAYS INTO EACH NOSTRIL EVERY DAY 48 mL 1   pantoprazole  (PROTONIX ) 40 MG tablet Take 1 tablet (40 mg total) by mouth daily before breakfast.     rizatriptan (MAXALT) 10 MG tablet Take 10 mg by mouth as needed.     sildenafil  (VIAGRA ) 100 MG tablet Take 1 tablet (100 mg total) by mouth as needed for erectile dysfunction. 30 tablet 4   silodosin  (RAPAFLO ) 8 MG CAPS capsule Take 1 capsule (8 mg total) by mouth at bedtime. 30 capsule 11   tamsulosin  (FLOMAX ) 0.4 MG CAPS capsule Take 1 capsule (0.4 mg total) by mouth in the morning and at bedtime. 180 capsule 3   topiramate (TOPAMAX) 100 MG tablet Take 100 mg by mouth at bedtime.     cyclobenzaprine (FLEXERIL) 10 MG tablet Take 10 mg by mouth daily. (Patient not taking: Reported on 04/28/2024)     budeson-glycopyrrolate-formoterol (BREZTRI  AEROSPHERE) 160-9-4.8 MCG/ACT AERO Inhale 2 puffs into the lungs in the morning and at bedtime. 10.7 g 6   No facility-administered medications prior to visit.         Objective:   Physical Exam Vitals:   04/28/24 1248  BP: 112/76  Pulse: 73  SpO2: 98%  Weight: 189 lb 6.4 oz (85.9 kg)  Height: 5' 10 (1.778 m)    Gen: Pleasant, well-nourished, in no distress,  normal affect  ENT: No lesions,  mouth clear,  oropharynx clear, no postnasal drip  Neck: No JVD, no stridor  Lungs: No use of accessory muscles, distant, no crackles or wheezing on normal respiration, no wheezing  on forced expiration  Cardiovascular: RRR, heart sounds normal, no murmur or gallops, no peripheral edema  Musculoskeletal: No deformities, no cyanosis or clubbing  Neuro: alert, awake, non focal  Skin: Warm, no lesions or rash       Assessment & Plan:  COPD with emphysema (HCC) Keep your albuterol  available to use 2 puffs when needed for shortness of breath, chest tightness, wheezing. Depending on how your symptoms do and how your functional capacity does as we go forward we may decide to retry a scheduled everyday inhaler medication to see if we can help your breathing. Follow Dr. Shelah in 03/2025.  Please call if you have any problems so we can see you sooner.  Allergic rhinitis He did benefit from the fluticasone  nasal spray and the Zyrtec.  He is not on it currently because his symptoms have abated somewhat but he does still have a daily cough.  I have asked him to get back on these medications on a schedule if symptoms increase.  We also talked about the fact that he will likely need to do so during the spring and fall months.  Pulmonary nodules Following with serial imaging, now via the lung cancer screening program.  He quit smoking 9 years ago.  Next scan to be done in 03/2025.  We reviewed your CT scan of the chest today. Repeat lung cancer screening CT chest in 03/2025.  I personally spent a total of 32 minutes in the care of the patient today including preparing to see the patient, getting/reviewing separately obtained history, performing a medically appropriate exam/evaluation, counseling and educating, documenting clinical information in the EHR,  independently interpreting results, and communicating results.    Lamar Chris, MD, PhD 04/28/2024, 1:23 PM [facility] [phone] or if no answer [phone]  "

## 2024-04-28 NOTE — Assessment & Plan Note (Signed)
 Keep your albuterol  available to use 2 puffs when needed for shortness of breath, chest tightness, wheezing. Depending on how your symptoms do and how your functional capacity does as we go forward we may decide to retry a scheduled everyday inhaler medication to see if we can help your breathing. Follow Dr. Shelah in 03/2025.  Please call if you have any problems so we can see you sooner.

## 2024-04-28 NOTE — Assessment & Plan Note (Signed)
 He did benefit from the fluticasone  nasal spray and the Zyrtec.  He is not on it currently because his symptoms have abated somewhat but he does still have a daily cough.  I have asked him to get back on these medications on a schedule if symptoms increase.  We also talked about the fact that he will likely need to do so during the spring and fall months.

## 2024-04-28 NOTE — Patient Instructions (Signed)
 We reviewed your CT scan of the chest today. Repeat lung cancer screening CT chest in 03/2025. Keep your albuterol  available to use 2 puffs when needed for shortness of breath, chest tightness, wheezing. Depending on how your symptoms do and how your functional capacity does as we go forward we may decide to retry a scheduled everyday inhaler medication to see if we can help your breathing. Would consider getting back on your Zyrtec and your fluticasone  nasal spray every day if develop increased allergy symptoms.  You may need to do this in the spring and fall months. Follow Dr. Shelah in 03/2025.  Please call if you have any problems so we can see you sooner.

## 2024-06-11 ENCOUNTER — Ambulatory Visit: Payer: HMO

## 2025-02-28 ENCOUNTER — Ambulatory Visit: Admitting: Urology
